# Patient Record
Sex: Male | Born: 1990 | Race: White | Hispanic: No | Smoking: Current every day smoker
Health system: Southern US, Community
[De-identification: ages and names within clinical notes are randomized; demographics above are authoritative.]

## PROBLEM LIST (undated history)

## (undated) DIAGNOSIS — D649 Anemia, unspecified: Secondary | ICD-10-CM

## (undated) DIAGNOSIS — I10 Essential (primary) hypertension: Secondary | ICD-10-CM

## (undated) DIAGNOSIS — L732 Hidradenitis suppurativa: Secondary | ICD-10-CM

## (undated) HISTORY — DX: Hidradenitis suppurativa: L73.2

## (undated) HISTORY — PX: OTHER SURGICAL HISTORY: SHX169

## (undated) HISTORY — PX: VASECTOMY: SHX75

## (undated) MED FILL — Iron Sucrose Inj 20 MG/ML (Fe Equiv): INTRAVENOUS | Qty: 10 | Status: AC

---

## 2016-03-06 ENCOUNTER — Encounter: Payer: Self-pay | Admitting: Emergency Medicine

## 2016-03-06 ENCOUNTER — Emergency Department: Payer: Self-pay

## 2016-03-06 ENCOUNTER — Emergency Department
Admission: EM | Admit: 2016-03-06 | Discharge: 2016-03-06 | Disposition: A | Discharge status: Home / Self Care | Payer: Self-pay | Attending: Emergency Medicine | Admitting: Emergency Medicine

## 2016-03-06 DIAGNOSIS — I10 Essential (primary) hypertension: Secondary | ICD-10-CM | POA: Insufficient documentation

## 2016-03-06 DIAGNOSIS — R1013 Epigastric pain: Secondary | ICD-10-CM | POA: Insufficient documentation

## 2016-03-06 DIAGNOSIS — R109 Unspecified abdominal pain: Secondary | ICD-10-CM

## 2016-03-06 DIAGNOSIS — F17210 Nicotine dependence, cigarettes, uncomplicated: Secondary | ICD-10-CM | POA: Insufficient documentation

## 2016-03-06 HISTORY — DX: Essential (primary) hypertension: I10

## 2016-03-06 LAB — COMPREHENSIVE METABOLIC PANEL
ALBUMIN: 3.9 g/dL (ref 3.5–5.0)
ALT: 18 U/L (ref 17–63)
ANION GAP: 5 (ref 5–15)
AST: 45 U/L — AB (ref 15–41)
Alkaline Phosphatase: 39 U/L (ref 38–126)
BILIRUBIN TOTAL: 0.3 mg/dL (ref 0.3–1.2)
BUN: 14 mg/dL (ref 6–20)
CO2: 28 mmol/L (ref 22–32)
Calcium: 10 mg/dL (ref 8.9–10.3)
Chloride: 104 mmol/L (ref 101–111)
Creatinine, Ser: 1.1 mg/dL (ref 0.61–1.24)
GFR calc Af Amer: >60 mL/min (ref >60)
GFR calc non Af Amer: >60 mL/min (ref >60)
GLUCOSE: 103 mg/dL — AB (ref 65–99)
POTASSIUM: 4.4 mmol/L (ref 3.5–5.1)
SODIUM: 137 mmol/L (ref 135–145)
Total Protein: 7.6 g/dL (ref 6.5–8.1)

## 2016-03-06 LAB — CBC
HEMATOCRIT: 40.4 % (ref 40.0–52.0)
HEMOGLOBIN: 13.2 g/dL (ref 13.0–18.0)
MCH: 23.6 pg — AB (ref 26.0–34.0)
MCHC: 32.7 g/dL (ref 32.0–36.0)
MCV: 72.2 fL — AB (ref 80.0–100.0)
Platelets: 265 10*3/uL (ref 150–440)
RBC: 5.6 MIL/uL (ref 4.40–5.90)
RDW: 18.2 % — AB (ref 11.5–14.5)
WBC: 9.1 10*3/uL (ref 3.8–10.6)

## 2016-03-06 LAB — LIPASE, BLOOD: Lipase: 26 U/L (ref 11–51)

## 2016-03-06 LAB — TROPONIN I: Troponin I: <0.03 ng/mL (ref <0.031)

## 2016-03-06 MED ORDER — FAMOTIDINE 20 MG PO TABS
40.0000 mg | ORAL_TABLET | Freq: Once | ORAL | Status: AC
  Administered 2016-03-06: 40 mg via ORAL
  Filled 2016-03-06: qty 2

## 2016-03-06 MED ORDER — IBUPROFEN 600 MG PO TABS
600.0000 mg | ORAL_TABLET | ORAL | Status: AC
  Administered 2016-03-06: 600 mg via ORAL
  Filled 2016-03-06: qty 1

## 2016-03-06 MED ORDER — ACETAMINOPHEN 500 MG PO TABS
1000.0000 mg | ORAL_TABLET | ORAL | Status: AC
  Administered 2016-03-06: 1000 mg via ORAL
  Filled 2016-03-06: qty 2

## 2016-03-06 NOTE — ED Notes (Signed)
Was driving to gas station this am, had a sharp epigastric pain, does have gallbladder, states he has been diagnosed with GERD in the past, this pain is different.

## 2016-03-06 NOTE — Discharge Instructions (Signed)
Please start taking 20 mg of over-the-counter Nexium daily for the next 2 weeks.  You were seen in the emergency room for abdominal pain. It is important that you follow up closely with your primary care doctor or gastroentrology in the next couple of days.   Please return to the emergency room right away if you are to develop a fever, severe nausea, your pain becomes severe or worsens, you are unable to keep food down, begin vomiting any dark or bloody fluid, you develop any dark or bloody stools, feel dehydrated, or other new concerns or symptoms arise.   Abdominal Pain, Adult Many things can cause abdominal pain. Usually, abdominal pain is not caused by a disease and will improve without treatment. It can often be observed and treated at home. Your health care provider will do a physical exam and possibly order blood tests and X-rays to help determine the seriousness of your pain. However, in many cases, more time must pass before a clear cause of the pain can be found. Before that point, your health care provider may not know if you need more testing or further treatment. HOME CARE INSTRUCTIONS Monitor your abdominal pain for any changes. The following actions may help to alleviate any discomfort you are experiencing:  Only take over-the-counter or prescription medicines as directed by your health care provider.  Do not take laxatives unless directed to do so by your health care provider.  Try a clear liquid diet (broth, tea, or water) as directed by your health care provider. Slowly move to a bland diet as tolerated. SEEK MEDICAL CARE IF:  You have unexplained abdominal pain.  You have abdominal pain associated with nausea or diarrhea.  You have pain when you urinate or have a bowel movement.  You experience abdominal pain that wakes you in the night.  You have abdominal pain that is worsened or improved by eating food.  You have abdominal pain that is worsened with eating fatty  foods.  You have a fever. SEEK IMMEDIATE MEDICAL CARE IF:  Your pain does not go away within 2 hours.  You keep throwing up (vomiting).  Your pain is felt only in portions of the abdomen, such as the right side or the left lower portion of the abdomen.  You pass bloody or black tarry stools. MAKE SURE YOU:  Understand these instructions.  Will watch your condition.  Will get help right away if you are not doing well or get worse.   This information is not intended to replace advice given to you by your health care provider. Make sure you discuss any questions you have with your health care provider.   Document Released: 07/14/2005 Document Revised: 06/25/2015 Document Reviewed: 06/13/2013 Elsevier Interactive Patient Education [employer]2016 [employer]vier Inc.

## 2016-03-06 NOTE — ED Notes (Signed)
Discussed discharge instructions and follow-up care with patient. No questions or concerns at this time. Pt stable at discharge.  

## 2016-03-06 NOTE — ED Notes (Signed)
States epigastric pain x 1 month, intermittent, denies vomiting, states worse if he eats.

## 2016-03-06 NOTE — ED Provider Notes (Signed)
[facility] Emergency Department Provider Note  ____________________________________________  Time seen: Approximately 2:38 PM  I have reviewed the triage vital signs and the nursing notes.   HISTORY  Chief Complaint Abdominal Pain    HPI Justin Powers is a 25 y.o. male denies previous medical condition other than a history of previous hypertension.  For about the last month he is noticed pain in his upper abdomen, that will come and go and usually lasts for about an hour or 2. He seems to think the pain is associated more with eating, but sometimes will car was at work without eating. Does not radiate. No fevers or chills. No nausea or vomiting. Normal bowel movements.  Today states he was driving, when the pain came back he describes a fairly sharp pain just in the upper midportion of the abdomen a little bit under the right side of the ribs. Pain is currently gone away, however he does report at this time he has a mild headache and states he gets headaches from time to time over the last several months to years and would usually use Tylenol or ibuprofen for this. This is nothing new or of concern.   Past Medical History  Diagnosis Date  . Hypertension     There are no active problems to display for this patient.   History reviewed. No pertinent past surgical history.  No current outpatient prescriptions on file.  Allergies Review of patient's allergies indicates no known allergies.  No family history on file.  Social History Social History  Substance Use Topics  . Smoking status: Current Every Day Smoker -- 0.50 packs/day    Types: Cigarettes  . Smokeless tobacco: None  . Alcohol Use: No    Review of Systems Constitutional: No fever/chills Eyes: No visual changes. ENT: No sore throat. Cardiovascular: Denies chest pain. Respiratory: Denies shortness of breath. Gastrointestinal: No nausea, no vomiting.  No diarrhea.  No  constipation. Genitourinary: Negative for dysuria. Musculoskeletal: Negative for back pain. Skin: Negative for rash. Neurological: Negative for focal weakness or numbness.  No testicular pain or groin pain.  10-point ROS otherwise negative.  ____________________________________________   PHYSICAL EXAM:  VITAL SIGNS: ED Triage Vitals  Enc Vitals Group     BP 03/06/16 1229 142/87 mmHg     Pulse Rate 03/06/16 1229 85     Resp 03/06/16 1229 20     Temp 03/06/16 1229 98.3 F (36.8 C)     Temp Source 03/06/16 1229 Oral     SpO2 03/06/16 1229 98 %     Weight 03/06/16 1229 170 lb (77.111 kg)     Height 03/06/16 1229  (1.702 m)     Head Cir --      Peak Flow --      Pain Score 03/06/16 1231 4     Pain Loc --      Pain Edu? --      Excl. in GC? --    Constitutional: Alert and oriented. Well appearing and in no acute distress. Eyes: Conjunctivae are normal. PERRL. EOMI. Head: Atraumatic. Nose: No congestion/rhinnorhea. Mouth/Throat: Mucous membranes are moist.  Oropharynx non-erythematous. Neck: No stridor.   Cardiovascular: Normal rate, regular rhythm. Grossly normal heart sounds.  Good peripheral circulation. Respiratory: Normal respiratory effort.  No retractions. Lungs CTAB. Gastrointestinal: Soft and nontender. Negative Murphy. No distention. No abdominal bruits. No CVA tenderness. Musculoskeletal: No lower extremity tenderness nor edema.  No joint effusions. Neurologic:  Normal speech and language. No  gross focal neurologic deficits are appreciated. Skin:  Skin is warm, dry and intact. No rash noted. Psychiatric: Mood and affect are normal. Speech and behavior are normal.  ____________________________________________   LABS (all labs ordered are listed, but only abnormal results are displayed)  Labs Reviewed  COMPREHENSIVE METABOLIC PANEL - Abnormal; Notable for the following:    Glucose, Bld 103 (*)    AST 45 (*)    All other components within normal limits   CBC - Abnormal; Notable for the following:    MCV 72.2 (*)    MCH 23.6 (*)    RDW 18.2 (*)    All other components within normal limits  LIPASE, BLOOD  TROPONIN I   ____________________________________________  EKG  ED ECG REPORT I, QUALE, MARK, the attending physician, personally viewed and interpreted this ECG.  Date: 03/06/2016 EKG Time: 1240 Rate: 90 Rhythm: normal sinus rhythm QRS Axis: normal Intervals: normal ST/T Wave abnormalities: normal Conduction Disturbances: none Narrative Interpretation: unremarkable  ____________________________________________  RADIOLOGY  US Abdomen Limited RUQ (Final result) Result time: 03/06/16 15:19:10   Final result by Rad Results In Interface (03/06/16 15:19:10)   Narrative:   CLINICAL DATA: Mid epigastric and right upper quadrant pain for 1 month  EXAM: US ABDOMEN LIMITED - RIGHT UPPER QUADRANT  COMPARISON: None.  FINDINGS: Gallbladder:  No gallstones or wall thickening visualized. No sonographic Murphy sign noted by sonographer.  Common bile duct:  Diameter: 3.1 mm  Liver:  No focal lesion identified. Within normal limits in parenchymal echogenicity.  IMPRESSION: Normal. No cause for pain identified.   Electronically Signed By: Gerome Samavid Williams III M.D On: 03/06/2016 15:19     ____________________________________________   PROCEDURES  Procedure(s) performed: None  Critical Care performed: No  ____________________________________________   INITIAL IMPRESSION / ASSESSMENT AND PLAN / ED COURSE  Pertinent labs & imaging results that were available during my care of the patient were reviewed by me and considered in my medical decision making (see chart for details).  Differential diagnosis includes but is not limited to, abdominal perforation, aortic dissection, cholecystitis, appendicitis, diverticulitis, colitis, esophagitis/gastritis, kidney stone, pyelonephritis, urinary tract  infection, aortic aneurysm. All are considered in decision and treatment plan. Based upon the patient's presentation and risk factors, and the patient's complete relief of abdominal symptoms at this time with reassuring exam we'll proceed with ultrasound of the right upper quadrant to evaluate for possible biliary or pancreatic disease. Biliary colic certainly high my differential, but certainly also other causes such as pancreatitis, esophageal reflux are all strongly considered. There is no evidence for obvious acute peritonitis, surgical abdomen, or cause for concern for appendicitis as there is no right lower quadrant discomfort, fever, leukocytosis, or peritonitis on exam. Patient denies and has no cardiac or pulmonary symptoms.  ----------------------------------------- 3:44 PM on 03/06/2016 -----------------------------------------  Patient resting comfortably with no concerns at this time.  Return precautions and treatment recommendations and follow-up discussed with the patient who is agreeable with the plan.   ____________________________________________   FINAL CLINICAL IMPRESSION(S) / ED DIAGNOSES  Final diagnoses:  Abdominal pain  Epigastric abdominal pain      Sharyn CreamerMark Quale, MD 03/06/16 1544

## 2018-02-28 ENCOUNTER — Emergency Department
Admission: EM | Admit: 2018-02-28 | Discharge: 2018-02-28 | Disposition: A | Discharge status: Home / Self Care | Payer: 59 | Attending: Emergency Medicine | Admitting: Emergency Medicine

## 2018-02-28 ENCOUNTER — Other Ambulatory Visit: Payer: Self-pay

## 2018-02-28 ENCOUNTER — Encounter: Payer: Self-pay | Admitting: Emergency Medicine

## 2018-02-28 DIAGNOSIS — J029 Acute pharyngitis, unspecified: Secondary | ICD-10-CM | POA: Diagnosis not present

## 2018-02-28 DIAGNOSIS — F17210 Nicotine dependence, cigarettes, uncomplicated: Secondary | ICD-10-CM | POA: Insufficient documentation

## 2018-02-28 DIAGNOSIS — I10 Essential (primary) hypertension: Secondary | ICD-10-CM | POA: Insufficient documentation

## 2018-02-28 DIAGNOSIS — R070 Pain in throat: Secondary | ICD-10-CM | POA: Diagnosis present

## 2018-02-28 LAB — GROUP A STREP BY PCR: Group A Strep by PCR: NOT DETECTED

## 2018-02-28 MED ORDER — LIDOCAINE VISCOUS HCL 2 % MT SOLN: 10.0000 mL | OROMUCOSAL | 0 refills | Status: DC | PRN

## 2018-02-28 NOTE — ED Provider Notes (Signed)
[facility] Emergency Department Provider Note  ____________________________________________  Time seen: Approximately 1:19 PM  I have reviewed the triage vital signs and the nursing notes.   HISTORY  Chief Complaint Sore Throat    HPI Justin Powers is a 27 y.o. male that presents to the emergency department for evaluation of sore throat for 1 day.  Pain is worse on the right side.  Patient has had chills but unsure of fever.  No sick contacts.  No nausea, vomiting, abdominal pain.   Past Medical History:  Diagnosis Date  . Hypertension     There are no active problems to display for this patient.   History reviewed. No pertinent surgical history.  Prior to Admission medications   Medication Sig Start Date End Date Taking? Authorizing Provider  lidocaine (XYLOCAINE) 2 % solution Use as directed 10 mLs in the mouth or throat as needed for mouth pain. 02/28/18   Enid Derry, PA-C    Allergies Patient has no known allergies.  No family history on file.  Social History Social History   Tobacco Use  . Smoking status: Current Every Day Smoker    Packs/day: 0.50    Types: Cigarettes  . Smokeless tobacco: Never Used  Substance Use Topics  . Alcohol use: No  . Drug use: Not on file     Review of Systems ENT: Negative for congestion and rhinorrhea. Cardiovascular: No chest pain. Respiratory:  No SOB. Gastrointestinal: No abdominal pain.  No nausea, no vomiting.  Musculoskeletal: Negative for musculoskeletal pain. Skin: Negative for rash, abrasions, lacerations, ecchymosis. Neurological: Negative for headaches.   ____________________________________________   PHYSICAL EXAM:  VITAL SIGNS: ED Triage Vitals  Enc Vitals Group     BP 02/28/18 1210 124/82     Pulse Rate 02/28/18 1210 90     Resp 02/28/18 1210 18     Temp 02/28/18 1210 98.6 F (37 C)     Temp Source 02/28/18 1210 Oral     SpO2 02/28/18 1210 98 %     Weight 02/28/18  1208 170 lb (77.1 kg)     Height 02/28/18 1208  (1.702 m)     Head Circumference --      Peak Flow --      Pain Score 02/28/18 1207 5     Pain Loc --      Pain Edu? --      Excl. in GC? --      Constitutional: Alert and oriented. Well appearing and in no acute distress. Eyes: Conjunctivae are normal. PERRL. EOMI. No discharge. Head: Atraumatic. ENT: No frontal and maxillary sinus tenderness.      Ears: Tympanic membranes pearly gray with good landmarks. No discharge.      Nose: No congestion/rhinnorhea.      Mouth/Throat: Mucous membranes are moist. Oropharynx erythematous. Tonsils not enlarged. No exudates. Uvula midline. Neck: No stridor.   Hematological/Lymphatic/Immunilogical: No cervical lymphadenopathy. Cardiovascular: Normal rate, regular rhythm.  Good peripheral circulation. Respiratory: Normal respiratory effort without tachypnea or retractions. Lungs CTAB. Good air entry to the bases with no decreased or absent breath sounds. Gastrointestinal: Bowel sounds 4 quadrants. Soft and nontender to palpation. No guarding or rigidity. No palpable masses. No distention. Musculoskeletal: Full range of motion to all extremities. No gross deformities appreciated. Neurologic:  Normal speech and language. No gross focal neurologic deficits are appreciated.  Skin:  Skin is warm, dry and intact. No rash noted. Psychiatric: Mood and affect are normal. Speech and behavior are  normal. Patient exhibits appropriate insight and judgement.   ____________________________________________   LABS (all labs ordered are listed, but only abnormal results are displayed)  Labs Reviewed  GROUP A STREP BY PCR   ____________________________________________  EKG   ____________________________________________  RADIOLOGY   No results found.  ____________________________________________    PROCEDURES  Procedure(s) performed:    Procedures    Medications - No data to  display   ____________________________________________   INITIAL IMPRESSION / ASSESSMENT AND PLAN / ED COURSE  Pertinent labs & imaging results that were available during my care of the patient were reviewed by me and considered in my medical decision making (see chart for details).  Review of the [place] CSRS was performed in accordance of the [facility] prior to dispensing any controlled drugs.   Patient's diagnosis is consistent with viral pharyngitis. Vital signs and exam are reassuring.  Strep test is negative.  Patient appears well and is staying well hydrated. Patient feels comfortable going home. Patient will be discharged home with prescriptions for viscous lidocaine. . Patient is to follow up with PCP as needed or otherwise directed. Patient is given ED precautions to return to the ED for any worsening or new symptoms.     ____________________________________________  FINAL CLINICAL IMPRESSION(S) / ED DIAGNOSES  Final diagnoses:  Viral pharyngitis      NEW MEDICATIONS STARTED DURING THIS VISIT:  ED Discharge Orders        Ordered    lidocaine (XYLOCAINE) 2 % solution  As needed     02/28/18 1353          This chart was dictated using voice recognition software/Dragon. Despite best efforts to proofread, errors can occur which can change the meaning. Any change was purely unintentional.    Enid Derry, PA-C 02/28/18 1548    Minna Antis, MD 03/01/18 1102

## 2018-02-28 NOTE — ED Notes (Signed)
See triage note  Developed sore throat yesterday  Having increased pain with swallowing  Denies any fever at home and is afebrile on arrival

## 2018-02-28 NOTE — ED Triage Notes (Signed)
Sore throat since yesterday with white spots on back of throat per pt. Has hx of strep.

## 2018-05-21 ENCOUNTER — Encounter: Payer: Self-pay | Admitting: Emergency Medicine

## 2018-05-21 ENCOUNTER — Emergency Department: Payer: 59

## 2018-05-21 ENCOUNTER — Emergency Department
Admission: EM | Admit: 2018-05-21 | Discharge: 2018-05-21 | Disposition: A | Discharge status: Home / Self Care | Payer: 59 | Attending: Emergency Medicine | Admitting: Emergency Medicine

## 2018-05-21 DIAGNOSIS — I10 Essential (primary) hypertension: Secondary | ICD-10-CM | POA: Diagnosis not present

## 2018-05-21 DIAGNOSIS — F17210 Nicotine dependence, cigarettes, uncomplicated: Secondary | ICD-10-CM | POA: Diagnosis not present

## 2018-05-21 DIAGNOSIS — M545 Low back pain, unspecified: Secondary | ICD-10-CM

## 2018-05-21 MED ORDER — HYDROCODONE-ACETAMINOPHEN 5-325 MG PO TABS: 1.0000 | ORAL_TABLET | Freq: Four times a day (QID) | ORAL | 0 refills | Status: AC | PRN

## 2018-05-21 MED ORDER — HYDROCODONE-ACETAMINOPHEN 5-325 MG PO TABS
1.0000 | ORAL_TABLET | Freq: Once | ORAL | Status: AC
  Administered 2018-05-21: 1 via ORAL
  Filled 2018-05-21: qty 1

## 2018-05-21 MED ORDER — CYCLOBENZAPRINE HCL 10 MG PO TABS: 10.0000 mg | ORAL_TABLET | Freq: Three times a day (TID) | ORAL | 0 refills | Status: DC | PRN

## 2018-05-21 MED ORDER — PREDNISONE 10 MG (21) PO TBPK: ORAL_TABLET | ORAL | 0 refills | Status: DC

## 2018-05-21 NOTE — ED Triage Notes (Signed)
C/O right lower back pain x 2 weeks.

## 2018-05-21 NOTE — ED Notes (Signed)
Pt c/o mid back pain radiating down through both hips states he has had the pain for about 2 weeks, pt has tried goody powder, massage, tylenol, CBD lotions but no improvement.  Pt [place] 8[place]20 [place], but states the pain is too bad he cannot wait.

## 2018-05-21 NOTE — ED Notes (Signed)
Pt transported to XR.  

## 2018-05-21 NOTE — Discharge Instructions (Addendum)
Please follow up with Dr. Lucila Maineramm as scheduled. Return to the ER for symptoms that change or worsen if unable to schedule an appointment.

## 2018-05-21 NOTE — ED Provider Notes (Signed)
[facility] Emergency Department Provider Note ____________________________________________  Time seen: Approximately 3:07 PM  I have reviewed the triage vital signs and the nursing notes.  HISTORY  Chief Complaint Back Pain   HPI Vanessa RalphsRobert Fredrick is a 27 y.o. male who presents to the emergency department for treatment and evaluation of back pain x 2 weeks. No relief with OTC medications, massage. He has an appointment with Dr. Lucila Maineramm on June 06, 2018 but pain is too bad to wait. He has "always had some back pain" but this is different because it radiates into his left hip and buttock.  Past Medical History:  Diagnosis Date  . Hypertension     There are no active problems to display for this patient.   History reviewed. No pertinent surgical history.  Prior to Admission medications   Medication Sig Start Date End Date Taking? Authorizing Provider  cyclobenzaprine (FLEXERIL) 10 MG tablet Take 1 tablet (10 mg total) by mouth 3 (three) times daily as needed for muscle spasms. 05/21/18   Clemence Stillings, Rulon Eisenmengerari B, FNP  HYDROcodone-acetaminophen (NORCO/VICODIN) 5-325 MG tablet Take 1 tablet by mouth every 6 (six) hours as needed for up to 3 days for severe pain. 05/21/18 05/24/18  Rami Waddle, Rulon Eisenmengerari B, FNP  lidocaine (XYLOCAINE) 2 % solution Use as directed 10 mLs in the mouth or throat as needed for mouth pain. 02/28/18   Enid DerryWagner, Ashley, PA-C  predniSONE (STERAPRED UNI-PAK 21 TAB) 10 MG (21) TBPK tablet Take 6 tablets on the first day and decrease by 1 tablet each day until finished. 05/21/18   Chinita Pesterriplett, Lochlan Grygiel B, FNP    Allergies Patient has no known allergies.  No family history on file.  Social History Social History   Tobacco Use  . Smoking status: Current Every Day Smoker    Packs/day: 0.50    Types: Cigarettes  . Smokeless tobacco: Never Used  Substance Use Topics  . Alcohol use: No  . Drug use: Not on file    Review of Systems Constitutional: Well  appearing. Respiratory: Negative for dyspnea. Cardiovascular: Negative for change in skin temperature or color. Musculoskeletal:   Negative for chronic steroid use   Negative for trauma in the presence of osteoporosis  Negative for age over 6450 and trauma.  Negative for constitutional symptoms, or history of cancer.  Negative for pain worse at night. Skin: Negative for rash, lesion, or wound.  Genitourinary: Negative for urinary retention. Rectal: Negative for fecal incontinence or new onset constipation/bowel habit changes. Hematological/Immunilogical: Negative for immunosuppression, IV drug use, or fever Neurological: Negative for burning, tingling, numb, electric, radiating pain in the right or left leg.                        Negative for saddle anesthesia.                        Negative for focal neurologic deficit, progressive or disabling symptoms             Negative for saddle anesthesia. ____________________________________________   PHYSICAL EXAM:  VITAL SIGNS: ED Triage Vitals  Enc Vitals Group     BP 05/21/18 1425 127/75     Pulse Rate 05/21/18 1425 60     Resp 05/21/18 1425 18     Temp 05/21/18 1425 99.6 F (37.6 C)     Temp Source 05/21/18 1425 Oral     SpO2 05/21/18 1425 100 %  Weight 05/21/18 1415 170 lb (77.1 kg)     Height 05/21/18 1425 5\' 8"  (1.727 m)     Head Circumference --      Peak Flow --      Pain Score 05/21/18 1415 6     Pain Loc --      Pain Edu? --      Excl. in GC? --     Constitutional: Alert and oriented. Well appearing and in no acute distress. Eyes: Conjunctivae are clear without discharge or drainage.  Head: Atraumatic. Neck: Full, active range of motion. Respiratory: Respirations even and unlabored. Musculoskeletal: Limited ROM of the back and extremities, Pain increases with flexion of the lumbar spine. Strength 5/5 of the lower extremities as tested. Neurologic: Reflexes of the lower extremities are 2+. Negative straight leg  raise on the right and left side. Skin: Atraumatic.  Psychiatric: Behavior and affect are normal.  ____________________________________________   LABS (all labs ordered are listed, but only abnormal results are displayed)  Labs Reviewed - No data to display ____________________________________________  RADIOLOGY  No acute bony abnormality of the lumbar spine per radiology. ____________________________________________   PROCEDURES  Procedure(s) performed:  Procedures ____________________________________________   INITIAL IMPRESSION / ASSESSMENT AND PLAN / ED COURSE  Jayten Gabbard is a 27 y.o. male who presents to the emergency department for evaluation of low back pain. No relief with OTC NSAIDs. Will give Norco and get lumbar image.   ----------------------------------------- 4:10 PM on 05/21/2018 ----------------------------------------- X-ray is reassuring.  Patient will be treated with prednisone, Norco, and Flexeril.  He is to keep his appointment with neurology as scheduled.  He is to return to the emergency department for symptoms of change or worsen if he is unable to schedule an appointment with his primary care provider or get an earlier appointment with a specialist.   Medications  HYDROcodone-acetaminophen (NORCO/VICODIN) 5-325 MG per tablet 1 tablet (1 tablet Oral Given 05/21/18 1554)    ED Discharge Orders        Ordered    predniSONE (STERAPRED UNI-PAK 21 TAB) 10 MG (21) TBPK tablet     05/21/18 1612    HYDROcodone-acetaminophen (NORCO/VICODIN) 5-325 MG tablet  Every 6 hours PRN     05/21/18 1612    cyclobenzaprine (FLEXERIL) 10 MG tablet  3 times daily PRN     05/21/18 1612       Pertinent labs & imaging results that were available during my care of the patient were reviewed by me and considered in my medical decision making (see chart for details).  _________________________________________   FINAL CLINICAL IMPRESSION(S) / ED DIAGNOSES  Final  diagnoses:  Acute lumbar back pain  Acute left-sided low back pain without sciatica     If controlled substance prescribed during this visit, 12 month history viewed on the [facility] prior to issuing an initial prescription for Schedule II or III opiod.    Chinita Pester, FNP 05/21/18 1649    Phineas Semen, MD 05/21/18 [phone]

## 2018-08-29 ENCOUNTER — Other Ambulatory Visit: Payer: Self-pay

## 2018-08-29 ENCOUNTER — Emergency Department: Payer: 59

## 2018-08-29 ENCOUNTER — Encounter: Payer: Self-pay | Admitting: Emergency Medicine

## 2018-08-29 DIAGNOSIS — D649 Anemia, unspecified: Secondary | ICD-10-CM | POA: Diagnosis not present

## 2018-08-29 DIAGNOSIS — R079 Chest pain, unspecified: Secondary | ICD-10-CM | POA: Insufficient documentation

## 2018-08-29 DIAGNOSIS — I10 Essential (primary) hypertension: Secondary | ICD-10-CM | POA: Insufficient documentation

## 2018-08-29 DIAGNOSIS — F17210 Nicotine dependence, cigarettes, uncomplicated: Secondary | ICD-10-CM | POA: Insufficient documentation

## 2018-08-29 LAB — CBC
HEMATOCRIT: 35.9 % — AB (ref 39.0–52.0)
Hemoglobin: 10.6 g/dL — ABNORMAL LOW (ref 13.0–17.0)
MCH: 19.7 pg — AB (ref 26.0–34.0)
MCHC: 29.5 g/dL — AB (ref 30.0–36.0)
MCV: 66.6 fL — AB (ref 80.0–100.0)
Platelets: 331 10*3/uL (ref 150–400)
RBC: 5.39 MIL/uL (ref 4.22–5.81)
RDW: 20.6 % — ABNORMAL HIGH (ref 11.5–15.5)
WBC: 11.8 10*3/uL — ABNORMAL HIGH (ref 4.0–10.5)
nRBC: 0 % (ref 0.0–0.2)

## 2018-08-29 LAB — BASIC METABOLIC PANEL
Anion gap: 11 (ref 5–15)
BUN: 16 mg/dL (ref 6–20)
CHLORIDE: 106 mmol/L (ref 98–111)
CO2: 23 mmol/L (ref 22–32)
CREATININE: 1.01 mg/dL (ref 0.61–1.24)
Calcium: 9.6 mg/dL (ref 8.9–10.3)
GFR calc Af Amer: >60 mL/min (ref >60)
GFR calc non Af Amer: >60 mL/min (ref >60)
Glucose, Bld: 117 mg/dL — ABNORMAL HIGH (ref 70–99)
Potassium: 3.1 mmol/L — ABNORMAL LOW (ref 3.5–5.1)
SODIUM: 140 mmol/L (ref 135–145)

## 2018-08-29 LAB — TROPONIN I: Troponin I: <0.03 ng/mL (ref <0.03)

## 2018-08-29 NOTE — ED Triage Notes (Signed)
Pt presents to ED with sudden onset of mid chest pressure with sob while walking around at work. Pt reports hx of similar symptoms several times in the past month but pt has not been evaluated by an MD. Denies hx of asthma. No recent illness.

## 2018-08-30 ENCOUNTER — Emergency Department
Admission: EM | Admit: 2018-08-30 | Discharge: 2018-08-30 | Disposition: A | Discharge status: Home / Self Care | Payer: 59 | Attending: Emergency Medicine | Admitting: Emergency Medicine

## 2018-08-30 ENCOUNTER — Telehealth: Payer: Self-pay

## 2018-08-30 DIAGNOSIS — D649 Anemia, unspecified: Secondary | ICD-10-CM

## 2018-08-30 DIAGNOSIS — R079 Chest pain, unspecified: Secondary | ICD-10-CM

## 2018-08-30 MED ORDER — POTASSIUM CHLORIDE 20 MEQ PO PACK
40.0000 meq | PACK | Freq: Once | ORAL | Status: AC
  Administered 2018-08-30: 40 meq via ORAL
  Filled 2018-08-30: qty 2

## 2018-08-30 MED ORDER — FERROUS SULFATE 325 (65 FE) MG PO TBEC: 325.0000 mg | DELAYED_RELEASE_TABLET | Freq: Every day | ORAL | 0 refills | Status: DC

## 2018-08-30 NOTE — Telephone Encounter (Signed)
Lmov for patient to call and schedule ED fu  Seen on 08/29/18  Will try again at a later time

## 2018-08-30 NOTE — ED Provider Notes (Signed)
[facility] Emergency Department Provider Note    First MD Initiated Contact with Patient 08/30/18 [phone]     (approximate)  I have reviewed the triage vital signs and the nursing notes.   HISTORY  Chief Complaint Chest Pain and Shortness of Breath    HPI Justin Powers is a 27 y.o. male presents to the emergency department with episode of upper "chest tightness"which occurred while the patient was at work tonight.patient states that tightness was associated with dyspnea. Patient said he has had similar episodes while at work in the past month. Patient denies any personal familial history of CAD or PE/DVT. 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 Patient denies any discomfort are present.   Past Medical History:  Diagnosis Date  . Hypertension     There are no active problems to display for this patient.   past surgical history None  Prior to Admission medications   Medication Sig Start Date End Date Taking? Authorizing Provider  cyclobenzaprine (FLEXERIL) 10 MG tablet Take 1 tablet (10 mg total) by mouth 3 (three) times daily as needed for muscle spasms. 05/21/18   Triplett, Rulon Eisenmenger B, FNP  ferrous sulfate 325 (65 FE) MG EC tablet Take 1 tablet (325 mg total) by mouth daily with breakfast. 08/30/18 08/30/19  Darci Current, MD  lidocaine (XYLOCAINE) 2 % solution Use as directed 10 mLs in the mouth or throat as needed for mouth pain. 02/28/18   Enid Derry, PA-C  predniSONE (STERAPRED UNI-PAK 21 TAB) 10 MG (21) TBPK tablet Take 6 tablets on the first day and decrease by 1 tablet each day until finished. 05/21/18   Triplett, Rulon Eisenmenger B, FNP    Allergies no known drug allergies No family history on file.  Social History Social History   Tobacco Use  . Smoking status: Current Every Day Smoker    Packs/day: 0.50    Types: Cigarettes  . Smokeless tobacco: Never Used  Substance Use Topics  .  Alcohol use: No  . Drug use: Never    Review of Systems Constitutional: No fever/chills Eyes: No visual changes. ENT: No sore throat. Cardiovascular: Denies chest pain. Respiratory: Denies shortness of breath. Gastrointestinal: No abdominal pain.  No nausea, no vomiting.  No diarrhea.  No constipation. Genitourinary: Negative for dysuria. Musculoskeletal: Negative for neck pain.  Negative for back pain. Integumentary: Negative for rash. Neurological: Negative for headaches, focal weakness or numbness.   ____________________________________________   PHYSICAL EXAM:  VITAL SIGNS: ED Triage Vitals  Enc Vitals Group     BP 08/29/18 2225 (!) 141/80     Pulse Rate 08/29/18 2225 76     Resp 08/29/18 2225 (!) 22     Temp 08/29/18 2225 97.8 F (36.6 C)     Temp Source 08/29/18 2225 Oral     SpO2 08/29/18 2225 100 %     Weight 08/29/18 2226 76.2 kg (168 lb)     Height 08/29/18 2226 1.702 m (5\' 7" )     Head Circumference --      Peak Flow --      Pain Score 08/29/18 2225 6     Pain Loc --      Pain Edu? --      Excl. in GC? --     Constitutional: Alert and oriented. Well appearing and in no acute distress. Eyes: Conjunctivae are normal.  Head: Atraumatic. Mouth/Throat: Mucous membranes are moist.  Oropharynx non-erythematous. Neck:  No stridor.   Cardiovascular: Normal rate, regular rhythm. Good peripheral circulation. Grossly normal heart sounds. Respiratory: Normal respiratory effort.  No retractions. Lungs CTAB. Gastrointestinal: Soft and nontender. No distention.  Musculoskeletal: No lower extremity tenderness nor edema. No gross deformities of extremities. Neurologic:  Normal speech and language. No gross focal neurologic deficits are appreciated.  Skin:  Skin is warm, dry and intact. No rash noted. Psychiatric: Mood and affect are normal. Speech and behavior are normal.  ____________________________________________   LABS (all labs ordered are listed, but only  abnormal results are displayed)  Labs Reviewed  BASIC METABOLIC PANEL - Abnormal; Notable for the following components:      Result Value   Potassium 3.1 (*)    Glucose, Bld 117 (*)    All other components within normal limits  CBC - Abnormal; Notable for the following components:   WBC 11.8 (*)    Hemoglobin 10.6 (*)    HCT 35.9 (*)    MCV 66.6 (*)    MCH 19.7 (*)    MCHC 29.5 (*)    RDW 20.6 (*)    All other components within normal limits  TROPONIN I   ____________________________________________  EKG  ED ECG REPORT I, [place] N BROWN, the attending physician, personally viewed and interpreted this ECG.   Date: 08/30/2018  EKG Time: 10:39 PM  Rate: 74  Rhythm: Normal sinus rhythm  Axis: normal  Intervals:normalQTC 404  ST&T Change: none  ____________________________________________  RADIOLOGY I, [place] N BROWN, personally viewed and evaluated these images (plain radiographs) as part of my medical decision making, as well as reviewing the written report by the radiologist.  ED MD interpretation:  no active cardiopulmonary disease noted on chest x-ray per radiologist.  Official radiology report(s): Dg Chest 2 View  Result Date: 08/29/2018 CLINICAL DATA:  Chest pain EXAM: CHEST - 2 VIEW COMPARISON:  None. FINDINGS: There is increased thoracic kyphosis with mild multilevel thoracic vertebral body height loss. Lungs are clear. No pleural effusion or pneumothorax. No focal airspace consolidation or pulmonary edema. IMPRESSION: No active cardiopulmonary disease. Electronically Signed   By: Deatra RobinsonKevin  Herman M.D.   On: 08/29/2018 23:01    ____________________________________________   Procedures   ____________________________________________   INITIAL IMPRESSION / ASSESSMENT AND PLAN / ED COURSE  As part of my medical decision making, I reviewed the following data within the electronic MEDICAL RECORD NUMBER  27 year old male presenting with above stated history of  physical exam secondary to upper chest tightness that is resolved. Patient states had multiple episodes while at work over the past month. Considered possibly of ACS EKG revealed no evidence of ischemia or infarction laboratory data including troponin negative. Patient with no clear risk factors for PE. Laboratory data notable for anemia with a hemoglobin of 10.6 hematocrit 35.9 MCV of 66.6 and a such concern for possible iron deficiency anemia. Patient denies any bright red blood or melenic stools. In addition patient potassium noted to be 3.1 and a such patient given 40 mEq of potassium chloride. Patient will be described iron for home with recommendation follow-up with primary care provider for further outpatient evaluation of anemia. ____________________________________________  FINAL CLINICAL IMPRESSION(S) / ED DIAGNOSES  Final diagnoses:  Chest pain, unspecified type  Anemia, unspecified type     MEDICATIONS GIVEN DURING THIS VISIT:  Medications  potassium chloride (KLOR-CON) packet 40 mEq (40 mEq Oral Given 08/30/18 0141)     ED Discharge Orders         Ordered  ferrous sulfate 325 (65 FE) MG EC tablet  Daily with breakfast,   Status:  Discontinued     08/30/18 0146    ferrous sulfate 325 (65 FE) MG EC tablet  Daily with breakfast     08/30/18 0146           Note:  This document was prepared using Dragon voice recognition software and may include unintentional dictation errors.    Darci Current, MD 08/30/18 [phone]

## 2018-09-04 NOTE — Telephone Encounter (Signed)
Patient declined at this time due to not being cardiac related but will call us back if needing to be seen in the fututre.

## 2018-09-19 ENCOUNTER — Other Ambulatory Visit: Payer: Self-pay

## 2018-09-19 ENCOUNTER — Inpatient Hospital Stay: Payer: 59 | Attending: Oncology | Admitting: Oncology

## 2018-09-19 ENCOUNTER — Encounter: Payer: Self-pay | Admitting: Oncology

## 2018-09-19 ENCOUNTER — Inpatient Hospital Stay: Payer: 59

## 2018-09-19 VITALS — BP 132/80 | HR 74 | Temp 96.9°F | Resp 18 | Ht 67.0 in | Wt 169.1 lb

## 2018-09-19 DIAGNOSIS — R531 Weakness: Secondary | ICD-10-CM | POA: Diagnosis not present

## 2018-09-19 DIAGNOSIS — R51 Headache: Secondary | ICD-10-CM | POA: Insufficient documentation

## 2018-09-19 DIAGNOSIS — K921 Melena: Secondary | ICD-10-CM | POA: Diagnosis not present

## 2018-09-19 DIAGNOSIS — D649 Anemia, unspecified: Secondary | ICD-10-CM

## 2018-09-19 DIAGNOSIS — R5383 Other fatigue: Secondary | ICD-10-CM

## 2018-09-19 DIAGNOSIS — D509 Iron deficiency anemia, unspecified: Secondary | ICD-10-CM

## 2018-09-19 DIAGNOSIS — F17210 Nicotine dependence, cigarettes, uncomplicated: Secondary | ICD-10-CM | POA: Insufficient documentation

## 2018-09-19 DIAGNOSIS — D500 Iron deficiency anemia secondary to blood loss (chronic): Secondary | ICD-10-CM | POA: Insufficient documentation

## 2018-09-19 DIAGNOSIS — I10 Essential (primary) hypertension: Secondary | ICD-10-CM | POA: Diagnosis not present

## 2018-09-19 DIAGNOSIS — Z888 Allergy status to other drugs, medicaments and biological substances status: Secondary | ICD-10-CM | POA: Diagnosis not present

## 2018-09-19 DIAGNOSIS — D72824 Basophilia: Secondary | ICD-10-CM | POA: Diagnosis not present

## 2018-09-19 DIAGNOSIS — R110 Nausea: Secondary | ICD-10-CM | POA: Diagnosis not present

## 2018-09-19 DIAGNOSIS — R0609 Other forms of dyspnea: Secondary | ICD-10-CM | POA: Insufficient documentation

## 2018-09-19 DIAGNOSIS — Z79899 Other long term (current) drug therapy: Secondary | ICD-10-CM | POA: Insufficient documentation

## 2018-09-19 LAB — LACTATE DEHYDROGENASE: LDH: 103 U/L (ref 98–192)

## 2018-09-19 LAB — CBC WITH DIFFERENTIAL/PLATELET
Abs Immature Granulocytes: 0.06 10*3/uL (ref 0.00–0.07)
BASOS ABS: 0.1 10*3/uL (ref 0.0–0.1)
Basophils Relative: 1 %
EOS PCT: 1 %
Eosinophils Absolute: 0.1 10*3/uL (ref 0.0–0.5)
HCT: 40.8 % (ref 39.0–52.0)
Hemoglobin: 12.1 g/dL — ABNORMAL LOW (ref 13.0–17.0)
Immature Granulocytes: 1 %
LYMPHS PCT: 20 %
Lymphs Abs: 1.8 10*3/uL (ref 0.7–4.0)
MCH: 21.1 pg — AB (ref 26.0–34.0)
MCHC: 29.7 g/dL — ABNORMAL LOW (ref 30.0–36.0)
MCV: 71.2 fL — ABNORMAL LOW (ref 80.0–100.0)
Monocytes Absolute: 0.6 10*3/uL (ref 0.1–1.0)
Monocytes Relative: 6 %
Neutro Abs: 6.7 10*3/uL (ref 1.7–7.7)
Neutrophils Relative %: 71 %
Platelets: 346 10*3/uL (ref 150–400)
RBC: 5.73 MIL/uL (ref 4.22–5.81)
RDW: 23.6 % — ABNORMAL HIGH (ref 11.5–15.5)
Smear Review: NORMAL
WBC: 9.3 10*3/uL (ref 4.0–10.5)
nRBC: 0 % (ref 0.0–0.2)

## 2018-09-19 LAB — RETIC PANEL
Immature Retic Fract: 16.2 % — ABNORMAL HIGH (ref 2.3–15.9)
RBC.: 5.73 MIL/uL (ref 4.22–5.81)
Retic Count, Absolute: 43.5 10*3/uL (ref 19.0–186.0)
Retic Ct Pct: 0.8 % (ref 0.4–3.1)
Reticulocyte Hemoglobin: 22.8 pg — ABNORMAL LOW (ref >27.9)

## 2018-09-19 LAB — IRON AND TIBC
Iron: 19 ug/dL — ABNORMAL LOW (ref 45–182)
Saturation Ratios: 3 % — ABNORMAL LOW (ref 17.9–39.5)
TIBC: 591 ug/dL — ABNORMAL HIGH (ref 250–450)
UIBC: 572 ug/dL

## 2018-09-19 LAB — FERRITIN: Ferritin: 4 ng/mL — ABNORMAL LOW (ref 24–336)

## 2018-09-19 NOTE — Progress Notes (Signed)
Patient here for initial visit. Pt has loose stools and nausea in the mornings.

## 2018-09-19 NOTE — Progress Notes (Signed)
Hematology/Oncology Consult note [facility] Telephone:([phone] Fax:([phone]) [phone]   Patient Care Team: Franciso Bend, NP as PCP - General (Nurse Practitioner)  REFERRING PROVIDER: Franciso Bend, NP CHIEF COMPLAINTS/REASON FOR VISIT:  Evaluation of anemia  HISTORY OF PRESENTING ILLNESS:  Justin Powers is a  27 y.o.  male with PMH listed below who was referred to me for evaluation of anemia Patient recently presented to ED after an episode of "upper chest tightness". Reports having multiple episodes for the past few months.  In ED, he had negative cardiology work up including negative EKG, troponin, hemoglobin was noted to be low at 10.6, microcytic.  He was sent home with presumed diagnosis of iron deficiency anemia and prescribed with iron supplements.  Also reports fatigue, progressively worsened lately.  He followed up with PCP and had additional work up done.  Reviewed patient's recent labs that was done at [facility] office. Labs revealed anemia with hemoglobin of 12, MCV 68, platelet count 378,000,  Differential showed increased basophils,  Iron panel showed saturation of 68%, iron 363, TIBC 537, normal TSH Reviewed patient's previous labs ordered by primary care physician's office, anemia is chronic onset , duration is since xxxx No aggravating or improving factors.  Associated signs and symptoms: Patient reports fatigue.  SOB with moderate exertion.  Denies weight loss, easy bruising, hematochezia, hemoptysis, hematuria. Context: History of GI bleeding: deneis . Review of Systems  Constitutional: Positive for fatigue. Negative for appetite change, chills, fever and unexpected weight change.  HENT:   Negative for hearing loss and voice change.   Eyes: Negative for eye problems and icterus.  Respiratory: Positive for shortness of breath. Negative for chest tightness and cough.   Cardiovascular: Negative for chest pain and leg swelling.    Gastrointestinal: Negative for abdominal distention and abdominal pain.  Endocrine: Negative for hot flashes.  Genitourinary: Negative for difficulty urinating, dysuria and frequency.   Musculoskeletal: Negative for arthralgias.  Skin: Negative for itching and rash.  Neurological: Negative for light-headedness and numbness.  Hematological: Negative for adenopathy. Does not bruise/bleed easily.  Psychiatric/Behavioral: Negative for confusion.    MEDICAL HISTORY:  Past Medical History:  Diagnosis Date  . Hypertension     SURGICAL HISTORY: Past Surgical History:  Procedure Laterality Date  . wisdon tooth removal      SOCIAL HISTORY: Social History   Socioeconomic History  . Marital status: Married    Spouse name: Not on file  . Number of children: Not on file  . Years of education: Not on file  . Highest education level: Not on file  Occupational History  . Not on file  Social Needs  . Financial resource strain: Not on file  . Food insecurity:    Worry: Not on file    Inability: Not on file  . Transportation needs:    Medical: Not on file    Non-medical: Not on file  Tobacco Use  . Smoking status: Current Every Day Smoker    Packs/day: 1.00    Types: Cigarettes  . Smokeless tobacco: Never Used  Substance and Sexual Activity  . Alcohol use: Yes  . Drug use: Never  . Sexual activity: Not on file  Lifestyle  . Physical activity:    Days per week: Not on file    Minutes per session: Not on file  . Stress: Not on file  Relationships  . Social connections:    Talks on phone: Not on file    Gets together:  Not on file    Attends religious service: Not on file    Active member of club or organization: Not on file    Attends meetings of clubs or organizations: Not on file    Relationship status: Not on file  . Intimate partner violence:    Fear of current or ex partner: Not on file    Emotionally abused: Not on file    Physically abused: Not on file    Forced  sexual activity: Not on file  Other Topics Concern  . Not on file  Social History Narrative  . Not on file    FAMILY HISTORY: Family History  Problem Relation Age of Onset  . Crohn's disease Mother   . Hypertension Mother   . Breast cancer Maternal Aunt   . Bladder Cancer Maternal Grandfather   . Cervical cancer Other   . Non-Hodgkin's lymphoma Other     ALLERGIES:  has No Known Allergies.  MEDICATIONS:  Current Outpatient Medications  Medication Sig Dispense Refill  . CHANTIX CONTINUING MONTH PAK 1 MG tablet See admin instructions.  2  . cyclobenzaprine (FLEXERIL) 10 MG tablet Take 1 tablet (10 mg total) by mouth 3 (three) times daily as needed for muscle spasms. 30 tablet 0  . ferrous sulfate 325 (65 FE) MG EC tablet Take 1 tablet (325 mg total) by mouth daily with breakfast. (Patient not taking: Reported on 09/19/2018) 60 tablet 0  . lidocaine (XYLOCAINE) 2 % solution Use as directed 10 mLs in the mouth or throat as needed for mouth pain. (Patient not taking: Reported on 09/19/2018) 100 mL 0  . predniSONE (STERAPRED UNI-PAK 21 TAB) 10 MG (21) TBPK tablet Take 6 tablets on the first day and decrease by 1 tablet each day until finished. (Patient not taking: Reported on 09/19/2018) 21 tablet 0   No current facility-administered medications for this visit.      PHYSICAL EXAMINATION: ECOG PERFORMANCE STATUS: 1 - Symptomatic but completely ambulatory Vitals:   09/19/18 1151  BP: 132/80  Pulse: 74  Resp: 18  Temp: (!) 96.9 F (36.1 C)   Filed Weights   09/19/18 1151  Weight: 169 lb 1.6 oz (76.7 kg)    Physical Exam  Constitutional: He is oriented to person, place, and time. No distress.  HENT:  Head: Normocephalic and atraumatic.  Mouth/Throat: Oropharynx is clear and moist.  Eyes: Pupils are equal, round, and reactive to light. EOM are normal. No scleral icterus.  Neck: Normal range of motion. Neck supple.  Cardiovascular: Normal rate, regular rhythm and normal heart  sounds.  Pulmonary/Chest: Effort normal. No respiratory distress. He has no wheezes.  Abdominal: Soft. Bowel sounds are normal. He exhibits no distension and no mass. There is no tenderness.  Musculoskeletal: Normal range of motion. He exhibits no edema or deformity.  Neurological: He is alert and oriented to person, place, and time. No cranial nerve deficit. Coordination normal.  Skin: Skin is warm and dry. No rash noted. No erythema. There is pallor.  Psychiatric: He has a normal mood and affect.     LABORATORY DATA:  I have reviewed the data as listed Lab Results  Component Value Date   WBC 9.3 09/19/2018   HGB 12.1 (L) 09/19/2018   HCT 40.8 09/19/2018   MCV 71.2 (L) 09/19/2018   PLT 346 09/19/2018   Recent Labs    08/29/18 2239  NA 140  K 3.1*  CL 106  CO2 23  GLUCOSE 117*  BUN 16  CREATININE 1.01  CALCIUM 9.6  GFRNONAA >60  GFRAA >60   Iron/TIBC/Ferritin/ %Sat    Component Value Date/Time   IRON 19 (L) 09/19/2018 1240   TIBC 591 (H) 09/19/2018 1240   FERRITIN 4 (L) 09/19/2018 1240   IRONPCTSAT 3 (L) 09/19/2018 1240        ASSESSMENT & PLAN:  1. Iron deficiency anemia, unspecified iron deficiency anemia type   2. Basophilia    Discussed with patient and his mother that possible causes of microcytic anemia including iron deficiency, hemoglobinopathy, etc. Will check CBC w differential, CMP,   retic panel, blood smear,  LDH, hemoglobinopathy evaluation. Iron panel done at PCP's office showed hight TIBC, also high saturation. ? Not explainable. Repeat iron/TIBC, ferritin,   Labs done at PCP's office showed neutrophilia, and Basophilia, decreased lymphocyte %, check flowcytometry.   Orders Placed This Encounter  Procedures  . CBC with Differential/Platelet    Standing Status:   Future    Number of Occurrences:   1    Standing Expiration Date:   09/20/2019  . Retic Panel    Standing Status:   Future    Number of Occurrences:   1    Standing Expiration  Date:   09/20/2019  . Iron and TIBC    Standing Status:   Future    Number of Occurrences:   1    Standing Expiration Date:   09/20/2019  . Ferritin    Standing Status:   Future    Number of Occurrences:   1    Standing Expiration Date:   09/20/2019  . BCR-ABL1 FISH    Standing Status:   Future    Number of Occurrences:   1    Standing Expiration Date:   09/20/2019  . Flow cytometry panel-leukemia/lymphoma work-up    Standing Status:   Future    Number of Occurrences:   1    Standing Expiration Date:   09/20/2019  . Lactate dehydrogenase    Standing Status:   Future    Number of Occurrences:   1    Standing Expiration Date:   09/20/2019  . Hemoglobinopathy evaluation    Standing Status:   Future    Number of Occurrences:   1    Standing Expiration Date:   09/20/2019    All questions were answered. The patient knows to call the clinic with any problems questions or concerns.  Return of visit: 2 weeks Thank you for this kind referral and the opportunity to participate in the care of this patient. A copy of today's note is routed to referring provider  Total face to face encounter time for this patient visit was . >50% of the time was  spent in counseling and coordination of care.    Rickard Patience, MD, PhD Hematology Oncology [facility] Pager- [phone] 09/19/2018

## 2018-09-20 LAB — HEMOGLOBINOPATHY EVALUATION
HGB A: 98.5 % (ref 96.4–98.8)
Hgb A2 Quant: 1.5 % — ABNORMAL LOW (ref 1.8–3.2)
Hgb C: 0 %
Hgb F Quant: 0 % (ref 0.0–2.0)
Hgb S Quant: 0 %
Hgb Variant: 0 %

## 2018-09-21 LAB — COMP PANEL: LEUKEMIA/LYMPHOMA: CLINICAL INFO: 3

## 2018-09-25 ENCOUNTER — Telehealth: Payer: Self-pay | Admitting: *Deleted

## 2018-09-25 NOTE — Telephone Encounter (Signed)
Per Dr Cathie HoopsYu, hold oral iron will give IV iron at next visit, does not feel ha is related to oral iron,still awaiting results. I attempted to contact patient and his voice mail is full and I could not leave a msg

## 2018-09-25 NOTE — Telephone Encounter (Signed)
Patient called reporting that he is having a headache and wants Dr Cathie HoopsYu to treat it. He states he is awaiting results and a future appointment and thought that since it is worse since getting the iron infusion that Dr Cathie HoopsYu would treat him instead of him having to call his pcp for it Please advise

## 2018-09-25 NOTE — Telephone Encounter (Signed)
Patient returned missed call and advised that he can stop the oral iron. He repeated this back to me

## 2018-09-25 NOTE — Telephone Encounter (Signed)
Patient is NOT on IV Iron, he takes oral iron.   Patient is not on any treatment by our office

## 2018-10-02 LAB — BCR-ABL1 FISH
CELLS COUNTED: 200
Cells Analyzed: 200

## 2018-10-04 ENCOUNTER — Inpatient Hospital Stay: Payer: 59

## 2018-10-04 ENCOUNTER — Inpatient Hospital Stay (HOSPITAL_BASED_OUTPATIENT_CLINIC_OR_DEPARTMENT_OTHER): Payer: 59 | Admitting: Oncology

## 2018-10-04 ENCOUNTER — Other Ambulatory Visit: Payer: Self-pay

## 2018-10-04 ENCOUNTER — Encounter: Payer: Self-pay | Admitting: Oncology

## 2018-10-04 VITALS — BP 140/84 | HR 80 | Temp 96.4°F | Resp 18 | Wt 171.9 lb

## 2018-10-04 DIAGNOSIS — I10 Essential (primary) hypertension: Secondary | ICD-10-CM

## 2018-10-04 DIAGNOSIS — Z888 Allergy status to other drugs, medicaments and biological substances status: Secondary | ICD-10-CM

## 2018-10-04 DIAGNOSIS — R51 Headache: Secondary | ICD-10-CM

## 2018-10-04 DIAGNOSIS — R519 Headache, unspecified: Secondary | ICD-10-CM

## 2018-10-04 DIAGNOSIS — R110 Nausea: Secondary | ICD-10-CM

## 2018-10-04 DIAGNOSIS — R0609 Other forms of dyspnea: Secondary | ICD-10-CM

## 2018-10-04 DIAGNOSIS — K922 Gastrointestinal hemorrhage, unspecified: Secondary | ICD-10-CM

## 2018-10-04 DIAGNOSIS — D500 Iron deficiency anemia secondary to blood loss (chronic): Secondary | ICD-10-CM

## 2018-10-04 DIAGNOSIS — Z79899 Other long term (current) drug therapy: Secondary | ICD-10-CM

## 2018-10-04 DIAGNOSIS — D72824 Basophilia: Secondary | ICD-10-CM | POA: Diagnosis not present

## 2018-10-04 DIAGNOSIS — R531 Weakness: Secondary | ICD-10-CM

## 2018-10-04 DIAGNOSIS — D649 Anemia, unspecified: Secondary | ICD-10-CM

## 2018-10-04 DIAGNOSIS — R5383 Other fatigue: Secondary | ICD-10-CM

## 2018-10-04 DIAGNOSIS — R5382 Chronic fatigue, unspecified: Secondary | ICD-10-CM

## 2018-10-04 DIAGNOSIS — F17210 Nicotine dependence, cigarettes, uncomplicated: Secondary | ICD-10-CM

## 2018-10-04 LAB — CBC WITH DIFFERENTIAL/PLATELET
Abs Immature Granulocytes: 0.02 10*3/uL (ref 0.00–0.07)
Basophils Absolute: 0.1 10*3/uL (ref 0.0–0.1)
Basophils Relative: 1 %
Eosinophils Absolute: 0.3 10*3/uL (ref 0.0–0.5)
Eosinophils Relative: 4 %
HCT: 42.4 % (ref 39.0–52.0)
Hemoglobin: 12.4 g/dL — ABNORMAL LOW (ref 13.0–17.0)
Immature Granulocytes: 0 %
Lymphocytes Relative: 25 %
Lymphs Abs: 1.8 10*3/uL (ref 0.7–4.0)
MCH: 21.5 pg — ABNORMAL LOW (ref 26.0–34.0)
MCHC: 29.2 g/dL — ABNORMAL LOW (ref 30.0–36.0)
MCV: 73.6 fL — ABNORMAL LOW (ref 80.0–100.0)
MONOS PCT: 7 %
Monocytes Absolute: 0.5 10*3/uL (ref 0.1–1.0)
NEUTROS PCT: 63 %
Neutro Abs: 4.6 10*3/uL (ref 1.7–7.7)
Platelets: 234 10*3/uL (ref 150–400)
RBC: 5.76 MIL/uL (ref 4.22–5.81)
RDW: 25 % — ABNORMAL HIGH (ref 11.5–15.5)
WBC: 7.3 10*3/uL (ref 4.0–10.5)
nRBC: 0 % (ref 0.0–0.2)

## 2018-10-04 LAB — SAMPLE TO BLOOD BANK

## 2018-10-04 NOTE — Progress Notes (Signed)
Hematology/Oncology follow up note [facility] [phone] Fax:([phone]) [phone]   Patient Care Team: Franciso Bend, NP as PCP - General (Nurse Practitioner)  REFERRING PROVIDER: Franciso Bend, NP CHIEF COMPLAINTS/REASON FOR VISIT:  Evaluation of anemia  HISTORY OF PRESENTING ILLNESS:  Justin Powers is a  27 y.o.  male with PMH listed below who was referred to me for evaluation of anemia Patient recently presented to ED after an episode of "upper chest tightness". Reports having multiple episodes for the past few months.  In ED, he had negative cardiology work up including negative EKG, troponin, hemoglobin was noted to be low at 10.6, microcytic.  He was sent home with presumed diagnosis of iron deficiency anemia and prescribed with iron supplements.  Also reports fatigue, progressively worsened lately.  He followed up with PCP and had additional work up done.  Reviewed patient's recent labs that was done at [facility] office. Labs revealed anemia with hemoglobin of 12, MCV 68, platelet count 378,000,  Differential showed increased basophils,  Iron panel showed saturation of 68%, iron 363, TIBC 537, normal TSH Reviewed patient's previous labs ordered by primary care physician's office, anemia is chronic onset , duration is since  No aggravating or improving factors.   INTERVAL HISTORY Justin Powers is a 27 y.o. male who has above history reviewed by me today presents for follow up visit for management of anemia.   During the interval, he was advised to take oral iron supplementation. He called and reports not able to tolerate oral iron supplements. He is advised to stop iron.  Reports feeling nauseated and also have seen blood in his stool.  Fatigue: reports worsening fatigue. Chronic onset, perisistent, worsened recently.  no associated symptoms.  Also has chronic intermittent left side headache, he used to take Tylenol and NSAIDs. For the  past month, he has had stomach discomfort, nauseated, he has stopped using NSAIDs, and only uses Tylenol as needed.  Accompanied by mother.   Review of Systems  Constitutional: Positive for fatigue. Negative for appetite change, chills, fever and unexpected weight change.  HENT:   Negative for hearing loss and voice change.   Eyes: Negative for eye problems and icterus.  Respiratory: Positive for shortness of breath. Negative for chest tightness and cough.   Cardiovascular: Negative for chest pain and leg swelling.  Gastrointestinal: Positive for abdominal pain and blood in stool. Negative for abdominal distention.  Endocrine: Negative for hot flashes.  Genitourinary: Negative for difficulty urinating, dysuria and frequency.   Musculoskeletal: Negative for arthralgias.  Skin: Negative for itching and rash.  Neurological: Positive for headaches. Negative for light-headedness and numbness.  Hematological: Negative for adenopathy. Does not bruise/bleed easily.  Psychiatric/Behavioral: Negative for confusion.    MEDICAL HISTORY:  Past Medical History:  Diagnosis Date  . Hypertension     SURGICAL HISTORY: Past Surgical History:  Procedure Laterality Date  . wisdon tooth removal      SOCIAL HISTORY: Social History   Socioeconomic History  . Marital status: Married    Spouse name: Not on file  . Number of children: Not on file  . Years of education: Not on file  . Highest education level: Not on file  Occupational History  . Not on file  Social Needs  . Financial resource strain: Not on file  . Food insecurity:    Worry: Not on file    Inability: Not on file  . Transportation needs:    Medical: Not on file  Non-medical: Not on file  Tobacco Use  . Smoking status: Current Every Day Smoker    Packs/day: 1.00    Types: Cigarettes  . Smokeless tobacco: Never Used  Substance and Sexual Activity  . Alcohol use: Yes  . Drug use: Never  . Sexual activity: Not on file    Lifestyle  . Physical activity:    Days per week: Not on file    Minutes per session: Not on file  . Stress: Not on file  Relationships  . Social connections:    Talks on phone: Not on file    Gets together: Not on file    Attends religious service: Not on file    Active member of club or organization: Not on file    Attends meetings of clubs or organizations: Not on file    Relationship status: Not on file  . Intimate partner violence:    Fear of current or ex partner: Not on file    Emotionally abused: Not on file    Physically abused: Not on file    Forced sexual activity: Not on file  Other Topics Concern  . Not on file  Social History Narrative  . Not on file    FAMILY HISTORY: Family History  Problem Relation Age of Onset  . Crohn's disease Mother   . Hypertension Mother   . Breast cancer Maternal Aunt   . Bladder Cancer Maternal Grandfather   . Cervical cancer Other   . Non-Hodgkin's lymphoma Other     ALLERGIES:  has No Known Allergies.  MEDICATIONS:  Current Outpatient Medications  Medication Sig Dispense Refill  . CHANTIX CONTINUING MONTH PAK 1 MG tablet See admin instructions.  2  . cyclobenzaprine (FLEXERIL) 10 MG tablet Take 1 tablet (10 mg total) by mouth 3 (three) times daily as needed for muscle spasms. 30 tablet 0  . ferrous sulfate 325 (65 FE) MG EC tablet Take 1 tablet (325 mg total) by mouth daily with breakfast. (Patient not taking: Reported on 09/19/2018) 60 tablet 0   No current facility-administered medications for this visit.      PHYSICAL EXAMINATION: ECOG PERFORMANCE STATUS: 1 - Symptomatic but completely ambulatory Vitals:   10/04/18 1003  BP: 140/84  Pulse: 80  Resp: 18  Temp: (!) 96.4 F (35.8 C)   Filed Weights   10/04/18 1003  Weight: 171 lb 14.4 oz (78 kg)    Physical Exam Constitutional:      General: He is not in acute distress. HENT:     Head: Normocephalic and atraumatic.  Eyes:     General: No scleral  icterus.    Pupils: Pupils are equal, round, and reactive to light.  Neck:     Musculoskeletal: Normal range of motion and neck supple.  Cardiovascular:     Rate and Rhythm: Normal rate and regular rhythm.     Heart sounds: Normal heart sounds.  Pulmonary:     Effort: Pulmonary effort is normal. No respiratory distress.     Breath sounds: No wheezing.  Abdominal:     General: Bowel sounds are normal. There is no distension.     Palpations: Abdomen is soft. There is no mass.     Tenderness: There is no abdominal tenderness.  Musculoskeletal: Normal range of motion.        General: No deformity.  Skin:    General: Skin is warm and dry.     Coloration: Skin is pale.     Findings: No erythema  or rash.  Neurological:     Mental Status: He is alert and oriented to person, place, and time.     Cranial Nerves: No cranial nerve deficit.     Coordination: Coordination normal.  Psychiatric:        Behavior: Behavior normal.        Thought Content: Thought content normal.      LABORATORY DATA:  I have reviewed the data as listed Lab Results  Component Value Date   WBC 7.3 10/04/2018   HGB 12.4 (L) 10/04/2018   HCT 42.4 10/04/2018   MCV 73.6 (L) 10/04/2018   PLT 234 10/04/2018   Recent Labs    08/29/18 2239  NA 140  K 3.1*  CL 106  CO2 23  GLUCOSE 117*  BUN 16  CREATININE 1.01  CALCIUM 9.6  GFRNONAA >60  GFRAA >60   Iron/TIBC/Ferritin/ %Sat    Component Value Date/Time   IRON 19 (L) 09/19/2018 1240   TIBC 591 (H) 09/19/2018 1240   FERRITIN 4 (L) 09/19/2018 1240   IRONPCTSAT 3 (L) 09/19/2018 1240        ASSESSMENT & PLAN:  1. Iron deficiency anemia due to chronic blood loss   2. Gastrointestinal hemorrhage, unspecified gastrointestinal hemorrhage type   3. Chronic fatigue   4. Nonintractable headache, unspecified chronicity pattern, unspecified headache type   labs are reviewed and discussed.  Discussed with patient and his mother that patient has low level  of iron, he also can not tolerate oral iron supplementation.  I recommend IV iron infusion.  Plan IV iron with Feraheme 510mg  weekly x 2. Allergy reactions/infusion reaction including anaphylactic reaction discussed with patient. Other side effects include but not limited to high blood pressure, skin rash, weight gain, leg swelling, etc. Patient voices understanding and willing to proceed.  # GI bleeding- blood in the stool/ stomach discomfort, refer to Dr.Anna for further evaluation.  # Profound weakness and fatigue, repeat cbc today.  # Chronic headache, unknown etiology.   Advise patient to stop using NSAIDs.   Orders Placed This Encounter  Procedures  . CBC with Differential/Platelet    Standing Status:   Future    Number of Occurrences:   1    Standing Expiration Date:   10/05/2019  . Ambulatory referral to Gastroenterology    Referral Priority:   Routine    Referral Type:   Consultation    Referral Reason:   Specialty Services Required    Referred to Provider:   Wyline MoodAnna, Kiran, MD    Number of Visits Requested:   1  . Sample to Blood Bank    Standing Status:   Future    Number of Occurrences:   1    Standing Expiration Date:   10/05/2019    All questions were answered. The patient knows to call the clinic with any problems questions or concerns.  Return of visit: 8 weeks.   Rickard PatienceZhou Allexa Acoff, MD, PhD Hematology Oncology [facility] Pager- [phone] 10/04/2018

## 2018-10-04 NOTE — Progress Notes (Signed)
Pt here for follow up. He states he has Nausea and diarrhea every morning. Has been having headaches for over a month.

## 2018-10-06 ENCOUNTER — Inpatient Hospital Stay: Payer: 59

## 2018-10-06 VITALS — BP 162/95 | HR 79 | Temp 97.2°F | Resp 18

## 2018-10-06 DIAGNOSIS — D509 Iron deficiency anemia, unspecified: Secondary | ICD-10-CM | POA: Insufficient documentation

## 2018-10-06 DIAGNOSIS — D500 Iron deficiency anemia secondary to blood loss (chronic): Secondary | ICD-10-CM | POA: Insufficient documentation

## 2018-10-06 DIAGNOSIS — Z8639 Personal history of other endocrine, nutritional and metabolic disease: Secondary | ICD-10-CM | POA: Insufficient documentation

## 2018-10-06 MED ORDER — SODIUM CHLORIDE 0.9 % IV SOLN
Freq: Once | INTRAVENOUS | Status: AC
  Administered 2018-10-06: 12:00:00 via INTRAVENOUS
  Filled 2018-10-06: qty 250

## 2018-10-06 MED ORDER — SODIUM CHLORIDE 0.9 % IV SOLN
510.0000 mg | Freq: Once | INTRAVENOUS | Status: AC
  Administered 2018-10-06: 510 mg via INTRAVENOUS
  Filled 2018-10-06: qty 17

## 2018-10-06 NOTE — Addendum Note (Signed)
Addended by: Rickard PatienceYU, Tityana Pagan on: 10/06/2018 11:38 AM   Modules accepted: Orders

## 2018-10-13 ENCOUNTER — Inpatient Hospital Stay: Payer: 59

## 2018-10-13 VITALS — BP 131/82 | HR 66 | Temp 97.2°F | Resp 18

## 2018-10-13 DIAGNOSIS — D500 Iron deficiency anemia secondary to blood loss (chronic): Secondary | ICD-10-CM | POA: Diagnosis not present

## 2018-10-13 MED ORDER — SODIUM CHLORIDE 0.9 % IV SOLN
510.0000 mg | Freq: Once | INTRAVENOUS | Status: AC
  Administered 2018-10-13: 510 mg via INTRAVENOUS
  Filled 2018-10-13: qty 17

## 2018-10-13 MED ORDER — SODIUM CHLORIDE 0.9 % IV SOLN
Freq: Once | INTRAVENOUS | Status: AC
  Administered 2018-10-13: 14:00:00 via INTRAVENOUS
  Filled 2018-10-13: qty 250

## 2018-10-30 ENCOUNTER — Ambulatory Visit: Payer: 59 | Admitting: Gastroenterology

## 2018-10-30 ENCOUNTER — Encounter: Payer: Self-pay | Admitting: Gastroenterology

## 2018-10-30 ENCOUNTER — Other Ambulatory Visit: Payer: Self-pay

## 2018-10-30 VITALS — BP 151/85 | HR 71 | Ht 67.0 in | Wt 177.6 lb

## 2018-10-30 DIAGNOSIS — G8929 Other chronic pain: Secondary | ICD-10-CM

## 2018-10-30 DIAGNOSIS — R1013 Epigastric pain: Secondary | ICD-10-CM | POA: Diagnosis not present

## 2018-10-30 DIAGNOSIS — R197 Diarrhea, unspecified: Secondary | ICD-10-CM

## 2018-10-30 DIAGNOSIS — D500 Iron deficiency anemia secondary to blood loss (chronic): Secondary | ICD-10-CM

## 2018-10-30 NOTE — Addendum Note (Signed)
Addended by: Daisy Blossom on: 10/30/2018 01:42 PM   Modules accepted: Orders

## 2018-10-30 NOTE — Progress Notes (Signed)
Justin MoodKiran Kurt Hoffmeier Powers, MRCP(U.K) [address]  [address]  [place], [place] [address]  Main: [phone]  Fax: [phone]   Gastroenterology Consultation  Referring Provider:     Rickard Powers, Justin Powers Primary Care Physician:  Justin Powers, Justin Powers Primary Gastroenterologist:  Justin Powers  Reason for Consultation:     Anemia         HPI:   Justin Powers is a 28 y.o. y/o male referred for consultation & management  by Dr. Franciso Powers, Justin Powers.     Referred by Dr Cathie HoopsYu in Hematology for anemia. Recent ER visit and was found have microcytic anemia. Ferritin of 4 . 2 years back Hb 13 grams with MCV 72.    Rectal bleeding: few times when he wiped, sometimes a few spots and at one time a clot Nose bleeds: no  Hematemesis or hemoptysis : no  Blood in urine : no   Eats meat. Used to take a lot of [employer], stopped last year. Took it daily for a few years. Some epigastric pain, usually on and off , acid reflux in the past .   Diarrhea for a year, has 3-4 bowel movements a day , semi solid- some blood.    Past Medical History:  Diagnosis Date  . Hypertension     Past Surgical History:  Procedure Laterality Date  . wisdon tooth removal      Prior to Admission medications   Medication Sig Start Date End Date Taking? Authorizing Provider  CHANTIX CONTINUING MONTH PAK 1 MG tablet See admin instructions. 09/12/18   Provider, Historical, Powers  cyclobenzaprine (FLEXERIL) 10 MG tablet Take 1 tablet (10 mg total) by mouth 3 (three) times daily as needed for muscle spasms. Patient not taking: Reported on 10/30/2018 05/21/18   Kem Boroughsriplett, Cari B, FNP  ferrous sulfate 325 (65 FE) MG EC tablet Take 1 tablet (325 mg total) by mouth daily with breakfast. Patient not taking: Reported on 09/19/2018 08/30/18 08/30/19  Darci CurrentBrown, Hughson N, Powers    Family History  Problem Relation Age of Onset  . Crohn's disease Mother   . Hypertension Mother   . Breast cancer Maternal Aunt   . Bladder Cancer Maternal  Grandfather   . Cervical cancer Other   . Non-Hodgkin's lymphoma Other      Social History   Tobacco Use  . Smoking status: Current Every Day Smoker    Packs/day: 1.00    Types: Cigarettes  . Smokeless tobacco: Never Used  Substance Use Topics  . Alcohol use: Yes  . Drug use: Never    Allergies as of 10/30/2018  . (No Known Allergies)    Review of Systems:    All systems reviewed and negative except where noted in HPI.   Physical Exam:  BP (!) 151/85   Pulse 71   Ht 5\' 7"  (1.702 m)   Wt 177 lb 9.6 oz (80.6 kg)   BMI 27.82 kg/m  No LMP for male patient. Psych:  Alert and cooperative. Normal mood and affect. General:   Alert,  Well-developed, well-nourished, pleasant and cooperative in NAD Head:  Normocephalic and atraumatic. Eyes:  Sclera clear, no icterus.   Conjunctiva pink. Ears:  Normal auditory acuity. Nose:  No deformity, discharge, or lesions. Mouth:  No deformity or lesions,oropharynx pink & moist. Neck:  Supple; no masses or thyromegaly. Lungs:  Respirations even and unlabored.  Clear throughout to auscultation.   No wheezes, crackles, or rhonchi. No acute distress. Heart:  Regular rate and rhythm; no murmurs, clicks, rubs, or gallops. Abdomen:  Normal bowel sounds.  No bruits.  Soft, non-tender and non-distended without masses, hepatosplenomegaly or hernias noted.  No guarding or rebound tenderness.    Neurologic:  Alert and oriented x3;  grossly normal neurologically. Skin:  Intact without significant lesions or rashes. No jaundice. Lymph Nodes:  No significant cervical adenopathy. Psych:  Alert and cooperative. Normal mood and affect.  Imaging Studies: No results found.  Assessment and Plan:   Justin Powers is a 28 y.o. y/o male has been referred for iron deficiency anemia- no ovcert blood loss, some NSAID use last year. Some epigastric pain ,diarrhea - need to R/o IBD- mother has crohns   Plan  1. B12,folate, celiac serology, urine analysis 2.  EGD+colonosocpy +/- capsule study of the small bowel  3. Stool studies for diarrhea- will r/o IBD with endoscopy  4. Stop NSAID use 5. Stop Smoking    I have discussed alternative options, risks & benefits,  which include, but are not limited to, bleeding, infection, perforation,respiratory complication & drug reaction.  The patient agrees with this plan & written consent will be obtained.      Follow up in 6 weeks  Dr Justin Mood Powers,MRCP(U.K)

## 2018-10-31 LAB — H. PYLORI BREATH TEST: H pylori Breath Test: NEGATIVE

## 2018-11-01 LAB — CELIAC PANEL 10
Antigliadin Abs, IgA: 5 units (ref 0–19)
Endomysial IgA: NEGATIVE
Gliadin IgG: 9 units (ref 0–19)
IgA/Immunoglobulin A, Serum: 163 mg/dL (ref 90–386)
Tissue Transglut Ab: 13 U/mL — ABNORMAL HIGH (ref 0–5)
Transglutaminase IgA: <2 U/mL (ref 0–3)

## 2018-11-01 LAB — B12 AND FOLATE PANEL
Folate: 12.4 ng/mL (ref >3.0)
Vitamin B-12: 502 pg/mL (ref 232–1245)

## 2018-11-01 LAB — URINALYSIS
Bilirubin, UA: NEGATIVE
Glucose, UA: NEGATIVE
Ketones, UA: NEGATIVE
Nitrite, UA: NEGATIVE
Protein, UA: NEGATIVE
RBC, UA: NEGATIVE
Specific Gravity, UA: 1.02 (ref 1.005–1.030)
Urobilinogen, Ur: 0.2 mg/dL (ref 0.2–1.0)
pH, UA: 5 (ref 5.0–7.5)

## 2018-11-02 ENCOUNTER — Telehealth: Payer: Self-pay

## 2018-11-02 LAB — SPECIMEN STATUS REPORT

## 2018-11-02 LAB — CLOSTRIDIUM DIFFICILE EIA: C difficile Toxins A+B, EIA: NEGATIVE

## 2018-11-02 NOTE — Telephone Encounter (Signed)
Called pt to inform him of lab results. Unable to contact. LVM to return call 

## 2018-11-02 NOTE — Telephone Encounter (Signed)
-----   Message from [name], MD sent at 11/01/2018 11:39 AM EST ----- Denton Meek inform patient celiac blood test was positive- may have celiac disease- will confirnm after EGD. C/c Dr Cathie Hoops

## 2018-11-02 NOTE — Telephone Encounter (Signed)
Spoke with pt and informed him of lab results. 

## 2018-11-03 LAB — GI PROFILE, STOOL, PCR
Adenovirus F 40/41: DETECTED — AB
Astrovirus: NOT DETECTED
C difficile toxin A/B: NOT DETECTED
Campylobacter: NOT DETECTED
Cryptosporidium: NOT DETECTED
Cyclospora cayetanensis: NOT DETECTED
Entamoeba histolytica: NOT DETECTED
Enteroaggregative E coli: NOT DETECTED
Enteropathogenic E coli: NOT DETECTED
Enterotoxigenic E coli: NOT DETECTED
Giardia lamblia: NOT DETECTED
Norovirus GI/GII: NOT DETECTED
Plesiomonas shigelloides: NOT DETECTED
Rotavirus A: NOT DETECTED
Salmonella: NOT DETECTED
Sapovirus: NOT DETECTED
Shiga-toxin-producing E coli: NOT DETECTED
Shigella/Enteroinvasive E coli: NOT DETECTED
Vibrio cholerae: NOT DETECTED
Vibrio: NOT DETECTED
Yersinia enterocolitica: NOT DETECTED

## 2018-11-03 LAB — SPECIMEN STATUS REPORT

## 2018-11-03 LAB — CALPROTECTIN, FECAL: Calprotectin, Fecal: 79 ug/g (ref 0–120)

## 2018-11-07 ENCOUNTER — Encounter: Payer: Self-pay | Admitting: Gastroenterology

## 2018-11-08 ENCOUNTER — Encounter: Payer: Self-pay | Admitting: *Deleted

## 2018-11-08 ENCOUNTER — Ambulatory Visit
Admission: RE | Admit: 2018-11-08 | Discharge: 2018-11-08 | Disposition: A | Discharge status: Home / Self Care | Payer: 59 | Attending: Gastroenterology | Admitting: Gastroenterology

## 2018-11-08 ENCOUNTER — Ambulatory Visit: Payer: 59 | Admitting: Anesthesiology

## 2018-11-08 ENCOUNTER — Encounter
Admission: RE | Disposition: A | Discharge status: Home / Self Care | Payer: Self-pay | Source: Home / Self Care | Attending: Gastroenterology

## 2018-11-08 DIAGNOSIS — K2210 Ulcer of esophagus without bleeding: Secondary | ICD-10-CM

## 2018-11-08 DIAGNOSIS — Z79899 Other long term (current) drug therapy: Secondary | ICD-10-CM | POA: Insufficient documentation

## 2018-11-08 DIAGNOSIS — Z8049 Family history of malignant neoplasm of other genital organs: Secondary | ICD-10-CM | POA: Diagnosis not present

## 2018-11-08 DIAGNOSIS — Z8379 Family history of other diseases of the digestive system: Secondary | ICD-10-CM | POA: Insufficient documentation

## 2018-11-08 DIAGNOSIS — Z803 Family history of malignant neoplasm of breast: Secondary | ICD-10-CM | POA: Diagnosis not present

## 2018-11-08 DIAGNOSIS — K210 Gastro-esophageal reflux disease with esophagitis: Secondary | ICD-10-CM | POA: Insufficient documentation

## 2018-11-08 DIAGNOSIS — K449 Diaphragmatic hernia without obstruction or gangrene: Secondary | ICD-10-CM | POA: Insufficient documentation

## 2018-11-08 DIAGNOSIS — I10 Essential (primary) hypertension: Secondary | ICD-10-CM | POA: Diagnosis not present

## 2018-11-08 DIAGNOSIS — K2980 Duodenitis without bleeding: Secondary | ICD-10-CM | POA: Diagnosis not present

## 2018-11-08 DIAGNOSIS — Z807 Family history of other malignant neoplasms of lymphoid, hematopoietic and related tissues: Secondary | ICD-10-CM | POA: Insufficient documentation

## 2018-11-08 DIAGNOSIS — R197 Diarrhea, unspecified: Secondary | ICD-10-CM

## 2018-11-08 DIAGNOSIS — K621 Rectal polyp: Secondary | ICD-10-CM | POA: Diagnosis not present

## 2018-11-08 DIAGNOSIS — Z8249 Family history of ischemic heart disease and other diseases of the circulatory system: Secondary | ICD-10-CM | POA: Diagnosis not present

## 2018-11-08 DIAGNOSIS — D500 Iron deficiency anemia secondary to blood loss (chronic): Secondary | ICD-10-CM | POA: Insufficient documentation

## 2018-11-08 DIAGNOSIS — K640 First degree hemorrhoids: Secondary | ICD-10-CM | POA: Insufficient documentation

## 2018-11-08 DIAGNOSIS — F17210 Nicotine dependence, cigarettes, uncomplicated: Secondary | ICD-10-CM | POA: Diagnosis not present

## 2018-11-08 DIAGNOSIS — Z8052 Family history of malignant neoplasm of bladder: Secondary | ICD-10-CM | POA: Insufficient documentation

## 2018-11-08 DIAGNOSIS — G8929 Other chronic pain: Secondary | ICD-10-CM

## 2018-11-08 DIAGNOSIS — R1013 Epigastric pain: Secondary | ICD-10-CM

## 2018-11-08 HISTORY — PX: COLONOSCOPY WITH PROPOFOL: SHX5780

## 2018-11-08 HISTORY — PX: ESOPHAGOGASTRODUODENOSCOPY (EGD) WITH PROPOFOL: SHX5813

## 2018-11-08 SURGERY — COLONOSCOPY WITH PROPOFOL
Anesthesia: General

## 2018-11-08 MED ORDER — SODIUM CHLORIDE 0.9 % IV SOLN
INTRAVENOUS | Status: DC
  Administered 2018-11-08: 13:00:00 via INTRAVENOUS

## 2018-11-08 MED ORDER — SUCRALFATE 1 G PO TABS: 1.0000 g | ORAL_TABLET | Freq: Four times a day (QID) | ORAL | 1 refills | Status: DC

## 2018-11-08 MED ORDER — OMEPRAZOLE 40 MG PO CPDR: 40.0000 mg | DELAYED_RELEASE_CAPSULE | Freq: Two times a day (BID) | ORAL | 1 refills | Status: DC

## 2018-11-08 MED ORDER — PROPOFOL 500 MG/50ML IV EMUL
INTRAVENOUS | Status: DC | PRN
  Administered 2018-11-08: 120 ug/kg/min via INTRAVENOUS

## 2018-11-08 MED ORDER — GLYCOPYRROLATE PF 0.2 MG/ML IJ SOSY
PREFILLED_SYRINGE | INTRAMUSCULAR | Status: DC | PRN
  Administered 2018-11-08: .2 mg via INTRAVENOUS

## 2018-11-08 MED ORDER — LIDOCAINE HCL (CARDIAC) PF 100 MG/5ML IV SOSY
PREFILLED_SYRINGE | INTRAVENOUS | Status: DC | PRN
  Administered 2018-11-08: 100 mg via INTRAVENOUS

## 2018-11-08 MED ORDER — PROPOFOL 10 MG/ML IV BOLUS
INTRAVENOUS | Status: DC | PRN
  Administered 2018-11-08 (×2): 100 mg via INTRAVENOUS

## 2018-11-08 NOTE — Op Note (Signed)
[facility] Gastroenterology Patient Name: Justin Powers Procedure Date: 11/08/2018 12:45 PM MRN: [id number] Account #: [id number] Date of Birth: July 31, 1991 Admit Type: Outpatient Age: 28 Room: [facility] ENDO ROOM 2 Gender: Male Note Status: Finalized Procedure:            Colonoscopy Indications:          Iron deficiency anemia Providers:            [name] MD, MD Medicines:            Monitored Anesthesia Care Complications:        No immediate complications. Procedure:            Pre-Anesthesia Assessment:                       - Prior to the procedure, a History and Physical was                        performed, and patient medications, allergies and                        sensitivities were reviewed. The patient's tolerance of                        previous anesthesia was reviewed.                       - The risks and benefits of the procedure and the                        sedation options and risks were discussed with the                        patient. All questions were answered and informed                        consent was obtained.                       - ASA Grade Assessment: II - A patient with mild                        systemic disease.                       After obtaining informed consent, the colonoscope was                        passed under direct vision. Throughout the procedure,                        the patient's blood pressure, pulse, and oxygen                        saturations were monitored continuously. The                        Colonoscope was introduced through the anus and                        advanced to the the terminal ileum. The colonoscopy was  performed with ease. The patient tolerated the                        procedure well. The quality of the bowel preparation                        was good. Findings:      The perianal and digital rectal examinations were normal.      Non-bleeding internal  hemorrhoids were found during retroflexion. The       hemorrhoids were medium-sized and Grade I (internal hemorrhoids that do       not prolapse).      The terminal ileum appeared normal. Biopsies were taken with a cold       forceps for histology.      The colon (entire examined portion) appeared normal. Biopsies for       histology were taken with a cold forceps from the entire colon for       evaluation of microscopic colitis.      The exam was otherwise without abnormality. Impression:           - Non-bleeding internal hemorrhoids.                       - The examined portion of the ileum was normal.                        Biopsied.                       - The entire examined colon is normal. Biopsied.                       - The examination was otherwise normal. Recommendation:       - Discharge patient to home (with escort).                       - Resume previous diet.                       - Continue present medications.                       - Await pathology results.                       - Return to my office in 1 week. Procedure Code(s):    --- Professional ---                       [phone] [id number], Colonoscopy, flexible; with biopsy, single or                        multiple Diagnosis Code(s):    --- Professional ---                       K64.0, First degree hemorrhoids                       D50.9, Iron deficiency anemia, unspecified CPT copyright 2018 American Medical Association. All rights reserved. The codes documented in this report are preliminary and upon coder review may  be revised to meet current compliance requirements. Wyline MoodKiran Myli Pae, MD Wyline MoodKiran Ekansh Sherk  MD, MD 11/08/2018 1:28:14 PM This report has been signed electronically. Number of Addenda: 0 Note Initiated On: 11/08/2018 12:45 PM Scope Withdrawal Time: 0 hours 9 minutes 42 seconds  Total Procedure Duration: 0 hours 11 minutes 46 seconds       [facility]

## 2018-11-08 NOTE — Op Note (Signed)
[facility] Gastroenterology Patient Name: Justin Powers Procedure Date: 11/08/2018 12:45 PM MRN: [id number] Account #: [id number] Date of Birth: 16-Mar-1991 Admit Type: Outpatient Age: 28 Room: [facility] ENDO ROOM 2 Gender: Male Note Status: Finalized Procedure:            Upper GI endoscopy Indications:          Iron deficiency anemia Providers:            [name] MD, MD Medicines:            Monitored Anesthesia Care Complications:        No immediate complications. Procedure:            Pre-Anesthesia Assessment:                       - Prior to the procedure, a History and Physical was                        performed, and patient medications, allergies and                        sensitivities were reviewed. The patient's tolerance of                        previous anesthesia was reviewed.                       - The risks and benefits of the procedure and the                        sedation options and risks were discussed with the                        patient. All questions were answered and informed                        consent was obtained.                       - ASA Grade Assessment: II - A patient with mild                        systemic disease.                       After obtaining informed consent, the endoscope was                        passed under direct vision. Throughout the procedure,                        the patient's blood pressure, pulse, and oxygen                        saturations were monitored continuously. The Endoscope                        was introduced through the mouth, and advanced to the                        third part of duodenum. The upper GI endoscopy was  accomplished without difficulty. The patient tolerated                        the procedure fairly well. Findings:      The examined duodenum was normal. Biopsies for histology were taken with       a cold forceps for evaluation of celiac  disease.      A large hiatal hernia was present.      LA Grade D (one or more mucosal breaks involving at least 75% of       esophageal circumference) esophagitis with bleeding was found in the       lower third of the esophagus. Impression:           - Normal examined duodenum. Biopsied.                       - Large hiatal hernia.                       - LA Grade D reflux esophagitis. Recommendation:       - Await pathology results.                       - Omeprazole 40 mg BID. Repeat EGD in 6 weeks                       - Perform a colonoscopy today.                       - Repeat upper endoscopy in 6 weeks to evaluate the                        response to therapy. Procedure Code(s):    --- Professional ---                       [phone], Esophagogastroduodenoscopy, flexible, transoral;                        with biopsy, single or multiple Diagnosis Code(s):    --- Professional ---                       K44.9, Diaphragmatic hernia without obstruction or                        gangrene                       K21.0, Gastro-esophageal reflux disease with esophagitis                       D50.9, Iron deficiency anemia, unspecified CPT copyright 2018 American Medical Association. All rights reserved. The codes documented in this report are preliminary and upon coder review may  be revised to meet current compliance requirements. [name], MD [name] MD, MD 11/08/2018 1:11:36 PM This report has been signed electronically. Number of Addenda: 0 Note Initiated On: 11/08/2018 12:45 PM      [facility] [address]

## 2018-11-08 NOTE — H&P (Signed)
[name], MD [address], [address], [place], [place], [address] [address], [place], [place], [address] Phone: [phone]  Fax: [phone]  Primary Care Physician:  Franciso Bend, NP   Pre-Procedure History & Physical: HPI:  Justin Powers is a 28 y.o. male is here for an endoscopy and colonoscopy    Past Medical History:  Diagnosis Date  . Hypertension     Past Surgical History:  Procedure Laterality Date  . wisdon tooth removal      Prior to Admission medications   Medication Sig Start Date End Date Taking? Authorizing Provider  DULoxetine (CYMBALTA) 30 MG capsule Take 30 mg by mouth daily.   Yes [provider]  ferrous sulfate 325 (65 FE) MG EC tablet Take 1 tablet (325 mg total) by mouth daily with breakfast. 08/30/18 08/30/19 Yes Darci Current, MD  CHANTIX CONTINUING MONTH PAK 1 MG tablet See admin instructions. 09/12/18   [provider]  cyclobenzaprine (FLEXERIL) 10 MG tablet Take 1 tablet (10 mg total) by mouth 3 (three) times daily as needed for muscle spasms. Patient not taking: Reported on 10/30/2018 05/21/18   Chinita Pester, FNP    Allergies as of 10/30/2018  . (No Known Allergies)    Family History  Problem Relation Age of Onset  . Crohn's disease Mother   . Hypertension Mother   . Breast cancer Maternal Aunt   . Bladder Cancer Maternal Grandfather   . Cervical cancer Other   . Non-Hodgkin's lymphoma Other     Social History   Socioeconomic History  . Marital status: Married    Spouse name: Not on file  . Number of children: Not on file  . Years of education: Not on file  . Highest education level: Not on file  Occupational History  . Not on file  Social Needs  . Financial resource strain: Not on file  . Food insecurity:    Worry: Not on file    Inability: Not on file  . Transportation needs:    Medical: Not on file    Non-medical: Not on file  Tobacco Use  . Smoking status: Current  Every Day Smoker    Packs/day: 1.00    Types: Cigarettes  . Smokeless tobacco: Never Used  Substance and Sexual Activity  . Alcohol use: Yes  . Drug use: Never  . Sexual activity: Not on file  Lifestyle  . Physical activity:    Days per week: Not on file    Minutes per session: Not on file  . Stress: Not on file  Relationships  . Social connections:    Talks on phone: Not on file    Gets together: Not on file    Attends religious service: Not on file    Active member of club or organization: Not on file    Attends meetings of clubs or organizations: Not on file    Relationship status: Not on file  . Intimate partner violence:    Fear of current or ex partner: Not on file    Emotionally abused: Not on file    Physically abused: Not on file    Forced sexual activity: Not on file  Other Topics Concern  . Not on file  Social History Narrative  . Not on file    Review of Systems: See HPI, otherwise negative ROS  Physical Exam: BP (!) 140/98   Pulse 71   Temp (!) 96.3 F (35.7 C) (Tympanic)  Resp 18   Ht 5\' 7"  (1.702 m)   Wt 81.6 kg   SpO2 100%   BMI 28.19 kg/m  General:   Alert,  pleasant and cooperative in NAD Head:  Normocephalic and atraumatic. Neck:  Supple; no masses or thyromegaly. Lungs:  Clear throughout to auscultation, normal respiratory effort.    Heart:  +S1, +S2, Regular rate and rhythm, No edema. Abdomen:  Soft, nontender and nondistended. Normal bowel sounds, without guarding, and without rebound.   Neurologic:  Alert and  oriented x4;  grossly normal neurologically.  Impression/Plan: Justin Powers is here for an endoscopy and colonoscopy  to be performed for  evaluation of diarrhea    Risks, benefits, limitations, and alternatives regarding endoscopy have been reviewed with the patient.  Questions have been answered.  All parties agreeable.   [name], MD  11/08/2018, 12:42 PM

## 2018-11-08 NOTE — Anesthesia Post-op Follow-up Note (Signed)
Anesthesia QCDR form completed.        

## 2018-11-08 NOTE — Anesthesia Preprocedure Evaluation (Addendum)
Anesthesia Evaluation  Patient identified by MRN, date of birth, ID band Patient awake    Reviewed: Allergy & Precautions, H&P , NPO status , reviewed documented beta blocker date and time   Airway Mallampati: I  TM Distance: >3 FB Neck ROM: full    Dental  (+) Teeth Intact   Pulmonary Current Smoker,    Pulmonary exam normal        Cardiovascular hypertension, Normal cardiovascular exam     Neuro/Psych    GI/Hepatic neg GERD  ,  Endo/Other    Renal/GU      Musculoskeletal   Abdominal   Peds  Hematology  (+) Blood dyscrasia, anemia ,   Anesthesia Other Findings Past Medical History: No date: Hypertension Microcytic anemia  Past Surgical History: No date: wisdon tooth removal     Reproductive/Obstetrics                            Anesthesia Physical Anesthesia Plan  ASA: II  Anesthesia Plan: General   Post-op Pain Management:    Induction: Intravenous  PONV Risk Score and Plan: 2 and Treatment may vary due to age or medical condition and TIVA  Airway Management Planned: Nasal Cannula and Natural Airway  Additional Equipment:   Intra-op Plan:   Post-operative Plan:   Informed Consent: I have reviewed the patients History and Physical, chart, labs and discussed the procedure including the risks, benefits and alternatives for the proposed anesthesia with the patient or authorized representative who has indicated his/her understanding and acceptance.     Dental Advisory Given  Plan Discussed with: CRNA  Anesthesia Plan Comments:         Anesthesia Quick Evaluation

## 2018-11-08 NOTE — Transfer of Care (Signed)
Immediate Anesthesia Transfer of Care Note  Patient: Justin Powers  Procedure(s) Performed: COLONOSCOPY WITH PROPOFOL (N/A ) ESOPHAGOGASTRODUODENOSCOPY (EGD) WITH PROPOFOL (N/A )  Patient Location: PACU  Anesthesia Type:General  Level of Consciousness: awake, alert  and oriented  Airway & Oxygen Therapy: Patient Spontanous Breathing and Patient connected to nasal cannula oxygen  Post-op Assessment: Report given to RN and Post -op Vital signs reviewed and stable  Post vital signs: Reviewed and stable  Last Vitals:  Vitals Value Taken Time  BP 122/77 11/08/2018  1:31 PM  Temp 36.2 C 11/08/2018  1:33 PM  Pulse 80 11/08/2018  1:33 PM  Resp 13 11/08/2018  1:33 PM  SpO2 100 % 11/08/2018  1:33 PM  Vitals shown include unvalidated device data.  Last Pain:  Vitals:   11/08/18 1331  TempSrc:   PainSc: 0-No pain         Complications: No apparent anesthesia complications

## 2018-11-09 ENCOUNTER — Encounter: Payer: Self-pay | Admitting: Gastroenterology

## 2018-11-10 ENCOUNTER — Telehealth: Payer: Self-pay

## 2018-11-10 LAB — SURGICAL PATHOLOGY

## 2018-11-10 NOTE — Telephone Encounter (Signed)
Spoke with pt and informed him of stool test results. Pt states he's still experiencing diarrhea.

## 2018-11-10 NOTE — Telephone Encounter (Signed)
-----   Message from Kiran Anna, MD sent at 11/03/2018 12:14 PM EST ----- Hollye Pritt inform has adeno virus in his stool- usually should resolve on its own- enquire how his diarrhea is doing 

## 2018-11-10 NOTE — Telephone Encounter (Signed)
-----   Message from [name], MD sent at 11/03/2018 12:14 PM EST ----- Justin Powers inform has adeno virus in his stool- usually should resolve on its own- enquire how his diarrhea is doing

## 2018-11-10 NOTE — Anesthesia Postprocedure Evaluation (Signed)
Anesthesia Post Note  Patient: Weber Amado  Procedure(s) Performed: COLONOSCOPY WITH PROPOFOL (N/A ) ESOPHAGOGASTRODUODENOSCOPY (EGD) WITH PROPOFOL (N/A )  Patient location during evaluation: Endoscopy Anesthesia Type: General Level of consciousness: awake and alert Pain management: pain level controlled Vital Signs Assessment: post-procedure vital signs reviewed and stable Respiratory status: spontaneous breathing, nonlabored ventilation and respiratory function stable Cardiovascular status: blood pressure returned to baseline and stable Postop Assessment: no apparent nausea or vomiting Anesthetic complications: no     Last Vitals:  Vitals:   11/08/18 1350 11/08/18 1357  BP: (!) 129/91   Pulse: 66 64  Resp: 16 19  Temp:    SpO2: 100% 100%    Last Pain:  Vitals:   11/08/18 1357  TempSrc:   PainSc: 0-No pain                 Christia Reading

## 2018-11-10 NOTE — Telephone Encounter (Signed)
Pt is returning your call

## 2018-11-10 NOTE — Telephone Encounter (Signed)
Called pt to inform him of lab results. Unable to contact. LVM to return call 

## 2018-11-14 ENCOUNTER — Encounter: Payer: Self-pay | Admitting: Gastroenterology

## 2018-11-20 ENCOUNTER — Ambulatory Visit: Payer: 59 | Admitting: Gastroenterology

## 2018-11-28 ENCOUNTER — Encounter: Payer: Self-pay | Admitting: Gastroenterology

## 2018-11-28 ENCOUNTER — Ambulatory Visit: Payer: 59 | Admitting: Gastroenterology

## 2018-11-28 VITALS — BP 115/77 | HR 83 | Ht 67.0 in | Wt 180.6 lb

## 2018-11-28 DIAGNOSIS — K209 Esophagitis, unspecified without bleeding: Secondary | ICD-10-CM

## 2018-11-28 DIAGNOSIS — R197 Diarrhea, unspecified: Secondary | ICD-10-CM | POA: Diagnosis not present

## 2018-11-28 DIAGNOSIS — D500 Iron deficiency anemia secondary to blood loss (chronic): Secondary | ICD-10-CM | POA: Diagnosis not present

## 2018-11-28 MED ORDER — RIFAXIMIN 550 MG PO TABS: 550.0000 mg | ORAL_TABLET | Freq: Two times a day (BID) | ORAL | 0 refills | Status: AC

## 2018-11-28 MED ORDER — SUCRALFATE 1 G PO TABS: 1.0000 g | ORAL_TABLET | Freq: Four times a day (QID) | ORAL | 1 refills | Status: DC

## 2018-11-28 NOTE — Progress Notes (Signed)
[name] MD, MRCP(U.K) [address]  [address]  [place], [place] [address]  Main: [phone]  Fax: [phone]   Primary Care Physician: Franciso Bend, NP  Primary Gastroenterologist:  Dr. [name]   Chief Complaint  Patient presents with  . Follow-up    Iron Deficiency Anemia    HPI: Aubert Okino is a 28 y.o. male   Summary of history :  Referred by Dr Cathie Hoops in Hematology for anemia and seen on 10/30/2018. Recent ER visit and was found have microcytic anemia. Ferritin of 4 . 2 years back Hb 13 grams with MCV 72. Rectal bleeding: few times when he wiped, sometimes a few spots and at one time a clot Used to take a lot of [employer], stopped last year. Took it daily for a few years. Some epigastric pain, usually on and off , acid reflux in the past . Diarrhea for a year, has 3-4 bowel movements a day , semi solid- some blood.    Interval history   10/30/2018-  11/28/2018  10/30/2018: Urine no blood, adno virus in stool PCR positive , Fecal calprotectin 79 (borderline), C diff negative. B12 and folate normal . H pylori breath test negative  TTG Ab-positive   11/08/2018 : EGD+duodena, bx -normal duodenal villous pattern. Severe esophagitis lower end of the esophagus- commenced on PPI BID Colonoscopy : Normal TI, colon bx- normal. Rectum bx also normal .   Was on a regular diet while he had the EGD. Diarrhea still persists 3 times a day - soft , has been having some pain when he eats . Different from the pain when he initially came to see me. Stopped all the Goody powder and no other NSAID's.   On prilosec daily once a day , needs to take TUMS in addition for heartburn.     Current Outpatient Medications  Medication Sig Dispense Refill  . DULoxetine (CYMBALTA) 30 MG capsule Take 30 mg by mouth daily.    Marland Kitchen omeprazole (PRILOSEC) 40 MG capsule Take 1 capsule (40 mg total) by mouth 2 (two) times daily. 180 capsule 1  . CHANTIX CONTINUING MONTH PAK 1 MG tablet See  admin instructions.  2  . cyclobenzaprine (FLEXERIL) 10 MG tablet Take 1 tablet (10 mg total) by mouth 3 (three) times daily as needed for muscle spasms. (Patient not taking: Reported on 10/30/2018) 30 tablet 0  . ferrous sulfate 325 (65 FE) MG EC tablet Take 1 tablet (325 mg total) by mouth daily with breakfast. (Patient not taking: Reported on 11/28/2018) 60 tablet 0  . guaiFENesin-codeine 100-10 MG/5ML syrup TAKE BY MOUTH EVERY 8 HOURS AS NEEDED FOR PAIN    . hydrOXYzine (ATARAX/VISTARIL) 25 MG tablet     . sucralfate (CARAFATE) 1 g tablet Take 1 tablet (1 g total) by mouth 4 (four) times daily. 120 tablet 1   No current facility-administered medications for this visit.     Allergies as of 11/28/2018  . (No Known Allergies)    ROS:  General: Negative for anorexia, weight loss, fever, chills, fatigue, weakness. ENT: Negative for hoarseness, difficulty swallowing , nasal congestion. CV: Negative for chest pain, angina, palpitations, dyspnea on exertion, peripheral edema.  Respiratory: Negative for dyspnea at rest, dyspnea on exertion, cough, sputum, wheezing.  GI: See history of present illness. GU:  Negative for dysuria, hematuria, urinary incontinence, urinary frequency, nocturnal urination.  Endo: Negative for unusual weight change.    Physical Examination:   BP 115/77  Pulse 83   Ht 5\' 7"  (1.702 m)   Wt 180 lb 9.6 oz (81.9 kg)   BMI 28.29 kg/m   General: Well-nourished, well-developed in no acute distress.  Eyes: No icterus. Conjunctivae pink. Mouth: Oropharyngeal mucosa moist and pink , no lesions erythema or exudate. Lungs: Clear to auscultation bilaterally. Non-labored. Heart: Regular rate and rhythm, no murmurs rubs or gallops.  Abdomen: Bowel sounds are normal, nontender, nondistended, no hepatosplenomegaly or masses, no abdominal bruits or hernia , no rebound or guarding.   Extremities: No lower extremity edema. No clubbing or deformities. Neuro: Alert and  oriented x 3.  Grossly intact. Skin: Warm and dry, no jaundice.   Psych: Alert and cooperative, normal mood and affect.   Imaging Studies: No results found.  Assessment and Plan:   Vanessa RalphsRobert Tirpak is a 28 y.o. y/o male here to follow up ` for iron deficiency anemia- no overt blood loss, some NSAID use last year. Some epigastric pain ,diarrhea Celiac TTG positive but normal duodenal bx, colonoscopy with bx were normal. Severe esophagitis seen on recent EGD which may account for the hernia. If anemia still persists after Rx for esophagitis then will consider capsule study of the small bowel.   Plan  1.Repeat EGD to check for healing of the esophagitis in 8 weeks  2. 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 Carafate QID 4. [facility] for repair of hiatal hernia as it is large.   Risks, benefits, alternatives of Givens capsule discussed with patient to include but not limited to the rare risk of Given's capsule becoming lodged in the GI tract requiring surgical removal.  The patient agrees with this plan & consent will be obtained.   Dr Wyline MoodKiran Marlyn Rabine  MD,MRCP [facility](U.K) Follow up in 12 weeks

## 2018-11-29 ENCOUNTER — Other Ambulatory Visit: Payer: Self-pay | Admitting: *Deleted

## 2018-11-29 ENCOUNTER — Inpatient Hospital Stay: Payer: 59 | Attending: Oncology

## 2018-11-29 DIAGNOSIS — I10 Essential (primary) hypertension: Secondary | ICD-10-CM | POA: Insufficient documentation

## 2018-11-29 DIAGNOSIS — R5383 Other fatigue: Secondary | ICD-10-CM | POA: Insufficient documentation

## 2018-11-29 DIAGNOSIS — Z79899 Other long term (current) drug therapy: Secondary | ICD-10-CM | POA: Insufficient documentation

## 2018-11-29 DIAGNOSIS — F17210 Nicotine dependence, cigarettes, uncomplicated: Secondary | ICD-10-CM | POA: Insufficient documentation

## 2018-11-29 DIAGNOSIS — D500 Iron deficiency anemia secondary to blood loss (chronic): Secondary | ICD-10-CM

## 2018-11-29 DIAGNOSIS — K922 Gastrointestinal hemorrhage, unspecified: Secondary | ICD-10-CM | POA: Insufficient documentation

## 2018-11-29 DIAGNOSIS — K449 Diaphragmatic hernia without obstruction or gangrene: Secondary | ICD-10-CM | POA: Insufficient documentation

## 2018-11-29 LAB — CBC WITH DIFFERENTIAL/PLATELET
Abs Immature Granulocytes: 0.09 10*3/uL — ABNORMAL HIGH (ref 0.00–0.07)
Basophils Absolute: 0 10*3/uL (ref 0.0–0.1)
Basophils Relative: 0 %
Eosinophils Absolute: 0.2 10*3/uL (ref 0.0–0.5)
Eosinophils Relative: 2 %
HCT: 49.5 % (ref 39.0–52.0)
Hemoglobin: 15.3 g/dL (ref 13.0–17.0)
Immature Granulocytes: 1 %
Lymphocytes Relative: 23 %
Lymphs Abs: 2.1 10*3/uL (ref 0.7–4.0)
MCH: 26 pg (ref 26.0–34.0)
MCHC: 30.9 g/dL (ref 30.0–36.0)
MCV: 84.2 fL (ref 80.0–100.0)
Monocytes Absolute: 0.8 10*3/uL (ref 0.1–1.0)
Monocytes Relative: 9 %
Neutro Abs: 5.9 10*3/uL (ref 1.7–7.7)
Neutrophils Relative %: 65 %
Platelets: 226 10*3/uL (ref 150–400)
RBC: 5.88 MIL/uL — ABNORMAL HIGH (ref 4.22–5.81)
RDW: 23.3 % — ABNORMAL HIGH (ref 11.5–15.5)
WBC: 9.2 10*3/uL (ref 4.0–10.5)
nRBC: 0 % (ref 0.0–0.2)

## 2018-11-29 LAB — IRON AND TIBC
Iron: 57 ug/dL (ref 45–182)
Saturation Ratios: 16 % — ABNORMAL LOW (ref 17.9–39.5)
TIBC: 368 ug/dL (ref 250–450)
UIBC: 311 ug/dL

## 2018-11-29 LAB — FERRITIN: Ferritin: 56 ng/mL (ref 24–336)

## 2018-12-01 ENCOUNTER — Other Ambulatory Visit: Payer: Self-pay

## 2018-12-01 ENCOUNTER — Encounter: Payer: Self-pay | Admitting: Oncology

## 2018-12-01 ENCOUNTER — Inpatient Hospital Stay (HOSPITAL_BASED_OUTPATIENT_CLINIC_OR_DEPARTMENT_OTHER): Payer: 59 | Admitting: Oncology

## 2018-12-01 ENCOUNTER — Inpatient Hospital Stay: Payer: 59

## 2018-12-01 VITALS — BP 137/90 | HR 82 | Temp 97.4°F | Resp 18 | Wt 181.5 lb

## 2018-12-01 DIAGNOSIS — R5383 Other fatigue: Secondary | ICD-10-CM | POA: Diagnosis not present

## 2018-12-01 DIAGNOSIS — I10 Essential (primary) hypertension: Secondary | ICD-10-CM

## 2018-12-01 DIAGNOSIS — Z79899 Other long term (current) drug therapy: Secondary | ICD-10-CM

## 2018-12-01 DIAGNOSIS — K449 Diaphragmatic hernia without obstruction or gangrene: Secondary | ICD-10-CM | POA: Diagnosis not present

## 2018-12-01 DIAGNOSIS — K922 Gastrointestinal hemorrhage, unspecified: Secondary | ICD-10-CM

## 2018-12-01 DIAGNOSIS — D500 Iron deficiency anemia secondary to blood loss (chronic): Secondary | ICD-10-CM

## 2018-12-01 DIAGNOSIS — F17210 Nicotine dependence, cigarettes, uncomplicated: Secondary | ICD-10-CM

## 2018-12-01 NOTE — Progress Notes (Signed)
Pt here for follow up. Complains of nausea in the mornings. Pt has seen Dr. Tobi Bastos for GI discomfort.

## 2018-12-01 NOTE — Progress Notes (Signed)
Hematology/Oncology follow up note [facility] Telephone:(336) [phone] Fax:(336) [phone]   Patient Care Team: Franciso Bend, NP as PCP - General (Nurse Practitioner)  REFERRING PROVIDER: Franciso Bend, NP CHIEF COMPLAINTS/REASON FOR VISIT:  Evaluation of anemia  HISTORY OF PRESENTING ILLNESS:  Justin Powers is a  28 y.o.  male with PMH listed below who was referred to me for evaluation of anemia Patient recently presented to ED after an episode of "upper chest tightness". Reports having multiple episodes for the past few months.  In ED, he had negative cardiology work up including negative EKG, troponin, hemoglobin was noted to be low at 10.6, microcytic.  He was sent home with presumed diagnosis of iron deficiency anemia and prescribed with iron supplements.  Also reports fatigue, progressively worsened lately.  He followed up with PCP and had additional work up done.  Reviewed patient's recent labs that was done at [facility] office. Labs revealed anemia with hemoglobin of 12, MCV 68, platelet count 378,000,  Differential showed increased basophils,  Iron panel showed saturation of 68%, iron 363, TIBC 537, normal TSH Reviewed patient's previous labs ordered by primary care physician's office, anemia is chronic onset , duration is since  No aggravating or improving factors.   INTERVAL HISTORY Justin Powers is a 28 y.o. male who has above history reviewed by me today presents for follow up visit for management of anemia.  During the interval patient has got Feraheme 510 mg weekly x2. He was also referred to gastroenterology and had GI work-up done. # EGD and colonoscopy on 11/08/2018 Non-bleeding internal hemorrhoids. - The examined portion of the ileum was normal. Biopsied. - The entire examined colon is normal. Biopsied. - The examination was otherwise normal.  .A large hiatal hernia was present, LA Grade D (one or more mucosal breaks involving at  least 75% of esophageal circumference) esophagitis with bleeding was found in the lower third of the esophagus. Normal examined duodenum. Biopsied. Reports still feeling fatigued.  He has been started on omeprazole 40 mg twice daily For his chronic conditions backslash anxiety, patient was started on Cymbalta and hydroxyzine he feels that patient is making him feeling tired. no headache today.  Review of Systems  Constitutional: Negative for appetite change, chills, fatigue, fever and unexpected weight change.  HENT:   Negative for hearing loss and voice change.   Eyes: Negative for eye problems and icterus.  Respiratory: Negative for chest tightness, cough and shortness of breath.   Cardiovascular: Negative for chest pain and leg swelling.  Gastrointestinal: Negative for abdominal distention, abdominal pain and blood in stool.  Endocrine: Negative for hot flashes.  Genitourinary: Negative for difficulty urinating, dysuria and frequency.   Musculoskeletal: Negative for arthralgias.  Skin: Negative for itching and rash.  Neurological: Negative for headaches, light-headedness and numbness.  Hematological: Negative for adenopathy. Does not bruise/bleed easily.  Psychiatric/Behavioral: Negative for confusion.    MEDICAL HISTORY:  Past Medical History:  Diagnosis Date  . Hypertension     SURGICAL HISTORY: Past Surgical History:  Procedure Laterality Date  . COLONOSCOPY WITH PROPOFOL N/A 11/08/2018   Procedure: COLONOSCOPY WITH PROPOFOL;  Surgeon: [name], MD;  Location: [facility] ENDOSCOPY;  Service: Gastroenterology;  Laterality: N/A;  . ESOPHAGOGASTRODUODENOSCOPY (EGD) WITH PROPOFOL N/A 11/08/2018   Procedure: ESOPHAGOGASTRODUODENOSCOPY (EGD) WITH PROPOFOL;  Surgeon: [name], MD;  Location: [facility] ENDOSCOPY;  Service: Gastroenterology;  Laterality: N/A;  . wisdon tooth removal      SOCIAL HISTORY: Social History   Socioeconomic  History  . Marital status: Married    Spouse name:  Not on file  . Number of children: Not on file  . Years of education: Not on file  . Highest education level: Not on file  Occupational History  . Not on file  Social Needs  . Financial resource strain: Not on file  . Food insecurity:    Worry: Not on file    Inability: Not on file  . Transportation needs:    Medical: Not on file    Non-medical: Not on file  Tobacco Use  . Smoking status: Current Every Day Smoker    Packs/day: 1.00    Types: Cigarettes  . Smokeless tobacco: Never Used  Substance and Sexual Activity  . Alcohol use: Yes    Comment: Occassionally   . Drug use: Never  . Sexual activity: Not on file  Lifestyle  . Physical activity:    Days per week: Not on file    Minutes per session: Not on file  . Stress: Not on file  Relationships  . Social connections:    Talks on phone: Not on file    Gets together: Not on file    Attends religious service: Not on file    Active member of club or organization: Not on file    Attends meetings of clubs or organizations: Not on file    Relationship status: Not on file  . Intimate partner violence:    Fear of current or ex partner: Not on file    Emotionally abused: Not on file    Physically abused: Not on file    Forced sexual activity: Not on file  Other Topics Concern  . Not on file  Social History Narrative  . Not on file    FAMILY HISTORY: Family History  Problem Relation Age of Onset  . Crohn's disease Mother   . Hypertension Mother   . Breast cancer Maternal Aunt   . Bladder Cancer Maternal Grandfather   . Cervical cancer Other   . Non-Hodgkin's lymphoma Other     ALLERGIES:  has No Known Allergies.  MEDICATIONS:  Current Outpatient Medications  Medication Sig Dispense Refill  . CHANTIX CONTINUING MONTH PAK 1 MG tablet See admin instructions.  2  . cyclobenzaprine (FLEXERIL) 10 MG tablet Take 1 tablet (10 mg total) by mouth 3 (three) times daily as needed for muscle spasms. (Patient not taking:  Reported on 10/30/2018) 30 tablet 0  . DULoxetine (CYMBALTA) 30 MG capsule Take 30 mg by mouth daily.    . ferrous sulfate 325 (65 FE) MG EC tablet Take 1 tablet (325 mg total) by mouth daily with breakfast. (Patient not taking: Reported on 11/28/2018) 60 tablet 0  . guaiFENesin-codeine 100-10 MG/5ML syrup TAKE BY MOUTH EVERY 8 HOURS AS NEEDED FOR PAIN    . hydrOXYzine (ATARAX/VISTARIL) 25 MG tablet     . omeprazole (PRILOSEC) 40 MG capsule Take 1 capsule (40 mg total) by mouth 2 (two) times daily. 180 capsule 1  . rifaximin (XIFAXAN) 550 MG TABS tablet Take 1 tablet (550 mg total) by mouth 2 (two) times daily for 14 days. 28 tablet 0  . sucralfate (CARAFATE) 1 g tablet Take 1 tablet (1 g total) by mouth 4 (four) times daily. 120 tablet 1   No current facility-administered medications for this visit.      PHYSICAL EXAMINATION: ECOG PERFORMANCE STATUS: 1 - Symptomatic but completely ambulatory Vitals:   12/01/18 1332  BP: 137/90  Pulse:  82  Resp: 18  Temp: (!) 97.4 F (36.3 C)   Filed Weights   12/01/18 1332  Weight: 181 lb 8 oz (82.3 kg)    Physical Exam Constitutional:      General: He is not in acute distress. HENT:     Head: Normocephalic and atraumatic.  Eyes:     General: No scleral icterus.    Pupils: Pupils are equal, round, and reactive to light.  Neck:     Musculoskeletal: Normal range of motion and neck supple.  Cardiovascular:     Rate and Rhythm: Normal rate and regular rhythm.     Heart sounds: Normal heart sounds.  Pulmonary:     Effort: Pulmonary effort is normal. No respiratory distress.     Breath sounds: No wheezing.  Abdominal:     General: Bowel sounds are normal. There is no distension.     Palpations: Abdomen is soft. There is no mass.     Tenderness: There is no abdominal tenderness.  Musculoskeletal: Normal range of motion.        General: No deformity.  Skin:    General: Skin is warm and dry.     Coloration: Skin is not pale.      Findings: No erythema or rash.  Neurological:     Mental Status: He is alert and oriented to person, place, and time.     Cranial Nerves: No cranial nerve deficit.     Coordination: Coordination normal.  Psychiatric:        Behavior: Behavior normal.        Thought Content: Thought content normal.      LABORATORY DATA:  I have reviewed the data as listed Lab Results  Component Value Date   WBC 9.2 11/29/2018   HGB 15.3 11/29/2018   HCT 49.5 11/29/2018   MCV 84.2 11/29/2018   PLT 226 11/29/2018   Recent Labs    08/29/18 2239  NA 140  K 3.1*  CL 106  CO2 23  GLUCOSE 117*  BUN 16  CREATININE 1.01  CALCIUM 9.6  GFRNONAA >60  GFRAA >60   Iron/TIBC/Ferritin/ %Sat    Component Value Date/Time   IRON 57 11/29/2018 1112   TIBC 368 11/29/2018 1112   FERRITIN 56 11/29/2018 1112   IRONPCTSAT 16 (L) 11/29/2018 1112        ASSESSMENT & PLAN:  1. Iron deficiency anemia due to chronic blood loss   2. Gastrointestinal hemorrhage, unspecified gastrointestinal hemorrhage type   3. Other fatigue    Labs are reviewed and discussed. Hemoglobin has improved to 15.3, completely normalized Iron panel showed ferritin improved to 56, TIBC 368.  Iron saturation 16. Hold additional IV iron for now.  Fatigue, etiology unknown.  Persistent despite complete resolution of hemoglobin and improved iron stores. Check TSH.  Return to clinic for MD assessment and lab prior-CBC, iron, TIBC, ferritin in 3 months. +/-Feraheme.  Orders Placed This Encounter  Procedures  . CBC with Differential/Platelet    Standing Status:   Future    Standing Expiration Date:   12/02/2019  . Ferritin    Standing Status:   Future    Standing Expiration Date:   12/02/2019  . Iron and TIBC    Standing Status:   Future    Standing Expiration Date:   12/02/2019  . TSH    Standing Status:   Future    Standing Expiration Date:   04/01/2019    All questions were answered. The patient knows  to call the clinic  with any problems questions or concerns.  Return of visit: 3 months  Rickard Patience, MD, PhD Hematology Oncology [facility] at [facility] Pager- [phone] 12/01/2018

## 2018-12-08 ENCOUNTER — Other Ambulatory Visit: Payer: Self-pay

## 2018-12-08 DIAGNOSIS — G8929 Other chronic pain: Secondary | ICD-10-CM

## 2018-12-08 DIAGNOSIS — R1013 Epigastric pain: Principal | ICD-10-CM

## 2018-12-10 DIAGNOSIS — R1310 Dysphagia, unspecified: Secondary | ICD-10-CM

## 2019-01-15 ENCOUNTER — Telehealth: Payer: Self-pay | Admitting: Gastroenterology

## 2019-01-15 NOTE — Telephone Encounter (Signed)
Unable to leave message to offer televist with Dr. Tobi Bastos

## 2019-01-18 ENCOUNTER — Ambulatory Visit (INDEPENDENT_AMBULATORY_CARE_PROVIDER_SITE_OTHER): Payer: 59 | Admitting: Gastroenterology

## 2019-01-18 DIAGNOSIS — R1310 Dysphagia, unspecified: Secondary | ICD-10-CM

## 2019-01-18 DIAGNOSIS — K209 Esophagitis, unspecified without bleeding: Secondary | ICD-10-CM

## 2019-01-18 DIAGNOSIS — R197 Diarrhea, unspecified: Secondary | ICD-10-CM | POA: Diagnosis not present

## 2019-01-18 DIAGNOSIS — D500 Iron deficiency anemia secondary to blood loss (chronic): Secondary | ICD-10-CM | POA: Diagnosis not present

## 2019-01-18 NOTE — Progress Notes (Signed)
Justin Powers , MD [address]  [address]  [place], [place] [address]  Main: [phone]  Fax: [phone]   Primary Care Physician: Franciso Bend, NP  Virtual Visit via  Video  Note  I connected with patient on 01/18/19 at 11:00 AM EDT by Video and verified that I am speaking with the correct person using two identifiers.   I discussed the limitations, risks, security and privacy concerns of performing an evaluation and management service by Video  and the availability of in person appointments. I also discussed with the patient that there may be a patient responsible charge related to this service. The patient expressed understanding and agreed to proceed.  Location of Patient: Home Location of Provider: Home Persons involved: Patient and provider only   History of Present Illness: Chief Complaint  Patient presents with  . Dysphagia    HPI: Justin Powers is a 28 y.o. male    Summary of history :  Referred by Dr Cathie Hoops in Hematology for anemia and seen on 10/30/2018. Recent ER visit and was found have microcytic anemia. Ferritin of 4 . 2 years back Hb 13 grams with MCV 72.Rectal bleeding:few times when he wiped, sometimes a few spots and at one time a clot.Used to take a lot of [employer], stopped last year. Took it daily for a few years. Some epigastric pain, usually on and off , acid reflux in the past . Diarrhea for a year, has 3-4 bowel movements a day , semi solid- some blood.   10/30/2018: Urine no blood, adno virus in stool PCR positive , Fecal calprotectin 79 (borderline), C diff negative. B12 and folate normal . H pylori breath test negative,TTG Ab-positive   11/08/2018 : EGD+duodena, bx -normal duodenal villous pattern. Severe esophagitis lower end of the esophagus- commenced on PPI BID Colonoscopy : Normal TI, colon bx- normal. Rectum bx also normal .    Interval history 11/28/2018-01/18/2019  He says that when he swallows feels like it gets stuck.  Every single time he eats has to drink something . Heartburn has resolved. On prilosec BID. Some diarrhea in the morning every now and then , better after the course of Xifaxan which he states helped. Says he used to have issues with swallowing in the past but resolved.   CBC Latest Ref Rng & Units 11/29/2018 10/04/2018 09/19/2018  WBC 4.0 - 10.5 K/uL 9.2 7.3 9.3  Hemoglobin 13.0 - 17.0 g/dL 88.7 12.4(L) 12.1(L)  Hematocrit 39.0 - 52.0 % 49.5 42.4 40.8  Platelets 150 - 400 K/uL 226 234 346     Was on a regular diet while he had the EGD. Diarrhea still persists 3 times a day - soft , has been having some pain when he eats . Different from the pain when he initially came to see me. Stopped all the Goody powder and no other NSAID's.   On prilosec daily once a day , needs to take TUMS in addition for heartburn.       Current Outpatient Medications  Medication Sig Dispense Refill  . hydrOXYzine (ATARAX/VISTARIL) 25 MG tablet     . omeprazole (PRILOSEC) 40 MG capsule Take 1 capsule (40 mg total) by mouth 2 (two) times daily. 180 capsule 1  . sucralfate (CARAFATE) 1 g tablet Take 1 tablet (1 g total) by mouth 4 (four) times daily. 120 tablet 1  . CHANTIX CONTINUING MONTH PAK 1 MG tablet See admin instructions.  2  . cyclobenzaprine (FLEXERIL) 10  MG tablet Take 1 tablet (10 mg total) by mouth 3 (three) times daily as needed for muscle spasms. (Patient not taking: Reported on 10/30/2018) 30 tablet 0  . DULoxetine (CYMBALTA) 30 MG capsule Take 30 mg by mouth daily.    . ferrous sulfate 325 (65 FE) MG EC tablet Take 1 tablet (325 mg total) by mouth daily with breakfast. (Patient not taking: Reported on 11/28/2018) 60 tablet 0  . guaiFENesin-codeine 100-10 MG/5ML syrup TAKE BY MOUTH EVERY 8 HOURS AS NEEDED FOR PAIN     No current facility-administered medications for this visit.     Allergies as of 01/18/2019  . (No Known Allergies)    Review of Systems:    All systems reviewed and  negative except where noted in HPI.   Observations/Objective:  Labs: CMP     Component Value Date/Time   NA 140 08/29/2018 2239   K 3.1 (L) 08/29/2018 2239   CL 106 08/29/2018 2239   CO2 23 08/29/2018 2239   GLUCOSE 117 (H) 08/29/2018 2239   BUN 16 08/29/2018 2239   CREATININE 1.01 08/29/2018 2239   CALCIUM 9.6 08/29/2018 2239   PROT 7.6 03/06/2016 1240   ALBUMIN 3.9 03/06/2016 1240   AST 45 (H) 03/06/2016 1240   ALT 18 03/06/2016 1240   ALKPHOS 39 03/06/2016 1240   BILITOT 0.3 03/06/2016 1240   GFRNONAA >60 08/29/2018 2239   GFRAA >60 08/29/2018 2239   Lab Results  Component Value Date   WBC 9.2 11/29/2018   HGB 15.3 11/29/2018   HCT 49.5 11/29/2018   MCV 84.2 11/29/2018   PLT 226 11/29/2018    Imaging Studies: No results found.  Assessment and Plan:   Justin Powers is a 28 y.o. y/o male  here to follow up  for iron deficiency anemia,diarrhea - Severe esophagitis seen on recent EGD which may account for the anemia which has normalized for now , if there is a further drop then  will consider capsule study of the small bowel. Diarrhea has resolved after a course of Xifaxan for IBS-D. HE has had issues with swallowing likely secondary to esophageal stricture from severe reflux,.   Plan  1.Did have appointment on Tuesday at [facility] to discuss hiatal; hernia repair has been p[ostponed to may due to COVID 2. Offered EGD for dilation of possible stricture- he is not keen presently due to the COVID- will plan in 4 weeks depending on status of COVID. Video visit prior to procedure . 3. Continue on Prilosec BID, discussed life style changes.    Follow Up Instructions:    I discussed the assessment and treatment plan with the patient. The patient was provided an opportunity to ask questions and all were answered. The patient agreed with the plan and demonstrated an understanding of the instructions.   The patient was advised to call back or seek an in-person evaluation if the  symptoms worsen or if the condition fails to improve as anticipated.  I provided 10 minutes of face-to-face time during this encounter.  Dr Justin Mood MD,MRCP [facility]) Gastroenterology/Hepatology Pager: [phone]   Speech recognition software was used to dictate this note.

## 2019-02-26 ENCOUNTER — Ambulatory Visit (INDEPENDENT_AMBULATORY_CARE_PROVIDER_SITE_OTHER): Payer: 59 | Admitting: Gastroenterology

## 2019-02-26 ENCOUNTER — Other Ambulatory Visit: Payer: Self-pay

## 2019-02-26 ENCOUNTER — Ambulatory Visit: Payer: 59 | Admitting: Gastroenterology

## 2019-02-26 ENCOUNTER — Telehealth: Payer: Self-pay

## 2019-02-26 ENCOUNTER — Inpatient Hospital Stay: Payer: 59 | Attending: Oncology

## 2019-02-26 DIAGNOSIS — Z79899 Other long term (current) drug therapy: Secondary | ICD-10-CM | POA: Insufficient documentation

## 2019-02-26 DIAGNOSIS — D508 Other iron deficiency anemias: Secondary | ICD-10-CM | POA: Insufficient documentation

## 2019-02-26 DIAGNOSIS — Z01812 Encounter for preprocedural laboratory examination: Secondary | ICD-10-CM

## 2019-02-26 DIAGNOSIS — F17210 Nicotine dependence, cigarettes, uncomplicated: Secondary | ICD-10-CM | POA: Insufficient documentation

## 2019-02-26 DIAGNOSIS — R1310 Dysphagia, unspecified: Secondary | ICD-10-CM

## 2019-02-26 DIAGNOSIS — I10 Essential (primary) hypertension: Secondary | ICD-10-CM | POA: Diagnosis not present

## 2019-02-26 DIAGNOSIS — K209 Esophagitis, unspecified: Secondary | ICD-10-CM | POA: Insufficient documentation

## 2019-02-26 DIAGNOSIS — Z803 Family history of malignant neoplasm of breast: Secondary | ICD-10-CM | POA: Diagnosis not present

## 2019-02-26 DIAGNOSIS — D500 Iron deficiency anemia secondary to blood loss (chronic): Secondary | ICD-10-CM

## 2019-02-26 LAB — CBC WITH DIFFERENTIAL/PLATELET
Abs Immature Granulocytes: 0.03 10*3/uL (ref 0.00–0.07)
Basophils Absolute: 0.1 10*3/uL (ref 0.0–0.1)
Basophils Relative: 1 %
Eosinophils Absolute: 0.1 10*3/uL (ref 0.0–0.5)
Eosinophils Relative: 1 %
HCT: 44.9 % (ref 39.0–52.0)
Hemoglobin: 14.8 g/dL (ref 13.0–17.0)
Immature Granulocytes: 0 %
Lymphocytes Relative: 20 %
Lymphs Abs: 1.5 10*3/uL (ref 0.7–4.0)
MCH: 28.7 pg (ref 26.0–34.0)
MCHC: 33 g/dL (ref 30.0–36.0)
MCV: 87 fL (ref 80.0–100.0)
Monocytes Absolute: 0.6 10*3/uL (ref 0.1–1.0)
Monocytes Relative: 8 %
Neutro Abs: 5.4 10*3/uL (ref 1.7–7.7)
Neutrophils Relative %: 70 %
Platelets: 224 10*3/uL (ref 150–400)
RBC: 5.16 MIL/uL (ref 4.22–5.81)
RDW: 14.2 % (ref 11.5–15.5)
WBC: 7.7 10*3/uL (ref 4.0–10.5)
nRBC: 0 % (ref 0.0–0.2)

## 2019-02-26 LAB — FERRITIN: Ferritin: 10 ng/mL — ABNORMAL LOW (ref 24–336)

## 2019-02-26 LAB — IRON AND TIBC
Iron: 100 ug/dL (ref 45–182)
Saturation Ratios: 20 % (ref 17.9–39.5)
TIBC: 514 ug/dL — ABNORMAL HIGH (ref 250–450)
UIBC: 414 ug/dL

## 2019-02-26 LAB — TSH: TSH: 0.527 u[IU]/mL (ref 0.350–4.500)

## 2019-02-26 NOTE — Progress Notes (Signed)
Pt is aware of COVID lab test required 3 days prior to procedure. Pt is aware of testing site, day, and time.

## 2019-02-26 NOTE — Telephone Encounter (Signed)
Patient called in returning call

## 2019-02-26 NOTE — Telephone Encounter (Signed)
Called pt to pre-chart for today's e-visit with Dr. Anna  Unable to contact. LVM to return call 

## 2019-02-26 NOTE — Telephone Encounter (Signed)
Spoke with pt and was able to pre-chart for today's visit. 

## 2019-02-26 NOTE — Progress Notes (Signed)
[name] , MD [address]  [address]  [place], [place] [address]  Main: [phone]  Fax: [phone]   Primary Care Physician: Franciso Bend, NP  Virtual Visit via Video Note  I connected with patient on 02/26/19 at 11:00 AM EDT by video and verified that I am speaking with the correct person using two identifiers.   I discussed the limitations, risks, security and privacy concerns of performing an evaluation and management service by video  and the availability of in person appointments. I also discussed with the patient that there may be a patient responsible charge related to this service. The patient expressed understanding and agreed to proceed.  Location of Patient: Home Location of Provider: Home Persons involved: Patient and provider only   History of Present Illness: Chief complaint: Dysphagia, esophagitis, diarrhea- Follow up   HPI: Justin Powers is a 28 y.o. male   Summary of history :  Referred by Dr Cathie Hoops in Hematology for iron deficiency anemiaand seen on 10/30/2018.. 2 years back Hb 13 grams with MCV 72.Rectal bleeding:few times when he wiped, sometimes a few spots and at one time a clot.Used to take a lot of [employer], stopped last year. Took it daily for a few years. Some epigastric pain, usually on and off , acid reflux in the past . Diarrhea for a year, has 3-4 bowel movements a day , semi solid- some blood.   10/30/2018: Urine no blood, adno virus in stool PCR positive , Fecal calprotectin 79 (borderline), C diff negative. B12 and folate normal . H pylori breath test negative,TTG Ab-positive   11/08/2018 : EGD+duodena, bx -normal duodenal villous pattern.Severe esophagitis lower end of the esophagus- commenced on PPI BID Colonoscopy : Normal TI, colon bx- normal. Rectum bx also normal .  Diarrhea better after course of xifaxan in 12/2018.   Interval history 01/18/2019-02/26/2019   Food still gets stuck. Due next week to discuss  with surgeon regarding hiatal hernia. Diarrhea is better. Has 3-4 bowel movements a day .   Current Outpatient Medications  Medication Sig Dispense Refill  . CHANTIX CONTINUING MONTH PAK 1 MG tablet See admin instructions.  2  . cyclobenzaprine (FLEXERIL) 10 MG tablet Take 1 tablet (10 mg total) by mouth 3 (three) times daily as needed for muscle spasms. (Patient not taking: Reported on 10/30/2018) 30 tablet 0  . DULoxetine (CYMBALTA) 30 MG capsule Take 30 mg by mouth daily.    . ferrous sulfate 325 (65 FE) MG EC tablet Take 1 tablet (325 mg total) by mouth daily with breakfast. (Patient not taking: Reported on 11/28/2018) 60 tablet 0  . guaiFENesin-codeine 100-10 MG/5ML syrup TAKE BY MOUTH EVERY 8 HOURS AS NEEDED FOR PAIN    . hydrOXYzine (ATARAX/VISTARIL) 25 MG tablet     . omeprazole (PRILOSEC) 40 MG capsule Take 1 capsule (40 mg total) by mouth 2 (two) times daily. 180 capsule 1  . sucralfate (CARAFATE) 1 g tablet Take 1 tablet (1 g total) by mouth 4 (four) times daily. 120 tablet 1   No current facility-administered medications for this visit.     Allergies as of 02/26/2019  . (No Known Allergies)    Review of Systems:    All systems reviewed and negative except where noted in HPI.  General Appearance:    Alert, cooperative, no distress, appears stated age  Head:    Normocephalic, without obvious abnormality, atraumatic  Eyes:    PERRL, conjunctiva/corneas clear,  Ears:  Grossly normal hearing    Neurologic:  Grossly normal    Observations/Objective:  Labs: CMP     Component Value Date/Time   NA 140 08/29/2018 2239   K 3.1 (L) 08/29/2018 2239   CL 106 08/29/2018 2239   CO2 23 08/29/2018 2239   GLUCOSE 117 (H) 08/29/2018 2239   BUN 16 08/29/2018 2239   CREATININE 1.01 08/29/2018 2239   CALCIUM 9.6 08/29/2018 2239   PROT 7.6 03/06/2016 1240   ALBUMIN 3.9 03/06/2016 1240   AST 45 (H) 03/06/2016 1240   ALT 18 03/06/2016 1240   ALKPHOS 39 03/06/2016 1240    BILITOT 0.3 03/06/2016 1240   GFRNONAA >60 08/29/2018 2239   GFRAA >60 08/29/2018 2239   Lab Results  Component Value Date   WBC 9.2 11/29/2018   HGB 15.3 11/29/2018   HCT 49.5 11/29/2018   MCV 84.2 11/29/2018   PLT 226 11/29/2018    Imaging Studies: No results found.  Assessment and Plan:   Justin Powers is a 28 y.o. y/o male  here to follow up for iron deficiency anemia,diarrhea - Severe esophagitis seen on recent EGD which may account for the anemia which has normalized for now , if there is a further drop then  will consider capsule study of the small bowel.Diarrhea has improved  after a course of Xifaxan for IBS-D. HE has had issues with swallowing likely secondary to esophageal stricture from severe reflux,.   Plan  1. EGD to check for healing of esophagitis, dilate any stricture , continue PPI BID and f/u with [facility] to discuss hiatal hernia surgery.    I have discussed alternative options, risks & benefits,  which include, but are not limited to, bleeding, infection, perforation,respiratory complication & drug reaction.  The patient agrees with this plan & written consent will be obtained.        I discussed the assessment and treatment plan with the patient. The patient was provided an opportunity to ask questions and all were answered. The patient agreed with the plan and demonstrated an understanding of the instructions.   The patient was advised to call back or seek an in-person evaluation if the symptoms worsen or if the condition fails to improve as anticipated.   Dr Wyline MoodKiran Mysty Kielty MD,MRCP [facility] Gastroenterology/Hepatology Pager: [phone]   Speech recognition software was used to dictate this note.

## 2019-02-27 ENCOUNTER — Inpatient Hospital Stay (HOSPITAL_BASED_OUTPATIENT_CLINIC_OR_DEPARTMENT_OTHER): Payer: 59 | Admitting: Oncology

## 2019-02-27 ENCOUNTER — Encounter: Payer: Self-pay | Admitting: Oncology

## 2019-02-27 ENCOUNTER — Other Ambulatory Visit: Payer: Self-pay

## 2019-02-27 ENCOUNTER — Inpatient Hospital Stay: Payer: 59

## 2019-02-27 DIAGNOSIS — K209 Esophagitis, unspecified without bleeding: Secondary | ICD-10-CM

## 2019-02-27 DIAGNOSIS — D500 Iron deficiency anemia secondary to blood loss (chronic): Secondary | ICD-10-CM

## 2019-02-27 DIAGNOSIS — Z01812 Encounter for preprocedural laboratory examination: Secondary | ICD-10-CM

## 2019-02-27 NOTE — Progress Notes (Signed)
Called patient today for Televisit via [employer]. Patient states no new concerns today.

## 2019-02-27 NOTE — Progress Notes (Signed)
HEMATOLOGY-ONCOLOGY TeleHEALTH VISIT PROGRESS NOTE  I connected with Justin Powers on 02/27/19 at  1:15 PM EDT by video enabled telemedicine visit and verified that I am speaking with the correct person using two identifiers. I discussed the limitations, risks, security and privacy concerns of performing an evaluation and management service by telemedicine and the availability of in-person appointments. I also discussed with the patient that there may be a patient responsible charge related to this service. The patient expressed understanding and agreed to proceed.   Other persons participating in the visit and their role in the encounter:  Diamantina Monks, CMA, check in patient     Patient's location: Home  Provider's location: Home office Chief Complaint: Follow-up for management of iron deficiency anemia.   INTERVAL HISTORY Justin Powers is a 28 y.o. male who has above history reviewed by me today presents for follow up visit for management of iron deficiency anemia Problems and complaints are listed below:  Patient was last seen by me on December 01, 2018. Fatigue, feels slightly more tired than baseline.  He has received IV iron treatment in the past. Chronic diarrhea has improved.  3-4 bowel movement daily.  Denies hematochezia, hematuria, hematemesis, epistaxis, black tarry stool or easy bruising.  He has followed up by gastroenterology Dr. Tobi Bastos, last seen on 02/26/2019.  Plan for repeating EGD to check esophagitis status.    Review of Systems  Constitutional: Positive for fatigue. Negative for appetite change, chills, fever and unexpected weight change.  HENT:   Negative for hearing loss and voice change.   Eyes: Negative for eye problems and icterus.  Respiratory: Negative for chest tightness, cough and shortness of breath.   Cardiovascular: Negative for chest pain and leg swelling.  Gastrointestinal: Negative for abdominal distention and abdominal pain.  Endocrine: Negative  for hot flashes.  Genitourinary: Negative for difficulty urinating, dysuria and frequency.   Musculoskeletal: Negative for arthralgias.  Skin: Negative for itching and rash.  Neurological: Negative for light-headedness and numbness.  Hematological: Negative for adenopathy. Does not bruise/bleed easily.  Psychiatric/Behavioral: Negative for confusion.    Past Medical History:  Diagnosis Date  . Hypertension    Past Surgical History:  Procedure Laterality Date  . COLONOSCOPY WITH PROPOFOL N/A 11/08/2018   Procedure: COLONOSCOPY WITH PROPOFOL;  Surgeon: [name], MD;  Location: [facility] ENDOSCOPY;  Service: Gastroenterology;  Laterality: N/A;  . ESOPHAGOGASTRODUODENOSCOPY (EGD) WITH PROPOFOL N/A 11/08/2018   Procedure: ESOPHAGOGASTRODUODENOSCOPY (EGD) WITH PROPOFOL;  Surgeon: [name], MD;  Location: [facility] ENDOSCOPY;  Service: Gastroenterology;  Laterality: N/A;  . wisdon tooth removal      Family History  Problem Relation Age of Onset  . Crohn's disease Mother   . Hypertension Mother   . Breast cancer Maternal Aunt   . Bladder Cancer Maternal Grandfather   . Cervical cancer Other   . Non-Hodgkin's lymphoma Other     Social History   Socioeconomic History  . Marital status: Married    Spouse name: Not on file  . Number of children: Not on file  . Years of education: Not on file  . Highest education level: Not on file  Occupational History  . Not on file  Social Needs  . Financial resource strain: Not on file  . Food insecurity:    Worry: Not on file    Inability: Not on file  . Transportation needs:    Medical: Not on file    Non-medical: Not on file  Tobacco Use  . Smoking status: Current  Every Day Smoker    Packs/day: 1.00    Types: Cigarettes  . Smokeless tobacco: Never Used  Substance and Sexual Activity  . Alcohol use: Yes    Comment: Occassionally   . Drug use: Never  . Sexual activity: Not on file  Lifestyle  . Physical activity:    Days per week: Not on  file    Minutes per session: Not on file  . Stress: Not on file  Relationships  . Social connections:    Talks on phone: Not on file    Gets together: Not on file    Attends religious service: Not on file    Active member of club or organization: Not on file    Attends meetings of clubs or organizations: Not on file    Relationship status: Not on file  . Intimate partner violence:    Fear of current or ex partner: Not on file    Emotionally abused: Not on file    Physically abused: Not on file    Forced sexual activity: Not on file  Other Topics Concern  . Not on file  Social History Narrative  . Not on file    Current Outpatient Medications on File Prior to Visit  Medication Sig Dispense Refill  . albuterol (VENTOLIN HFA) 108 (90 Base) MCG/ACT inhaler 2 PUFF(S) INHALATION EVERY FOUR HOURS AS NEEDED    . BREO ELLIPTA 100-25 MCG/INH AEPB 1 PUFF(S) INHALATION EVERY DAY    . DULoxetine (CYMBALTA) 30 MG capsule Take 30 mg by mouth daily.    . hydrOXYzine (ATARAX/VISTARIL) 25 MG tablet     . CHANTIX CONTINUING MONTH PAK 1 MG tablet See admin instructions.  2  . omeprazole (PRILOSEC) 40 MG capsule Take 1 capsule (40 mg total) by mouth 2 (two) times daily. 180 capsule 1   No current facility-administered medications on file prior to visit.     No Known Allergies     Observations/Objective: There were no vitals filed for this visit. There is no height or weight on file to calculate BMI.  Physical Exam  Constitutional: He is oriented to person, place, and time. No distress.  HENT:  Head: Normocephalic and atraumatic.  Pulmonary/Chest: Effort normal.  Neurological: He is alert and oriented to person, place, and time.  Psychiatric: Affect normal.    CBC    Component Value Date/Time   WBC 7.7 02/26/2019 1437   RBC 5.16 02/26/2019 1437   HGB 14.8 02/26/2019 1437   HCT 44.9 02/26/2019 1437   PLT 224 02/26/2019 1437   MCV 87.0 02/26/2019 1437   MCH 28.7 02/26/2019 1437    MCHC 33.0 02/26/2019 1437   RDW 14.2 02/26/2019 1437   LYMPHSABS 1.5 02/26/2019 1437   MONOABS 0.6 02/26/2019 1437   EOSABS 0.1 02/26/2019 1437   BASOSABS 0.1 02/26/2019 1437    CMP     Component Value Date/Time   NA 140 08/29/2018 2239   K 3.1 (L) 08/29/2018 2239   CL 106 08/29/2018 2239   CO2 23 08/29/2018 2239   GLUCOSE 117 (H) 08/29/2018 2239   BUN 16 08/29/2018 2239   CREATININE 1.01 08/29/2018 2239   CALCIUM 9.6 08/29/2018 2239   PROT 7.6 03/06/2016 1240   ALBUMIN 3.9 03/06/2016 1240   AST 45 (H) 03/06/2016 1240   ALT 18 03/06/2016 1240   ALKPHOS 39 03/06/2016 1240   BILITOT 0.3 03/06/2016 1240   GFRNONAA >60 08/29/2018 2239   GFRAA >60 08/29/2018 2239  Assessment and Plan: 1. Iron deficiency anemia due to chronic blood loss   2. Esophagitis     Labs are reviewed and discussed with patient.  Hemoglobin 14.8, stable. Iron panel reviewed, ferritin decreased at 10, with normal iron saturation at 20.  Elevated TIBC 514. Patient may have underlying functional iron deficiency.  Recommend continue watch.   Due to COVID-19 pandemic, I will hold additional IV Feraheme treatment at this point given that he is not overtly anemic.  He cannot tolerate oral iron supplementation due to headache. Closely monitor iron level and CBC. Continue follow-up with gastroenterology for repeat EGD to evaluate the status of esophagitis.  Follow Up Instructions: Repeat labs in 6 weeks.  Lab MD 3 months +/-IV Feraheme   I discussed the assessment and treatment plan with the patient. The patient was provided an opportunity to ask questions and all were answered. The patient agreed with the plan and demonstrated an understanding of the instructions.  The patient was advised to call back or seek an in-person evaluation if the symptoms worsen or if the condition fails to improve as anticipated.   I provided 15 minutes of face-to-face video visit time during this encounter, and > 50% was spent  counseling as documented under my assessment & plan.  Rickard Patience, MD 02/27/2019 10:41 PM

## 2019-03-02 ENCOUNTER — Other Ambulatory Visit
Admission: RE | Admit: 2019-03-02 | Discharge: 2019-03-02 | Disposition: A | Discharge status: Home / Self Care | Payer: 59 | Source: Ambulatory Visit | Attending: Gastroenterology | Admitting: Gastroenterology

## 2019-03-02 ENCOUNTER — Other Ambulatory Visit: Payer: Self-pay

## 2019-03-02 DIAGNOSIS — Z1159 Encounter for screening for other viral diseases: Secondary | ICD-10-CM | POA: Insufficient documentation

## 2019-03-03 LAB — NOVEL CORONAVIRUS, NAA (HOSP ORDER, SEND-OUT TO REF LAB; TAT 18-24 HRS): SARS-CoV-2, NAA: NOT DETECTED

## 2019-03-05 ENCOUNTER — Encounter: Payer: Self-pay | Admitting: *Deleted

## 2019-03-06 ENCOUNTER — Ambulatory Visit: Payer: 59 | Admitting: Anesthesiology

## 2019-03-06 ENCOUNTER — Encounter: Payer: Self-pay | Admitting: *Deleted

## 2019-03-06 ENCOUNTER — Encounter
Admission: RE | Disposition: A | Discharge status: Home / Self Care | Payer: Self-pay | Source: Home / Self Care | Attending: Gastroenterology

## 2019-03-06 ENCOUNTER — Ambulatory Visit
Admission: RE | Admit: 2019-03-06 | Discharge: 2019-03-06 | Disposition: A | Discharge status: Home / Self Care | Payer: 59 | Attending: Gastroenterology | Admitting: Gastroenterology

## 2019-03-06 ENCOUNTER — Other Ambulatory Visit: Payer: Self-pay

## 2019-03-06 DIAGNOSIS — K210 Gastro-esophageal reflux disease with esophagitis: Secondary | ICD-10-CM | POA: Diagnosis not present

## 2019-03-06 DIAGNOSIS — Z7951 Long term (current) use of inhaled steroids: Secondary | ICD-10-CM | POA: Insufficient documentation

## 2019-03-06 DIAGNOSIS — I10 Essential (primary) hypertension: Secondary | ICD-10-CM | POA: Diagnosis not present

## 2019-03-06 DIAGNOSIS — K449 Diaphragmatic hernia without obstruction or gangrene: Secondary | ICD-10-CM | POA: Insufficient documentation

## 2019-03-06 DIAGNOSIS — Z79899 Other long term (current) drug therapy: Secondary | ICD-10-CM | POA: Insufficient documentation

## 2019-03-06 DIAGNOSIS — R1310 Dysphagia, unspecified: Secondary | ICD-10-CM | POA: Diagnosis not present

## 2019-03-06 DIAGNOSIS — Z8711 Personal history of peptic ulcer disease: Secondary | ICD-10-CM | POA: Insufficient documentation

## 2019-03-06 DIAGNOSIS — F17210 Nicotine dependence, cigarettes, uncomplicated: Secondary | ICD-10-CM | POA: Insufficient documentation

## 2019-03-06 DIAGNOSIS — K222 Esophageal obstruction: Secondary | ICD-10-CM | POA: Insufficient documentation

## 2019-03-06 HISTORY — PX: ESOPHAGOGASTRODUODENOSCOPY (EGD) WITH PROPOFOL: SHX5813

## 2019-03-06 HISTORY — DX: Anemia, unspecified: D64.9

## 2019-03-06 SURGERY — ESOPHAGOGASTRODUODENOSCOPY (EGD) WITH PROPOFOL
Anesthesia: General

## 2019-03-06 MED ORDER — SODIUM CHLORIDE 0.9 % IV SOLN
INTRAVENOUS | Status: DC
  Administered 2019-03-06: 13:00:00 via INTRAVENOUS

## 2019-03-06 MED ORDER — PROPOFOL 10 MG/ML IV BOLUS
INTRAVENOUS | Status: DC | PRN
  Administered 2019-03-06: 100 mg via INTRAVENOUS
  Administered 2019-03-06: 70 mg via INTRAVENOUS
  Administered 2019-03-06: 60 mg via INTRAVENOUS

## 2019-03-06 MED ORDER — SODIUM CHLORIDE 0.9 % IV SOLN
INTRAVENOUS | Status: DC | PRN
  Administered 2019-03-06: 13:00:00 via INTRAVENOUS

## 2019-03-06 MED ORDER — PROPOFOL 500 MG/50ML IV EMUL
INTRAVENOUS | Status: DC | PRN
  Administered 2019-03-06: 175 ug/kg/min via INTRAVENOUS

## 2019-03-06 NOTE — Anesthesia Preprocedure Evaluation (Signed)
Anesthesia Evaluation  Patient identified by MRN, date of birth, ID band Patient awake    Reviewed: Allergy & Precautions, NPO status , Patient's Chart, lab work & pertinent test results  History of Anesthesia Complications Negative for: history of anesthetic complications  Airway Mallampati: II  TM Distance: >3 FB Neck ROM: Full    Dental no notable dental hx.    Pulmonary neg COPD, Current Smoker,    breath sounds clear to auscultation- rhonchi (-) wheezing      Cardiovascular hypertension, (-) CAD, (-) Past MI, (-) Cardiac Stents and (-) CABG  Rhythm:Regular Rate:Normal - Systolic murmurs and - Diastolic murmurs    Neuro/Psych negative neurological ROS  negative psych ROS   GI/Hepatic Neg liver ROS, PUD,   Endo/Other  negative endocrine ROSneg diabetes  Renal/GU negative Renal ROS     Musculoskeletal negative musculoskeletal ROS (+)   Abdominal (+) + obese,   Peds  Hematology  (+) anemia ,   Anesthesia Other Findings Past Medical History: No date: Anemia No date: Hypertension   Reproductive/Obstetrics                             Anesthesia Physical Anesthesia Plan  ASA: II  Anesthesia Plan: General   Post-op Pain Management:    Induction: Intravenous  PONV Risk Score and Plan: 0 and Propofol infusion  Airway Management Planned: Natural Airway  Additional Equipment:   Intra-op Plan:   Post-operative Plan:   Informed Consent: I have reviewed the patients History and Physical, chart, labs and discussed the procedure including the risks, benefits and alternatives for the proposed anesthesia with the patient or authorized representative who has indicated his/her understanding and acceptance.     Dental advisory given  Plan Discussed with: CRNA and Anesthesiologist  Anesthesia Plan Comments:         Anesthesia Quick Evaluation

## 2019-03-06 NOTE — Transfer of Care (Signed)
Immediate Anesthesia Transfer of Care Note  Patient: Justin Powers  Procedure(s) Performed: ESOPHAGOGASTRODUODENOSCOPY (EGD) WITH PROPOFOL with Dilation (N/A )  Patient Location: PACU  Anesthesia Type:General  Level of Consciousness: awake, alert  and oriented  Airway & Oxygen Therapy: Patient Spontanous Breathing and Patient connected to nasal cannula oxygen  Post-op Assessment: Report given to RN and Post -op Vital signs reviewed and stable  Post vital signs: Reviewed and stable  Last Vitals:  Vitals Value Taken Time  BP    Temp    Pulse    Resp    SpO2      Last Pain:  Vitals:   03/06/19 1220  TempSrc: Tympanic  PainSc: 0-No pain         Complications: No apparent anesthesia complications

## 2019-03-06 NOTE — Anesthesia Postprocedure Evaluation (Signed)
Anesthesia Post Note  Patient: Justin Powers  Procedure(s) Performed: ESOPHAGOGASTRODUODENOSCOPY (EGD) WITH PROPOFOL with Dilation (N/A )  Patient location during evaluation: Endoscopy Anesthesia Type: General Level of consciousness: awake and alert and oriented Pain management: pain level controlled Vital Signs Assessment: post-procedure vital signs reviewed and stable Respiratory status: spontaneous breathing Cardiovascular status: blood pressure returned to baseline Anesthetic complications: no     Last Vitals:  Vitals:   03/06/19 1324 03/06/19 1334  BP: (!) 153/96 (!) 148/103  Pulse: 64 68  Resp: 18 15  Temp:    SpO2: 100% 100%    Last Pain:  Vitals:   03/06/19 1324  TempSrc:   PainSc: 0-No pain                 Roby Spalla

## 2019-03-06 NOTE — Op Note (Signed)
[facility] Gastroenterology Patient Name: Justin RalphsRobert Powers Procedure Date: 03/06/2019 12:38 PM MRN: [id number] Account #: [id number] Date of Birth: 12/23/1990 Admit Type: Outpatient Age: 28 [facility] Gender: Male Note Status: Finalized Procedure:            Upper GI endoscopy Indications:          Dysphagia Providers:            Wyline MoodKiran Steve Gregg MD, MD Referring MD:         Lise AuerJennifer L. Aundria Rudogers (Referring MD) Medicines:            Monitored Anesthesia Care Complications:        No immediate complications. Procedure:            Pre-Anesthesia Assessment:                       - Prior to the procedure, a History and Physical was                        performed, and patient medications, allergies and                        sensitivities were reviewed. The patient's tolerance of                        previous anesthesia was reviewed.                       - The risks and benefits of the procedure and the                        sedation options and risks were discussed with the                        patient. All questions were answered and informed                        consent was obtained.                       - ASA Grade Assessment: II - A patient with mild                        systemic disease.                       After obtaining informed consent, the endoscope was                        passed under direct vision. Throughout the procedure,                        the patient's blood pressure, pulse, and oxygen                        saturations were monitored continuously. The Endoscope                        was introduced through the mouth, and advanced to the  third part of duodenum. The upper GI endoscopy was                        accomplished with ease. The patient tolerated the                        procedure. Findings:      The examined duodenum was normal.      A large hiatal hernia was present.      One  benign-appearing, intrinsic severe stenosis was found at the       gastroesophageal junction. This stenosis measured 13 cm (in length). The       stenosis was traversed. A TTS dilator was passed through the scope.       Dilation with a 12-13.5-15 mm balloon dilator was performed to 15 mm.       The dilation site was examined and showed moderate improvement in       luminal narrowing. Biopsies were obtained from the proximal and distal       esophagus with cold forceps for histology of suspected eosinophilic       esophagitis.      LA Grade A (one or more mucosal breaks less than 5 mm, not extending       between tops of 2 mucosal folds) esophagitis with no bleeding was found       in the lower third of the esophagus.      The exam was otherwise without abnormality. Impression:           - Normal examined duodenum.                       - Large hiatal hernia.                       - Benign-appearing esophageal stenosis. Dilated.                        Biopsied.                       - LA Grade A reflux esophagitis.                       - The examination was otherwise normal. Recommendation:       - Discharge patient to home (with escort).                       - Resume previous diet.                       - Continue present medications.                       - Await pathology results.                       - Return to my office in 4 weeks. Procedure Code(s):    --- Professional ---                       [phone], Esophagogastroduodenoscopy, flexible, transoral;                        with transendoscopic balloon dilation of esophagus                        (  less than 30 mm diameter)                       43239, 59, Esophagogastroduodenoscopy, flexible,                        transoral; with biopsy, single or multiple CPT copyright 2019 American Medical Association. All rights reserved. The codes documented in this report are preliminary and upon coder review may  be revised to meet  current compliance requirements. [name], MD [name] MD, MD 03/06/2019 1:00:06 PM This report has been signed electronically. Number of Addenda: 0 Note Initiated On: 03/06/2019 12:38 PM      [facility]

## 2019-03-06 NOTE — Anesthesia Post-op Follow-up Note (Signed)
Anesthesia QCDR form completed.        

## 2019-03-06 NOTE — H&P (Signed)
Justin MoodKiran Eyvonne Burchfield, MD [address], [address], [place], [place], [address] [address], [address], [place], [place], [address] Phone: [phone]  Fax: [phone]  Primary Care Physician:  Justin Powers, Justin B, NP   Pre-Procedure History & Physical: HPI:  Justin Powers is a 28 y.o. male is here for an Powers    Past Medical History:  Diagnosis Date  . Anemia   . Hypertension     Past Surgical History:  Procedure Laterality Date  . COLONOSCOPY WITH PROPOFOL N/A 11/08/2018   Procedure: COLONOSCOPY WITH PROPOFOL;  Surgeon: Justin MoodAnna, Westlee Devita, MD;  Location: [facility];  Service: Gastroenterology;  Laterality: N/A;  . ESOPHAGOGASTRODUODENOSCOPY (EGD) WITH PROPOFOL N/A 11/08/2018   Procedure: ESOPHAGOGASTRODUODENOSCOPY (EGD) WITH PROPOFOL;  Surgeon: Justin MoodAnna, Brogan Martis, MD;  Location: [facility];  Service: Gastroenterology;  Laterality: N/A;  . wisdon tooth removal      Prior to Admission medications   Medication Sig Start Date End Date Taking? Authorizing Provider  albuterol (VENTOLIN HFA) 108 (90 Base) MCG/ACT inhaler 2 PUFF(S) INHALATION EVERY FOUR HOURS AS NEEDED 02/19/19  Yes [provider]  BREO ELLIPTA 100-25 MCG/INH AEPB 1 PUFF(S) INHALATION EVERY DAY 02/19/19  Yes [provider]  DULoxetine (CYMBALTA) 30 MG capsule Take 30 mg by mouth daily.   Yes [provider]  hydrOXYzine (ATARAX/VISTARIL) 25 MG tablet  11/02/18  Yes [provider]  omeprazole (PRILOSEC) 40 MG capsule Take 40 mg by mouth daily.   Yes [provider]  CHANTIX CONTINUING MONTH PAK 1 MG tablet See admin instructions. 09/12/18   [provider]  omeprazole (PRILOSEC) 40 MG capsule Take 1 capsule (40 mg total) by mouth 2 (two) times daily. 11/08/18 02/07/19  Justin MoodAnna, Nic Lampe, MD    Allergies as of 02/26/2019  . (No Known Allergies)    Family History  Problem Relation Age of Onset  . Crohn's disease Mother   . Hypertension Mother   . Breast cancer Maternal Aunt    . Bladder Cancer Maternal Grandfather   . Cervical cancer Other   . Non-Hodgkin's lymphoma Other     Social History   Socioeconomic History  . Marital status: Married    Spouse name: Not on file  . Number of children: Not on file  . Years of education: Not on file  . Highest education level: Not on file  Occupational History  . Not on file  Social Needs  . Financial resource strain: Not on file  . Food insecurity:    Worry: Not on file    Inability: Not on file  . Transportation needs:    Medical: Not on file    Non-medical: Not on file  Tobacco Use  . Smoking status: Current Every Day Smoker    Packs/day: 1.00    Types: Cigarettes  . Smokeless tobacco: Never Used  Substance and Sexual Activity  . Alcohol use: Yes    Comment: Occassionally   . Drug use: Never  . Sexual activity: Not on file  Lifestyle  . Physical activity:    Days per week: Not on file    Minutes per session: Not on file  . Stress: Not on file  Relationships  . Social connections:    Talks on phone: Not on file    Gets together: Not on file    Attends religious service: Not on file    Active member of club or organization: Not on file    Attends meetings of clubs or organizations: Not on file  Relationship status: Not on file  . Intimate partner violence:    Fear of current or ex partner: Not on file    Emotionally abused: Not on file    Physically abused: Not on file    Forced sexual activity: Not on file  Other Topics Concern  . Not on file  Social History Narrative  . Not on file    Review of Systems: See HPI, otherwise negative ROS  Physical Exam: BP (!) 164/103   Pulse 74   Temp (!) 96.3 F (35.7 C) (Tympanic)   Resp 18   Ht 5\' 7"  (1.702 m)   Wt 90.7 kg   SpO2 100%   BMI 31.32 kg/m  General:   Alert,  pleasant and cooperative in NAD Head:  Normocephalic and atraumatic. Neck:  Supple; no masses or thyromegaly. Lungs:  Clear throughout to auscultation, normal  respiratory effort.    Heart:  +S1, +S2, Regular rate and rhythm, No edema. Abdomen:  Soft, nontender and nondistended. Normal bowel sounds, without guarding, and without rebound.   Neurologic:  Alert and  oriented x4;  grossly normal neurologically.  Impression/Plan: Justin Powers is here for an Powers  to be performed for  evaluation of dysphagia and esophagitis     Risks, benefits, limitations, and alternatives regarding Powers and dilation have been reviewed with the patient.  Questions have been answered.  All parties agreeable.   Justin Mood, MD  03/06/2019, 12:23 PM

## 2019-03-07 ENCOUNTER — Encounter: Payer: Self-pay | Admitting: Gastroenterology

## 2019-03-07 LAB — SURGICAL PATHOLOGY

## 2019-03-08 ENCOUNTER — Encounter: Payer: Self-pay | Admitting: Gastroenterology

## 2019-03-30 ENCOUNTER — Other Ambulatory Visit: Payer: Self-pay | Admitting: Internal Medicine

## 2019-03-30 DIAGNOSIS — R29810 Facial weakness: Secondary | ICD-10-CM

## 2019-03-30 DIAGNOSIS — I639 Cerebral infarction, unspecified: Secondary | ICD-10-CM

## 2019-04-10 ENCOUNTER — Ambulatory Visit
Admission: RE | Admit: 2019-04-10 | Discharge: 2019-04-10 | Disposition: A | Discharge status: Home / Self Care | Payer: 59 | Source: Ambulatory Visit | Attending: Internal Medicine | Admitting: Internal Medicine

## 2019-04-10 ENCOUNTER — Inpatient Hospital Stay: Payer: 59 | Attending: Oncology

## 2019-04-10 ENCOUNTER — Ambulatory Visit: Payer: 59 | Admitting: Gastroenterology

## 2019-04-10 ENCOUNTER — Other Ambulatory Visit: Payer: Self-pay

## 2019-04-10 DIAGNOSIS — I639 Cerebral infarction, unspecified: Secondary | ICD-10-CM

## 2019-04-10 DIAGNOSIS — R29810 Facial weakness: Secondary | ICD-10-CM

## 2019-04-10 NOTE — Progress Notes (Deleted)
Wyline MoodKiran Vondell Babers MD, MRCP(U.K) [address]  [address]  [place], [place] [address]  Main: [phone]  Fax: [phone]   Primary Care Physician: Franciso Bendogers, Jennifer B, NP  Primary Gastroenterologist:  Dr. Wyline MoodKiran Dax Murguia  Dysphagia Follow up   HPI: Justin Powers is a 28 y.o. male   Summary of history :  Referred by Dr Cathie HoopsYu in Hematology for iron deficiency anemiaand seen on 10/30/2018.. 2 years back Hb 13 grams with MCV 72.Rectal bleeding:few times when he wiped, sometimes a few spots and at one time a clot.Used to take a lot of [employer], stopped last year. Took it daily for a few years. Some epigastric pain, usually on and off , acid reflux in the past . Diarrhea for a year, has 3-4 bowel movements a day , semi solid- some blood.   10/30/2018: Urine no blood, adno virus in stool PCR positive , Fecal calprotectin 79 (borderline), C diff negative. B12 and folate normal . H pylori breath test negative,TTG Ab-positive   11/08/2018 : EGD+duodenal, bx -normal duodenal villous pattern.Severe esophagitis lower end of the esophagus- commenced on PPI BID Colonoscopy : Normal TI, colon bx- normal. Rectum bx also normal .  Diarrhea better after course of xifaxan in 12/2018.   Interval history 02/26/2019 -04/10/2019  03/06/2019: EGD: One benign-appearing, intrinsic severe stenosis was found at the gastroesophageal junction.  Dilation was performed to 15 mm.   Food still gets stuck. Due next week to discuss with surgeon regarding hiatal hernia. Diarrhea is better. Has 3-4 bowel movements a day .No E/o EOE on biopsies.    03/26/2019: Esophageal manometry- 80% swallows failed, severe ineffective esophageal motility.    Current Outpatient Medications  Medication Sig Dispense Refill  . albuterol (VENTOLIN HFA) 108 (90 Base) MCG/ACT inhaler 2 PUFF(S) INHALATION EVERY FOUR HOURS AS NEEDED    . BREO ELLIPTA 100-25 MCG/INH AEPB 1 PUFF(S) INHALATION EVERY DAY    . CHANTIX CONTINUING MONTH PAK  1 MG tablet See admin instructions.  2  . DULoxetine (CYMBALTA) 30 MG capsule Take 30 mg by mouth daily.    . hydrOXYzine (ATARAX/VISTARIL) 25 MG tablet     . omeprazole (PRILOSEC) 40 MG capsule Take 1 capsule (40 mg total) by mouth 2 (two) times daily. 180 capsule 1  . omeprazole (PRILOSEC) 40 MG capsule Take 40 mg by mouth daily.     No current facility-administered medications for this visit.     Allergies as of 04/10/2019  . (No Known Allergies)    ROS:  General: Negative for anorexia, weight loss, fever, chills, fatigue, weakness. ENT: Negative for hoarseness, difficulty swallowing , nasal congestion. CV: Negative for chest pain, angina, palpitations, dyspnea on exertion, peripheral edema.  Respiratory: Negative for dyspnea at rest, dyspnea on exertion, cough, sputum, wheezing.  GI: See history of present illness. GU:  Negative for dysuria, hematuria, urinary incontinence, urinary frequency, nocturnal urination.  Endo: Negative for unusual weight change.    Physical Examination:   There were no vitals taken for this visit.  General: Well-nourished, well-developed in no acute distress.  Eyes: No icterus. Conjunctivae pink. Mouth: Oropharyngeal mucosa moist and pink , no lesions erythema or exudate. Lungs: Clear to auscultation bilaterally. Non-labored. Heart: Regular rate and rhythm, no murmurs rubs or gallops.  Abdomen: Bowel sounds are normal, nontender, nondistended, no hepatosplenomegaly or masses, no abdominal bruits or hernia , no rebound or guarding.   Extremities: No lower extremity edema. No clubbing or deformities. Neuro: Alert and oriented x 3.  Grossly intact. Skin: Warm and dry, no jaundice.   Psych: Alert and cooperative, normal mood and affect.   Imaging Studies: Mr Brain Wo Contrast  Result Date: 04/10/2019 CLINICAL DATA:  Facial weakness.  CVA.  Dysphagia 2 weeks.  Nausea EXAM: MRI HEAD WITHOUT CONTRAST TECHNIQUE: Multiplanar, multiecho pulse sequences  of the brain and surrounding structures were obtained without intravenous contrast. COMPARISON:  None. FINDINGS: Brain: Negative for acute infarct. No significant chronic ischemia. Negative for demyelinating disease. Normal white matter. Negative for hemorrhage or mass. 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 Vascular: Normal arterial flow voids Skull and upper cervical spine: Negative Sinuses/Orbits: Mild mucosal edema paranasal sinuses. Other: Non IMPRESSION: Normal MRI of the brain. Incidental small arachnoid cyst left middle cranial fossa. Electronically Signed   By: Franchot Gallo M.D.   On: 04/10/2019 09:51    Assessment and Plan:   Justin Powers is a 28 y.o. y/o malehere to follow up for iron deficiency anemia,diarrhea- Severe esophagitis seen on recent EGD which may account for the anemia which has normalized for now , if there is a further drop thenwill consider capsule study of the small bowel.Diarrhea has improved  after a course of Xifaxan for IBS-D. HE has had issues with swallowing likely secondary to esophageal stricture from severe reflux,.  Plan  1. Continue PPI BID and f/u with [facility] to discuss hiatal hernia surgery.     Dr Jonathon Bellows  MD,MRCP [facility]) Follow up in ***

## 2019-04-19 ENCOUNTER — Other Ambulatory Visit: Payer: 59

## 2019-04-19 ENCOUNTER — Ambulatory Visit (INDEPENDENT_AMBULATORY_CARE_PROVIDER_SITE_OTHER): Payer: 59 | Admitting: Gastroenterology

## 2019-04-19 ENCOUNTER — Other Ambulatory Visit: Payer: Self-pay

## 2019-04-19 ENCOUNTER — Encounter: Payer: Self-pay | Admitting: Gastroenterology

## 2019-04-19 VITALS — BP 118/87 | HR 85 | Temp 97.7°F | Ht 67.0 in | Wt 207.4 lb

## 2019-04-19 DIAGNOSIS — R1310 Dysphagia, unspecified: Secondary | ICD-10-CM | POA: Diagnosis not present

## 2019-04-19 DIAGNOSIS — K2100 Gastro-esophageal reflux disease with esophagitis, without bleeding: Secondary | ICD-10-CM

## 2019-04-19 DIAGNOSIS — D500 Iron deficiency anemia secondary to blood loss (chronic): Secondary | ICD-10-CM

## 2019-04-19 NOTE — Progress Notes (Signed)
Wyline MoodKiran Aashka Salomone MD, MRCP(U.K) [address]  [address]  [place], [place] [address]  Main: [phone]  Fax: [phone]   Primary Care Physician: Franciso Bendogers, Jennifer B, NP  Primary Gastroenterologist:  Dr. Wyline MoodKiran Xayvion Shirah   No chief complaint on file.   HPI: Justin RalphsRobert Powers is a 28 y.o. male   Summary of history :  Referred by Dr Cathie HoopsYu in Hematology for iron deficiency anemiaand seen on 10/30/2018.. 2 years back Hb 13 grams with MCV 72.Rectal bleeding:few times when he wiped, sometimes a few spots and at one time a clot.Used to take a lot of [employer], stopped last year. Took it daily for a few years. Some epigastric pain, usually on and off , acid reflux in the past . Diarrhea for a year, has 3-4 bowel movements a day , semi solid- some blood.   10/30/2018: Urine no blood, adno virus in stool PCR positive , Fecal calprotectin 79 (borderline), C diff negative. B12 and folate normal . H pylori breath test negative,TTG Ab-positive   11/08/2018 : EGD+duodena, bx -normal duodenal villous pattern.Severe esophagitis lower end of the esophagus- commenced on PPI BID Colonoscopy : Normal TI, colon bx- normal. Rectum bx also normal .  Diarrhea better after course of xifaxan in 12/2018.   Interval history 02/26/2019 -04/19/2019  03/06/2019: EGD- GE junction : stricture dilated to 15 mm .Grade A esophagitis seen and a large hiatal hernia.   03/26/2019 : [facility]: esophageal dysmotility noted  Swallowing was " great for a few weeks" after the dilation then getting slowly worse.  No diarrhea [facility]     Current Outpatient Medications  Medication Sig Dispense Refill  . albuterol (VENTOLIN HFA) 108 (90 Base) MCG/ACT inhaler 2 PUFF(S) INHALATION EVERY FOUR HOURS AS NEEDED    . BREO ELLIPTA 100-25 MCG/INH AEPB 1 PUFF(S) INHALATION EVERY DAY    . CHANTIX CONTINUING MONTH PAK 1 MG tablet See admin instructions.  2  . DULoxetine (CYMBALTA) 30 MG capsule  Take 30 mg by mouth daily.    . hydrOXYzine (ATARAX/VISTARIL) 25 MG tablet     . omeprazole (PRILOSEC) 40 MG capsule Take 1 capsule (40 mg total) by mouth 2 (two) times daily. 180 capsule 1  . omeprazole (PRILOSEC) 40 MG capsule Take 40 mg by mouth daily.     No current facility-administered medications for this visit.     Allergies as of 04/19/2019  . (No Known Allergies)    ROS:  General: Negative for anorexia, weight loss, fever, chills, fatigue, weakness. ENT: Negative for hoarseness, difficulty swallowing , nasal congestion. CV: Negative for chest pain, angina, palpitations, dyspnea on exertion, peripheral edema.  Respiratory: Negative for dyspnea at rest, dyspnea on exertion, cough, sputum, wheezing.  GI: See history of present illness. GU:  Negative for dysuria, hematuria, urinary incontinence, urinary frequency, nocturnal urination.  Endo: Negative for unusual weight change.    Physical Examination:   There were no vitals taken for this visit.  General: Well-nourished, well-developed in no acute distress.  Eyes: No icterus. Conjunctivae pink. Mouth: Oropharyngeal mucosa moist and pink , no lesions erythema or exudate. Lungs: Clear to auscultation bilaterally. Non-labored. Heart: Regular rate and rhythm, no murmurs rubs or gallops.  Abdomen: Bowel sounds are normal, nontender, nondistended, no hepatosplenomegaly or masses, no abdominal bruits or hernia , no rebound or guarding.   Extremities: No lower extremity edema. No clubbing or deformities. Neuro: Alert and oriented x 3.  Grossly intact. Skin: Warm  and dry, no jaundice.   Psych: Alert and cooperative, normal mood and affect.   Imaging Studies: Mr Brain Wo Contrast  Result Date: 04/10/2019 CLINICAL DATA:  Facial weakness.  CVA.  Dysphagia 2 weeks.  Nausea EXAM: MRI HEAD WITHOUT CONTRAST TECHNIQUE: Multiplanar, multiecho pulse sequences of the brain and surrounding structures were obtained without intravenous  contrast. COMPARISON:  None. FINDINGS: Brain: Negative for acute infarct. No significant chronic ischemia. Negative for demyelinating disease. Normal white matter. Negative for hemorrhage or mass. 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 Vascular: Normal arterial flow voids Skull and upper cervical spine: Negative Sinuses/Orbits: Mild mucosal edema paranasal sinuses. Other: Non IMPRESSION: Normal MRI of the brain. Incidental small arachnoid cyst left middle cranial fossa. Electronically Signed   By: Franchot Gallo M.D.   On: 04/10/2019 09:51    Assessment and Plan:   Justin Powers is a 28 y.o. y/o malehere to follow up for iron deficiency anemia,diarrhea- Severe esophagitis seen on recent EGD which may account for the anemia which has normalized for now , if there is a further drop thenwill consider capsule study of the small bowel. HE has had issues with swallowing likely secondary to esophageal stricture from severe reflux,.  Plan  1.Continue PPI, add pepcid at night  2. Repeat EGD with dilation to 18 mm in 2 weeks  3. F/u with [facility] surgeons for hernia repair  I have discussed alternative options, risks & benefits,  which include, but are not limited to, bleeding, infection, perforation,respiratory complication & drug reaction.  The patient agrees with this plan & written consent will be obtained.    Dr Jonathon Bellows  MD,MRCP [facility]) Follow up in 8-12 weeks

## 2019-04-27 ENCOUNTER — Other Ambulatory Visit
Admission: RE | Admit: 2019-04-27 | Discharge: 2019-04-27 | Disposition: A | Discharge status: Home / Self Care | Payer: 59 | Source: Ambulatory Visit | Attending: Gastroenterology | Admitting: Gastroenterology

## 2019-04-27 ENCOUNTER — Other Ambulatory Visit: Payer: Self-pay

## 2019-04-27 DIAGNOSIS — Z01812 Encounter for preprocedural laboratory examination: Secondary | ICD-10-CM | POA: Diagnosis not present

## 2019-04-27 DIAGNOSIS — Z1159 Encounter for screening for other viral diseases: Secondary | ICD-10-CM | POA: Insufficient documentation

## 2019-04-27 LAB — SARS CORONAVIRUS 2 (TAT 6-24 HRS): SARS Coronavirus 2: NEGATIVE

## 2019-05-01 ENCOUNTER — Ambulatory Visit: Payer: 59 | Admitting: Certified Registered"

## 2019-05-01 ENCOUNTER — Encounter: Payer: Self-pay | Admitting: *Deleted

## 2019-05-01 ENCOUNTER — Encounter
Admission: RE | Disposition: A | Discharge status: Home / Self Care | Payer: Self-pay | Source: Home / Self Care | Attending: Gastroenterology

## 2019-05-01 ENCOUNTER — Other Ambulatory Visit: Payer: Self-pay

## 2019-05-01 ENCOUNTER — Ambulatory Visit
Admission: RE | Admit: 2019-05-01 | Discharge: 2019-05-01 | Disposition: A | Discharge status: Home / Self Care | Payer: 59 | Attending: Gastroenterology | Admitting: Gastroenterology

## 2019-05-01 DIAGNOSIS — Z8711 Personal history of peptic ulcer disease: Secondary | ICD-10-CM | POA: Insufficient documentation

## 2019-05-01 DIAGNOSIS — R1310 Dysphagia, unspecified: Secondary | ICD-10-CM

## 2019-05-01 DIAGNOSIS — D500 Iron deficiency anemia secondary to blood loss (chronic): Secondary | ICD-10-CM

## 2019-05-01 DIAGNOSIS — I10 Essential (primary) hypertension: Secondary | ICD-10-CM | POA: Insufficient documentation

## 2019-05-01 DIAGNOSIS — Z79899 Other long term (current) drug therapy: Secondary | ICD-10-CM | POA: Diagnosis not present

## 2019-05-01 DIAGNOSIS — F17210 Nicotine dependence, cigarettes, uncomplicated: Secondary | ICD-10-CM | POA: Diagnosis not present

## 2019-05-01 DIAGNOSIS — Z7951 Long term (current) use of inhaled steroids: Secondary | ICD-10-CM | POA: Diagnosis not present

## 2019-05-01 DIAGNOSIS — K222 Esophageal obstruction: Secondary | ICD-10-CM | POA: Diagnosis not present

## 2019-05-01 DIAGNOSIS — K449 Diaphragmatic hernia without obstruction or gangrene: Secondary | ICD-10-CM | POA: Insufficient documentation

## 2019-05-01 HISTORY — PX: ESOPHAGOGASTRODUODENOSCOPY (EGD) WITH PROPOFOL: SHX5813

## 2019-05-01 SURGERY — ESOPHAGOGASTRODUODENOSCOPY (EGD) WITH PROPOFOL
Anesthesia: General

## 2019-05-01 MED ORDER — LIDOCAINE HCL (CARDIAC) PF 100 MG/5ML IV SOSY
PREFILLED_SYRINGE | INTRAVENOUS | Status: DC | PRN
  Administered 2019-05-01: 100 mg via INTRATRACHEAL

## 2019-05-01 MED ORDER — PROPOFOL 10 MG/ML IV BOLUS
INTRAVENOUS | Status: DC | PRN
  Administered 2019-05-01 (×5): 50 mg via INTRAVENOUS

## 2019-05-01 MED ORDER — SODIUM CHLORIDE 0.9 % IV SOLN
INTRAVENOUS | Status: DC
  Administered 2019-05-01: 1000 mL via INTRAVENOUS

## 2019-05-01 NOTE — Anesthesia Post-op Follow-up Note (Signed)
Anesthesia QCDR form completed.        

## 2019-05-01 NOTE — Op Note (Signed)
[facility] Gastroenterology Patient Name: Justin RalphsRobert Powers Procedure Date: 05/01/2019 10:00 AM MRN: [id number] Account #: [id number] Date of Birth: 12/28/1990 Admit Type: Outpatient Age: 28 [facility] Gender: Male Note Status: Finalized Procedure:            Upper GI endoscopy Indications:          Dysphagia Providers:            Wyline MoodKiran Clydia Nieves MD, MD Referring MD:         Lise AuerJennifer L. Aundria Rudogers (Referring MD) Medicines:            Monitored Anesthesia Care Complications:        No immediate complications. Procedure:            Pre-Anesthesia Assessment:                       - Prior to the procedure, a History and Physical was                        performed, and patient medications, allergies and                        sensitivities were reviewed. The patient's tolerance of                        previous anesthesia was reviewed.                       - The risks and benefits of the procedure and the                        sedation options and risks were discussed with the                        patient. All questions were answered and informed                        consent was obtained.                       - ASA Grade Assessment: II - A patient with mild                        systemic disease.                       After obtaining informed consent, the endoscope was                        passed under direct vision. Throughout the procedure,                        the patient's blood pressure, pulse, and oxygen                        saturations were monitored continuously. The Endoscope                        was introduced through the mouth, and advanced to the  third part of duodenum. The upper GI endoscopy was                        accomplished with ease. The patient tolerated the                        procedure well. Findings:      The examined duodenum was normal.      A large hiatal hernia was present.      One  benign-appearing, intrinsic moderate (circumferential scarring or       stenosis; an endoscope may pass) stenosis was found at the       gastroesophageal junction. This stenosis measured 1.5 cm (inner       diameter) x less than one cm (in length). The stenosis was traversed. A       TTS dilator was passed through the scope. Dilation with a 15-16.5-18 mm       balloon dilator was performed to 18 mm. The dilation site was examined       following endoscope reinsertion and showed moderate mucosal disruption. Impression:           - Normal examined duodenum.                       - Large hiatal hernia.                       - Benign-appearing esophageal stenosis. Dilated.                       - No specimens collected. Recommendation:       - Discharge patient to home (with escort).                       - Resume previous diet.                       - Continue present medications.                       - Return to my office in 4 weeks. Procedure Code(s):    --- Professional ---                       [id number], Esophagogastroduodenoscopy, flexible, transoral;                        with transendoscopic balloon dilation of esophagus                        (less than 30 mm diameter) Diagnosis Code(s):    --- Professional ---                       K44.9, Diaphragmatic hernia without obstruction or                        gangrene                       K22.2, Esophageal obstruction                       R13.10, Dysphagia, unspecified CPT copyright 2019 American Medical Association. All rights reserved. The codes  documented in this report are preliminary and upon coder review may  be revised to meet current compliance requirements. Wyline MoodKiran Samuella Rasool, MD Wyline MoodKiran Rayann Jolley MD, MD 05/01/2019 10:12:49 AM This report has been signed electronically. Number of Addenda: 0 Note Initiated On: 05/01/2019 10:00 AM Estimated Blood Loss: Estimated blood loss: none.      [facility]

## 2019-05-01 NOTE — Transfer of Care (Signed)
Immediate Anesthesia Transfer of Care Note  Patient: Justin Powers  Procedure(s) Performed: ESOPHAGOGASTRODUODENOSCOPY (EGD) WITH PROPOFOL with Dilation (N/A )  Patient Location: Endoscopy Unit  Anesthesia Type:General  Level of Consciousness: awake, alert , oriented and patient cooperative  Airway & Oxygen Therapy: Patient Spontanous Breathing  Post-op Assessment: Report given to RN and Post -op Vital signs reviewed and stable  Post vital signs:   Last Vitals:  Vitals Value Taken Time  BP 147/99 05/01/19 1018  Temp 35.7 C 05/01/19 1010  Pulse 98 05/01/19 1020  Resp 18 05/01/19 1020  SpO2 100 % 05/01/19 1020  Vitals shown include unvalidated device data.  Last Pain:  Vitals:   05/01/19 1010  TempSrc: Tympanic  PainSc:          Complications: No apparent anesthesia complications

## 2019-05-01 NOTE — Anesthesia Preprocedure Evaluation (Signed)
Anesthesia Evaluation  Patient identified by MRN, date of birth, ID band Patient awake    Reviewed: Allergy & Precautions, NPO status , Patient's Chart, lab work & pertinent test results  History of Anesthesia Complications Negative for: history of anesthetic complications  Airway Mallampati: II  TM Distance: >3 FB Neck ROM: Full    Dental no notable dental hx.    Pulmonary neg COPD, Current Smoker,    breath sounds clear to auscultation- rhonchi (-) wheezing      Cardiovascular hypertension, (-) CAD, (-) Past MI, (-) Cardiac Stents and (-) CABG  Rhythm:Regular Rate:Normal - Systolic murmurs and - Diastolic murmurs    Neuro/Psych negative neurological ROS  negative psych ROS   GI/Hepatic Neg liver ROS, PUD,   Endo/Other  negative endocrine ROSneg diabetes  Renal/GU negative Renal ROS     Musculoskeletal negative musculoskeletal ROS (+)   Abdominal (+) + obese,   Peds  Hematology  (+) anemia ,   Anesthesia Other Findings Past Medical History: No date: Anemia No date: Hypertension   Reproductive/Obstetrics                             Anesthesia Physical  Anesthesia Plan  ASA: II  Anesthesia Plan: General   Post-op Pain Management:    Induction: Intravenous  PONV Risk Score and Plan: 0 and Propofol infusion  Airway Management Planned: Natural Airway and Nasal Cannula  Additional Equipment:   Intra-op Plan:   Post-operative Plan:   Informed Consent: I have reviewed the patients History and Physical, chart, labs and discussed the procedure including the risks, benefits and alternatives for the proposed anesthesia with the patient or authorized representative who has indicated his/her understanding and acceptance.     Dental advisory given  Plan Discussed with: CRNA and Anesthesiologist  Anesthesia Plan Comments:         Anesthesia Quick Evaluation

## 2019-05-01 NOTE — H&P (Signed)
Justin MoodKiran Avin Gibbons, MD [address], [address], [place], [place], [address] [address], [address], [place], [place], [address] Phone: [phone]  Fax: [phone]  Primary Care Physician:  Franciso Bendogers, Jennifer B, NP   Pre-Procedure History & Physical: HPI:  Justin RalphsRobert Powers is a 28 y.o. male is here for an endoscopy    Past Medical History:  Diagnosis Date  . Anemia   . Hypertension     Past Surgical History:  Procedure Laterality Date  . COLONOSCOPY WITH PROPOFOL N/A 11/08/2018   Procedure: COLONOSCOPY WITH PROPOFOL;  Surgeon: Justin Powers, Justin Kring, MD;  [facility];  Service: Gastroenterology;  Laterality: N/A;  . ESOPHAGOGASTRODUODENOSCOPY (EGD) WITH PROPOFOL N/A 11/08/2018   Procedure: ESOPHAGOGASTRODUODENOSCOPY (EGD) WITH PROPOFOL;  Surgeon: Justin Powers, Margery Szostak, MD;  [facility];  Service: Gastroenterology;  Laterality: N/A;  . ESOPHAGOGASTRODUODENOSCOPY (EGD) WITH PROPOFOL N/A 03/06/2019   Procedure: ESOPHAGOGASTRODUODENOSCOPY (EGD) WITH PROPOFOL with Dilation;  Surgeon: Justin Powers, Justin Angell, MD;  [facility];  Service: Gastroenterology;  Laterality: N/A;  . wisdon tooth removal      Prior to Admission medications   Medication Sig Start Date End Date Taking? Authorizing Provider  BREO ELLIPTA 100-25 MCG/INH AEPB 1 PUFF(S) INHALATION EVERY DAY 02/19/19  Yes [provider]  DULoxetine (CYMBALTA) 30 MG capsule Take 30 mg by mouth daily.   Yes [provider]  lisinopril (ZESTRIL) 10 MG tablet Take 10 mg by mouth daily. 03/22/19  Yes [provider]  omeprazole (PRILOSEC) 40 MG capsule Take 40 mg by mouth daily.   Yes [provider]  zolpidem (AMBIEN) 5 MG tablet Take 5 mg by mouth at bedtime as needed. 04/02/19  Yes [provider]  albuterol (VENTOLIN HFA) 108 (90 Base) MCG/ACT inhaler 2 PUFF(S) INHALATION EVERY FOUR HOURS AS NEEDED 02/19/19   [provider]  CHANTIX CONTINUING MONTH PAK 1 MG tablet See admin  instructions. 09/12/18   [provider]  hydrOXYzine (ATARAX/VISTARIL) 25 MG tablet  11/02/18   [provider]  metoprolol succinate (TOPROL-XL) 25 MG 24 hr tablet  04/18/19   [provider]  omeprazole (PRILOSEC) 40 MG capsule Take 1 capsule (40 mg total) by mouth 2 (two) times daily. 11/08/18 02/07/19  Justin Powers, Justin Barrie, MD    Allergies as of 04/19/2019  . (No Known Allergies)    Family History  Problem Relation Age of Onset  . Crohn's disease Mother   . Hypertension Mother   . Breast cancer Maternal Aunt   . Bladder Cancer Maternal Grandfather   . Cervical cancer Other   . Non-Hodgkin's lymphoma Other     Social History   Socioeconomic History  . Marital status: Married    Spouse name: Not on file  . Number of children: Not on file  . Years of education: Not on file  . Highest education level: Not on file  Occupational History  . Not on file  Social Needs  . Financial resource strain: Not on file  . Food insecurity    Worry: Not on file    Inability: Not on file  . Transportation needs    Medical: Not on file    Non-medical: Not on file  Tobacco Use  . Smoking status: Current Every Day Smoker    Packs/day: 1.00    Types: Cigarettes  . Smokeless tobacco: Never Used  Substance and Sexual Activity  . Alcohol use: Yes    Comment: Occassionally   . Drug use: Never  .  Sexual activity: Not on file  Lifestyle  . Physical activity    Days per week: Not on file    Minutes per session: Not on file  . Stress: Not on file  Relationships  . Social Herbalist on phone: Not on file    Gets together: Not on file    Attends religious service: Not on file    Active member of club or organization: Not on file    Attends meetings of clubs or organizations: Not on file    Relationship status: Not on file  . Intimate partner violence    Fear of current or ex partner: Not on file    Emotionally abused: Not on file    Physically abused: Not on  file    Forced sexual activity: Not on file  Other Topics Concern  . Not on file  Social History Narrative  . Not on file    Review of Systems: See HPI, otherwise negative ROS  Physical Exam: BP (!) 149/94   Pulse 81   Temp (!) 96.2 F (35.7 C) (Tympanic)   Resp 18   Ht 5\' 7"  (1.702 m)   Wt 93 kg   SpO2 99%   BMI 32.11 kg/m  General:   Alert,  pleasant and cooperative in NAD Head:  Normocephalic and atraumatic. Neck:  Supple; no masses or thyromegaly. Lungs:  Clear throughout to auscultation, normal respiratory effort.    Heart:  +S1, +S2, Regular rate and rhythm, No edema. Abdomen:  Soft, nontender and nondistended. Normal bowel sounds, without guarding, and without rebound.   Neurologic:  Alert and  oriented x4;  grossly normal neurologically.  Impression/Plan: Justin Powers is here for an endoscopy  to be performed for  evaluation of dysphagia    Risks, benefits, limitations, and alternatives regarding endoscopy and dilation have been reviewed with the patient.  Questions have been answered.  All parties agreeable.   Justin Bellows, MD  05/01/2019, 9:59 AM

## 2019-05-02 ENCOUNTER — Encounter: Payer: Self-pay | Admitting: Gastroenterology

## 2019-05-02 NOTE — Anesthesia Postprocedure Evaluation (Signed)
Anesthesia Post Note  Patient: Justin Powers  Procedure(s) Performed: ESOPHAGOGASTRODUODENOSCOPY (EGD) WITH PROPOFOL with Dilation (N/A )  Patient location during evaluation: Endoscopy Anesthesia Type: General Level of consciousness: awake and alert and oriented Pain management: pain level controlled Vital Signs Assessment: post-procedure vital signs reviewed and stable Respiratory status: spontaneous breathing Cardiovascular status: blood pressure returned to baseline Anesthetic complications: no     Last Vitals:  Vitals:   05/01/19 0916 05/01/19 1010  BP: (!) 149/94 (!) 147/99  Pulse: 81 98  Resp: 18 18  Temp: (!) 35.7 C (!) 35.7 C  SpO2: 99% 97%    Last Pain:  Vitals:   05/01/19 1010  TempSrc: Tympanic  PainSc:                  Justin Powers

## 2019-05-19 ENCOUNTER — Other Ambulatory Visit: Payer: Self-pay | Admitting: Gastroenterology

## 2019-05-28 ENCOUNTER — Other Ambulatory Visit: Payer: Self-pay

## 2019-05-28 ENCOUNTER — Inpatient Hospital Stay: Payer: 59 | Attending: Oncology

## 2019-05-28 DIAGNOSIS — K529 Noninfective gastroenteritis and colitis, unspecified: Secondary | ICD-10-CM | POA: Insufficient documentation

## 2019-05-28 DIAGNOSIS — D500 Iron deficiency anemia secondary to blood loss (chronic): Secondary | ICD-10-CM | POA: Diagnosis not present

## 2019-05-28 DIAGNOSIS — F17210 Nicotine dependence, cigarettes, uncomplicated: Secondary | ICD-10-CM | POA: Insufficient documentation

## 2019-05-28 DIAGNOSIS — R5382 Chronic fatigue, unspecified: Secondary | ICD-10-CM | POA: Insufficient documentation

## 2019-05-28 DIAGNOSIS — K449 Diaphragmatic hernia without obstruction or gangrene: Secondary | ICD-10-CM | POA: Diagnosis not present

## 2019-05-28 DIAGNOSIS — I10 Essential (primary) hypertension: Secondary | ICD-10-CM | POA: Insufficient documentation

## 2019-05-28 DIAGNOSIS — Z79899 Other long term (current) drug therapy: Secondary | ICD-10-CM | POA: Diagnosis not present

## 2019-05-28 DIAGNOSIS — Z7951 Long term (current) use of inhaled steroids: Secondary | ICD-10-CM | POA: Diagnosis not present

## 2019-05-28 DIAGNOSIS — E876 Hypokalemia: Secondary | ICD-10-CM | POA: Diagnosis not present

## 2019-05-28 LAB — IRON AND TIBC
Iron: 29 ug/dL — ABNORMAL LOW (ref 45–182)
Saturation Ratios: 6 % — ABNORMAL LOW (ref 17.9–39.5)
TIBC: 464 ug/dL — ABNORMAL HIGH (ref 250–450)
UIBC: 435 ug/dL

## 2019-05-28 LAB — COMPREHENSIVE METABOLIC PANEL
ALT: 34 U/L (ref 0–44)
AST: 56 U/L — ABNORMAL HIGH (ref 15–41)
Albumin: 4 g/dL (ref 3.5–5.0)
Alkaline Phosphatase: 113 U/L (ref 38–126)
Anion gap: 12 (ref 5–15)
BUN: 10 mg/dL (ref 6–20)
CO2: 22 mmol/L (ref 22–32)
Calcium: 8.9 mg/dL (ref 8.9–10.3)
Chloride: 105 mmol/L (ref 98–111)
Creatinine, Ser: 0.84 mg/dL (ref 0.61–1.24)
GFR calc Af Amer: >60 mL/min (ref >60)
GFR calc non Af Amer: >60 mL/min (ref >60)
Glucose, Bld: 97 mg/dL (ref 70–99)
Potassium: 2.7 mmol/L — CL (ref 3.5–5.1)
Sodium: 139 mmol/L (ref 135–145)
Total Bilirubin: 0.3 mg/dL (ref 0.3–1.2)
Total Protein: 7.8 g/dL (ref 6.5–8.1)

## 2019-05-28 LAB — CBC WITH DIFFERENTIAL/PLATELET
Abs Immature Granulocytes: 0.04 10*3/uL (ref 0.00–0.07)
Basophils Absolute: 0 10*3/uL (ref 0.0–0.1)
Basophils Relative: 0 %
Eosinophils Absolute: 0.1 10*3/uL (ref 0.0–0.5)
Eosinophils Relative: 1 %
HCT: 42.4 % (ref 39.0–52.0)
Hemoglobin: 13.9 g/dL (ref 13.0–17.0)
Immature Granulocytes: 0 %
Lymphocytes Relative: 16 %
Lymphs Abs: 1.5 10*3/uL (ref 0.7–4.0)
MCH: 27.9 pg (ref 26.0–34.0)
MCHC: 32.8 g/dL (ref 30.0–36.0)
MCV: 85 fL (ref 80.0–100.0)
Monocytes Absolute: 0.6 10*3/uL (ref 0.1–1.0)
Monocytes Relative: 7 %
Neutro Abs: 7.4 10*3/uL (ref 1.7–7.7)
Neutrophils Relative %: 76 %
Platelets: 221 10*3/uL (ref 150–400)
RBC: 4.99 MIL/uL (ref 4.22–5.81)
RDW: 15.2 % (ref 11.5–15.5)
WBC: 9.7 10*3/uL (ref 4.0–10.5)
nRBC: 0 % (ref 0.0–0.2)

## 2019-05-28 LAB — FERRITIN: Ferritin: 22 ng/mL — ABNORMAL LOW (ref 24–336)

## 2019-05-29 ENCOUNTER — Telehealth: Payer: Self-pay | Admitting: Oncology

## 2019-05-29 NOTE — Telephone Encounter (Signed)
Received critical result of potassium 3.7.  Called patient and he did not answer.  Voice message box was full

## 2019-05-30 ENCOUNTER — Other Ambulatory Visit: Payer: Self-pay

## 2019-05-30 ENCOUNTER — Inpatient Hospital Stay: Payer: 59

## 2019-05-30 ENCOUNTER — Encounter: Payer: Self-pay | Admitting: *Deleted

## 2019-05-30 ENCOUNTER — Encounter: Payer: Self-pay | Admitting: Oncology

## 2019-05-30 ENCOUNTER — Inpatient Hospital Stay (HOSPITAL_BASED_OUTPATIENT_CLINIC_OR_DEPARTMENT_OTHER): Payer: 59 | Admitting: Oncology

## 2019-05-30 VITALS — BP 133/87 | Temp 96.9°F | Resp 16 | Wt 215.7 lb

## 2019-05-30 VITALS — BP 142/83 | HR 91 | Temp 97.0°F | Resp 18

## 2019-05-30 DIAGNOSIS — R5382 Chronic fatigue, unspecified: Secondary | ICD-10-CM | POA: Diagnosis not present

## 2019-05-30 DIAGNOSIS — D500 Iron deficiency anemia secondary to blood loss (chronic): Secondary | ICD-10-CM | POA: Diagnosis not present

## 2019-05-30 DIAGNOSIS — R197 Diarrhea, unspecified: Secondary | ICD-10-CM | POA: Diagnosis not present

## 2019-05-30 DIAGNOSIS — K209 Esophagitis, unspecified without bleeding: Secondary | ICD-10-CM

## 2019-05-30 DIAGNOSIS — E876 Hypokalemia: Secondary | ICD-10-CM

## 2019-05-30 LAB — COMPREHENSIVE METABOLIC PANEL
ALT: 34 U/L (ref 0–44)
AST: 60 U/L — ABNORMAL HIGH (ref 15–41)
Albumin: 4.1 g/dL (ref 3.5–5.0)
Alkaline Phosphatase: 112 U/L (ref 38–126)
Anion gap: 11 (ref 5–15)
BUN: 16 mg/dL (ref 6–20)
CO2: 23 mmol/L (ref 22–32)
Calcium: 9.1 mg/dL (ref 8.9–10.3)
Chloride: 104 mmol/L (ref 98–111)
Creatinine, Ser: 0.92 mg/dL (ref 0.61–1.24)
GFR calc Af Amer: >60 mL/min (ref >60)
GFR calc non Af Amer: >60 mL/min (ref >60)
Glucose, Bld: 128 mg/dL — ABNORMAL HIGH (ref 70–99)
Potassium: 2.9 mmol/L — ABNORMAL LOW (ref 3.5–5.1)
Sodium: 138 mmol/L (ref 135–145)
Total Bilirubin: 0.6 mg/dL (ref 0.3–1.2)
Total Protein: 8 g/dL (ref 6.5–8.1)

## 2019-05-30 MED ORDER — POTASSIUM CHLORIDE CRYS ER 20 MEQ PO TBCR: 20.0000 meq | EXTENDED_RELEASE_TABLET | Freq: Two times a day (BID) | ORAL | 0 refills | Status: DC

## 2019-05-30 MED ORDER — SODIUM CHLORIDE 0.9 % IV SOLN
Freq: Once | INTRAVENOUS | Status: AC
  Administered 2019-05-30: 14:00:00 via INTRAVENOUS
  Filled 2019-05-30: qty 250

## 2019-05-30 MED ORDER — SODIUM CHLORIDE 0.9 % IV SOLN
510.0000 mg | Freq: Once | INTRAVENOUS | Status: AC
  Administered 2019-05-30: 510 mg via INTRAVENOUS
  Filled 2019-05-30: qty 17

## 2019-05-30 NOTE — Progress Notes (Signed)
Pt tolerated infusion well. Pt and VS stable at discharge.  

## 2019-05-30 NOTE — Progress Notes (Signed)
Patient reports that he vomits when he eats.  When he wakes in the mornings he feels like he is having body tremors.  He has been prescribed Zofran by PCP but does not seem to help.  He has not notified PCP or GI doctors about this ongoing issue.

## 2019-05-30 NOTE — Progress Notes (Signed)
Hematology/Oncology follow up note [facility] [phone] Fax:(336) [phone]   Patient Care Team: Franciso Bendogers, Jennifer B, NP as PCP - General (Nurse Practitioner)  REFERRING PROVIDER: Franciso Bendogers, Jennifer B, NP CHIEF COMPLAINTS/REASON FOR VISIT:  Evaluation of anemia  HISTORY OF PRESENTING ILLNESS:  Vanessa RalphsRobert Windle is a  28 y.o.  male with PMH listed below who was referred to me for evaluation of anemia Patient recently presented to ED after an episode of "upper chest tightness". Reports having multiple episodes for the past few months.  In ED, he had negative cardiology work up including negative EKG, troponin, hemoglobin was noted to be low at 10.6, microcytic.  He was sent home with presumed diagnosis of iron deficiency anemia and prescribed with iron supplements.  Also reports fatigue, progressively worsened lately.  He followed up with PCP and had additional work up done.  Reviewed [facility] Labs revealed anemia with hemoglobin of 12, MCV 68, platelet count 378,000,  Differential showed increased basophils,  Iron panel showed saturation of 68%, iron 363, TIBC 537, normal TSH Reviewed patient's previous labs ordered by primary care physician's office, anemia is chronic onset , duration is since  No aggravating or improving factors.   #Previous gastroenterology work-up # EGD and colonoscopy on 11/08/2018 Non-bleeding internal hemorrhoids. - The examined portion of the ileum was normal. Biopsied. - The entire examined colon is normal. Biopsied. - The examination was otherwise normal. .A large hiatal hernia was present, LA Grade D (one or more mucosal breaks involving at least 75% of esophageal circumference) esophagitis with bleeding was found in the lower third of the esophagus. Normal examined duodenum. Biopsied.   INTERVAL HISTORY Vanessa RalphsRobert Garske is a 28 y.o. male who has above history reviewed by me today  presents for follow up visit for management of anemia.  Reports feeling tired and fatigued. Chronic intermittent diarrhea, no exacerbating or alleviating factors.  He was advised by gastroenterology to take Imodium which he has not tried.  On average he having 5-6 loose bowel movement daily.  Denies any fever, chills, abdominal pain, blood in the stool.  #Patient had EGD on 03/06/2019 which showed large hiatal hernia, benign-appearing esophageal stenosis.  Dilated and biopsied.  Esophagitis.  Biopsy results showed stratified squamous epithelium with rare intraepithelial eosinophils and neutrophils, parakeratosis and a basal hyperplasia.  Negative for dysplasia and malignancy. #He had a repeat EGD on 7/14/2020Which showed large hiatal hernia, benign appearance esophageal stenosis.  Dilated. Patient has been on Cymbalta for anxiety. Reports weight gain. He weighed 181 pound on 12/01/2018.  Today he weighed 215 pounds.  Gained 35 pounds during past 6 months.  His blood work was done on 05/28/2019 which showed a critical results of potassium 2.7.  Attempted to call patient and not able to reach him.  Voicemail was full and unable to leave message.   Review of Systems  Constitutional: Positive for appetite change, fatigue and unexpected weight change. Negative for chills and fever.  HENT:   Negative for hearing loss and voice change.   Eyes: Negative for eye problems and icterus.  Respiratory: Negative for chest tightness, cough and shortness of breath.   Cardiovascular: Negative for chest pain and leg swelling.  Gastrointestinal: Positive for diarrhea. Negative for abdominal distention, abdominal pain and blood in stool.  Endocrine: Negative for hot flashes.  Genitourinary: Negative for difficulty urinating, dysuria and frequency.   Musculoskeletal: Negative for arthralgias.  Skin: Negative for itching and rash.  Neurological: Negative for headaches, light-headedness and numbness.  Hematological:  Negative for adenopathy. Does not bruise/bleed easily.  Psychiatric/Behavioral: Negative for confusion. The patient is nervous/anxious.     MEDICAL HISTORY:  Past Medical History:  Diagnosis Date  . Anemia   . Hypertension     SURGICAL HISTORY: Past Surgical History:  Procedure Laterality Date  . COLONOSCOPY WITH PROPOFOL N/A 11/08/2018   Procedure: COLONOSCOPY WITH PROPOFOL;  Surgeon: Jonathon Bellows, MD;  Location: [facility] ENDOSCOPY;  Service: Gastroenterology;  Laterality: N/A;  . ESOPHAGOGASTRODUODENOSCOPY (EGD) WITH PROPOFOL N/A 11/08/2018   Procedure: ESOPHAGOGASTRODUODENOSCOPY (EGD) WITH PROPOFOL;  Surgeon: Jonathon Bellows, MD;  Location: [facility] ENDOSCOPY;  Service: Gastroenterology;  Laterality: N/A;  . ESOPHAGOGASTRODUODENOSCOPY (EGD) WITH PROPOFOL N/A 03/06/2019   Procedure: ESOPHAGOGASTRODUODENOSCOPY (EGD) WITH PROPOFOL with Dilation;  Surgeon: Jonathon Bellows, MD;  Location: [facility] ENDOSCOPY;  Service: Gastroenterology;  Laterality: N/A;  . ESOPHAGOGASTRODUODENOSCOPY (EGD) WITH PROPOFOL N/A 05/01/2019   Procedure: ESOPHAGOGASTRODUODENOSCOPY (EGD) WITH PROPOFOL with Dilation;  Surgeon: Jonathon Bellows, MD;  Location: [facility] ENDOSCOPY;  Service: Gastroenterology;  Laterality: N/A;  . wisdon tooth removal      SOCIAL HISTORY: Social History   Socioeconomic History  . Marital status: Married    Spouse name: Not on file  . Number of children: Not on file  . Years of education: Not on file  . Highest education level: Not on file  Occupational History  . Not on file  Social Needs  . Financial resource strain: Not on file  . Food insecurity    Worry: Not on file    Inability: Not on file  . Transportation needs    Medical: Not on file    Non-medical: Not on file  Tobacco Use  . Smoking status: Current Every Day Smoker    Packs/day: 1.00    Types: Cigarettes  . Smokeless tobacco: Never Used  Substance and Sexual Activity  . Alcohol use: Yes    Comment: Occassionally   . Drug use: Never  .  Sexual activity: Not on file  Lifestyle  . Physical activity    Days per week: Not on file    Minutes per session: Not on file  . Stress: Not on file  Relationships  . Social Herbalist on phone: Not on file    Gets together: Not on file    Attends religious service: Not on file    Active member of club or organization: Not on file    Attends meetings of clubs or organizations: Not on file    Relationship status: Not on file  . Intimate partner violence    Fear of current or ex partner: Not on file    Emotionally abused: Not on file    Physically abused: Not on file    Forced sexual activity: Not on file  Other Topics Concern  . Not on file  Social History Narrative  . Not on file    FAMILY HISTORY: Family History  Problem Relation Age of Onset  . Crohn's disease Mother   . Hypertension Mother   . Breast cancer Maternal Aunt   . Bladder Cancer Maternal Grandfather   . Cervical cancer Other   . Non-Hodgkin's lymphoma Other     ALLERGIES:  has No Known Allergies.  MEDICATIONS:  Current Outpatient Medications  Medication Sig Dispense Refill  . atorvastatin (LIPITOR) 20 MG tablet Take 20 mg by mouth daily.    . DULoxetine (CYMBALTA) 30 MG capsule Take 30 mg by mouth daily.    Marland Kitchen  hydrOXYzine (ATARAX/VISTARIL) 25 MG tablet     . lisinopril (ZESTRIL) 10 MG tablet Take 10 mg by mouth daily.    . metoprolol succinate (TOPROL-XL) 25 MG 24 hr tablet     . omeprazole (PRILOSEC) 40 MG capsule TAKE 1 CAPSULE BY MOUTH TWICE A DAY 60 capsule 5  . ondansetron (ZOFRAN) 8 MG tablet Take 8 mg by mouth every 8 (eight) hours as needed.    . zolpidem (AMBIEN) 5 MG tablet Take 5 mg by mouth at bedtime as needed.    Marland Kitchen. albuterol (VENTOLIN HFA) 108 (90 Base) MCG/ACT inhaler 2 PUFF(S) INHALATION EVERY FOUR HOURS AS NEEDED    . BREO ELLIPTA 100-25 MCG/INH AEPB 1 PUFF(S) INHALATION EVERY DAY    . CHANTIX CONTINUING MONTH PAK 1 MG tablet See admin instructions.  2  . potassium chloride  SA (K-DUR) 20 MEQ tablet Take 1 tablet (20 mEq total) by mouth 2 (two) times daily. 28 tablet 0   No current facility-administered medications for this visit.      PHYSICAL EXAMINATION: ECOG PERFORMANCE STATUS: 1 - Symptomatic but completely ambulatory Vitals:   05/30/19 1309  BP: 133/87  Resp: 16  Temp: (!) 96.9 F (36.1 C)   Filed Weights   05/30/19 1309  Weight: 215 lb 11.2 oz (97.8 kg)    Physical Exam Constitutional:      General: He is not in acute distress.    Appearance: He is obese.  HENT:     Head: Normocephalic and atraumatic.  Eyes:     General: No scleral icterus.    Pupils: Pupils are equal, round, and reactive to light.  Neck:     Musculoskeletal: Normal range of motion and neck supple.  Cardiovascular:     Rate and Rhythm: Normal rate and regular rhythm.     Heart sounds: Normal heart sounds.  Pulmonary:     Effort: Pulmonary effort is normal. No respiratory distress.     Breath sounds: No wheezing.  Abdominal:     General: Bowel sounds are normal. There is no distension.     Palpations: Abdomen is soft. There is no mass.     Tenderness: There is no abdominal tenderness.  Musculoskeletal: Normal range of motion.        General: No deformity.  Skin:    General: Skin is warm and dry.     Coloration: Skin is not pale.     Findings: No erythema or rash.  Neurological:     Mental Status: He is alert and oriented to person, place, and time.     Cranial Nerves: No cranial nerve deficit.     Coordination: Coordination normal.  Psychiatric:        Behavior: Behavior normal.        Thought Content: Thought content normal.      LABORATORY DATA:  I have reviewed the data as listed Lab Results  Component Value Date   WBC 9.7 05/28/2019   HGB 13.9 05/28/2019   HCT 42.4 05/28/2019   MCV 85.0 05/28/2019   PLT 221 05/28/2019   Recent Labs    08/29/18 2239 05/28/19 1520 05/30/19 1234  NA 140 139 138  K 3.1* 2.7* 2.9*  CL 106 105 104  CO2 23 22  23   GLUCOSE 117* 97 128*  BUN 16 10 16   CREATININE 1.01 0.84 0.92  CALCIUM 9.6 8.9 9.1  GFRNONAA >60 >60 >60  GFRAA >60 >60 >60  PROT  --  7.8 8.0  ALBUMIN  --  4.0 4.1  AST  --  56* 60*  ALT  --  34 34  ALKPHOS  --  113 112  BILITOT  --  0.3 0.6   Iron/TIBC/Ferritin/ %Sat    Component Value Date/Time   IRON 29 (L) 05/28/2019 1520   TIBC 464 (H) 05/28/2019 1520   FERRITIN 22 (L) 05/28/2019 1520   IRONPCTSAT 6 (L) 05/28/2019 1520        ASSESSMENT & PLAN:  1. Iron deficiency anemia due to chronic blood loss   2. Chronic fatigue   3. Diarrhea, unspecified type   4. Hypokalemia    #Labs are reviewed and discussed with patient. Hemoglobin remained stable at 13.9.  Slightly lower than 3 months ago Iron panel showed increased TIBC 464, saturation ratio 6, ferritin 22. Recommend proceed with IV Feraheme 510 mg x 1.  #Hypokalemia, Patient was also contacted today to see if he can come earlier to do IV potassium treatments.  He was not able to come due to being at work. Recommend patient to start taking potassium chloride 40 mEq daily.  Today's potassium was 2.9. Hypokalemia is likely secondary to ongoing chronic diarrhea. I encourage patient to call primary care provider and have a follow-up potassium level check in the next week to ensure safe level of potassium   #Chronic diarrhea, for about a year.  Recommend patient to utilize Imodium for diarrhea.  Continue follow-up with gastroenterology.   Return to clinic for MD assessment and lab prior-CBC, iron, TIBC, ferritin in 4 months. +/-Feraheme.   All questions were answered. The patient knows to call the clinic with any problems questions or concerns. Cc Dr.Anna  Cc Franciso Bendogers, Jennifer B, NP   Rickard PatienceZhou Lilyth Lawyer, MD, PhD Hematology Oncology [facility] Pager- [phone] 05/30/2019

## 2019-06-04 ENCOUNTER — Telehealth: Payer: Self-pay | Admitting: Gastroenterology

## 2019-06-04 NOTE — Telephone Encounter (Signed)
Patient called & states he needs   omeprazole (PRILOSEC) 40 MG capsule   Call into the [place]. He is out of his medication. Please call patient.

## 2019-06-05 NOTE — Telephone Encounter (Signed)
Spoke with pt and informed him that the Prilosec refills were sent to his preferred pharmacy on 05-21-19. Pt states we will need to contact his insurance as there was an issue with the ordered quantity.

## 2019-06-05 NOTE — Telephone Encounter (Signed)
Please go ahead and prescribe

## 2019-06-06 NOTE — Telephone Encounter (Signed)
Spoke with pharmacy regarding issue with pt prescription and was told pt insurance will only cover a certain quantity, they plan to begin a prior auth and will fax it to our office.

## 2019-06-20 NOTE — Telephone Encounter (Signed)
Patient called stating his insurance will only cover 60 pills of omeprazole (PRILOSEC) 40 MG capsule per year. He states needs something. Please call [facility] [place] [facility].

## 2019-06-21 NOTE — Telephone Encounter (Signed)
Spoke with pt and informed him that his insurance has approved coverage for the omeprazole for the next 12 months.

## 2019-07-03 ENCOUNTER — Other Ambulatory Visit: Payer: Self-pay

## 2019-07-03 ENCOUNTER — Telehealth: Payer: Self-pay | Admitting: Gastroenterology

## 2019-07-03 ENCOUNTER — Ambulatory Visit: Payer: 59 | Admitting: Gastroenterology

## 2019-07-03 ENCOUNTER — Ambulatory Visit (INDEPENDENT_AMBULATORY_CARE_PROVIDER_SITE_OTHER): Payer: 59 | Admitting: Gastroenterology

## 2019-07-03 DIAGNOSIS — K2100 Gastro-esophageal reflux disease with esophagitis, without bleeding: Secondary | ICD-10-CM

## 2019-07-03 MED ORDER — SUCRALFATE 1 GM/10ML PO SUSP: 1.0000 g | Freq: Four times a day (QID) | ORAL | 2 refills | Status: DC

## 2019-07-03 NOTE — Telephone Encounter (Signed)
Spoke with pt and informed him that prescription for Carafate has been sent to his preferred pharmacy.

## 2019-07-03 NOTE — Telephone Encounter (Signed)
Pt had virtual visit with Dr. Vicente Males today he wants a call about the medication that as send in today please call pt

## 2019-07-03 NOTE — Progress Notes (Addendum)
Justin Powers , MD [address]  [place]  [place], [place] [address]  Main: [phone]  Fax: [phone]   Primary Care Physician: Justin Bon, NP  Virtual Visit via Telephone Note  I connected with patient on 07/03/19 at  1:45 PM EDT by telephone and verified that I am speaking with the correct person using two identifiers.   I discussed the limitations, risks, security and privacy concerns of performing an evaluation and management service by telephone and the availability of in person appointments. I also discussed with the patient that there may be a patient responsible charge related to this service. The patient expressed understanding and agreed to proceed.  Location of Patient: Home Location of Provider: Home Persons involved: Patient and provider only   History of Present Illness: Nausea and vomiting  HPI: Justin Powers is a 28 y.o. male who is followed for IBS D, severe esophageal reflux.  Esophageal stricture.  Summary of history :  Referred by Dr Justin Powers in Hematology foriron [place] seen on 10/30/2018.. 2 years back Hb 13 grams with MCV 72.Rectal bleeding:few times when he wiped, sometimes a few spots and at one time a clot.Used to take a lot of [employer], stopped last year. Took it daily for a few years. Some epigastric pain, usually on and off , acid reflux in the past . Diarrhea for a year, has 3-4 bowel movements a day , semi solid- some blood.   10/30/2018: Urine no blood, adno virus in stool PCR positive , Fecal calprotectin 79 (borderline), C diff negative. B12 and folate normal . H pylori breath test negative,TTG Ab-positive   11/08/2018 : EGD+duodena, bx -normal duodenal villous pattern.Severe esophagitis lower end of the esophagus- commenced on PPI BID Colonoscopy : Normal TI, colon bx- normal. Rectum bx also normal . 03/06/2019: EGD- GE junction : stricture dilated to 15 mm .Grade A esophagitis seen and a large hiatal  hernia.   03/26/2019 : Esophageal motility study at [facility]: esophageal dysmotility noted  Diarrhea better after course of xifaxan in 12/2018.  Interval history 04/19/2019-07/03/2019  05/01/2019: EGD: GE junction stricture dilated to 18 mm  No issues with swallowing at this point of time but having lots of nausea and vomiting.  Significant acid reflux.  Has been taking omeprazole only once a day although was advised to take it twice a day.  He states that his insurance did not approve twice a day PPI.  He has been out of work since yesterday due to his symptoms.  He is not taking Pepcid at night as suggested.  He does have Carafate at home but has not been taking it.    Current Outpatient Medications  Medication Sig Dispense Refill  . albuterol (VENTOLIN HFA) 108 (90 Base) MCG/ACT inhaler 2 PUFF(S) INHALATION EVERY FOUR HOURS AS NEEDED    . atorvastatin (LIPITOR) 20 MG tablet Take 20 mg by mouth daily.    Marland Kitchen BREO ELLIPTA 100-25 MCG/INH AEPB 1 PUFF(S) INHALATION EVERY DAY    . CHANTIX CONTINUING MONTH PAK 1 MG tablet See admin instructions.  2  . DULoxetine (CYMBALTA) 30 MG capsule Take 30 mg by mouth daily.    . hydrOXYzine (ATARAX/VISTARIL) 25 MG tablet     . lisinopril (ZESTRIL) 10 MG tablet Take 10 mg by mouth daily.    . metoprolol succinate (TOPROL-XL) 25 MG 24 hr tablet     . omeprazole (PRILOSEC) 40 MG capsule TAKE 1 CAPSULE BY MOUTH TWICE A DAY 60 capsule  5  . ondansetron (ZOFRAN) 8 MG tablet Take 8 mg by mouth every 8 (eight) hours as needed.    . potassium chloride SA (K-DUR) 20 MEQ tablet Take 1 tablet (20 mEq total) by mouth 2 (two) times daily. 28 tablet 0  . zolpidem (AMBIEN) 5 MG tablet Take 5 mg by mouth at bedtime as needed.     No current facility-administered medications for this visit.     Allergies as of 07/03/2019  . (No Known Allergies)    Review of Systems:    All systems reviewed and negative except where noted in HPI.   Observations/Objective:  Labs:  CMP     Component Value Date/Time   NA 138 05/30/2019 1234   K 2.9 (L) 05/30/2019 1234   CL 104 05/30/2019 1234   CO2 23 05/30/2019 1234   GLUCOSE 128 (H) 05/30/2019 1234   BUN 16 05/30/2019 1234   CREATININE 0.92 05/30/2019 1234   CALCIUM 9.1 05/30/2019 1234   PROT 8.0 05/30/2019 1234   ALBUMIN 4.1 05/30/2019 1234   AST 60 (H) 05/30/2019 1234   ALT 34 05/30/2019 1234   ALKPHOS 112 05/30/2019 1234   BILITOT 0.6 05/30/2019 1234   GFRNONAA >60 05/30/2019 1234   GFRAA >60 05/30/2019 1234   Lab Results  Component Value Date   WBC 9.7 05/28/2019   HGB 13.9 05/28/2019   HCT 42.4 05/28/2019   MCV 85.0 05/28/2019   PLT 221 05/28/2019    Imaging Studies: No results found.  Assessment and Plan:   Justin RalphsRobert Powers is a 28 y.o. y/o male here to follow up GERD with severe esophagitis.  Iron deficiency anemia likely secondary to severe acid reflux and esophagitis.  Esophageal stricture that has been dilated to 15 mm in May 2020.  Further dilated to 18 mm in July 2020.    Plan  1.Increase dose of omeprazole to 40 mg twice daily.  I suggested he can purchase the omeprazole over-the-counter.  I have also suggested him to add Pepcid at night.  I also advised him to take the sucralfate 4 times a day.  2.  I advised him to contact [facility] to schedule his follow-up appointment to have his hiatal hernia repaired.  3.  I will have my office contact him to see how he is doing on Friday if not better I will add Reglan to improve gastric emptying.  4.  Sick note to be provided from yesterday till Monday Follow-up in 7 to 10 days virtual visit.  Total time spent for the telephone call for discussion  With the patient is 14 mins    I discussed the assessment and treatment plan with the patient. The patient was provided an opportunity to ask questions and all were answered. The patient agreed with the plan and demonstrated an understanding of the instructions.   The patient was advised to call  back or seek an in-person evaluation if the symptoms worsen or if the condition fails to improve as anticipated.    Dr Wyline MoodKiran Earlyn Sylvan MD,MRCP [facility] Gastroenterology/Hepatology Pager: [phone]   Speech recognition software was used to dictate this note.

## 2019-07-10 ENCOUNTER — Other Ambulatory Visit: Payer: Self-pay

## 2019-07-10 ENCOUNTER — Ambulatory Visit (INDEPENDENT_AMBULATORY_CARE_PROVIDER_SITE_OTHER): Payer: 59 | Admitting: Gastroenterology

## 2019-07-10 DIAGNOSIS — K2100 Gastro-esophageal reflux disease with esophagitis, without bleeding: Secondary | ICD-10-CM

## 2019-07-10 NOTE — Progress Notes (Signed)
[name] , MD [address]  [address]  [place], [place] [address]  Main: [phone]  Fax: [phone]   Primary Care Physician: Franciso Bend, NP  Virtual Visit via Video Note  I connected with patient on 07/10/19 at  3:15 PM EDT by video and verified that I am speaking with the correct person using two identifiers.   I discussed the limitations, risks, security and privacy concerns of performing an evaluation and management service by video  and the availability of in person appointments. I also discussed with the patient that there may be a patient responsible charge related to this service. The patient expressed understanding and agreed to proceed.  Location of Patient: Home Location of Provider: Home Persons involved: Patient and provider only   History of Present Illness: No chief complaint on file.   HPI: Justin Powers is a 28 y.o. male  Summary of history :  Referred by Dr Cathie Hoops in Hematology foriron deficiencyanemiaand seen on 10/30/2018.. 2 years back Hb 13 grams with MCV 72.Rectal bleeding:few times when he wiped, sometimes a few spots and at one time a clot.Used to take a lot of [employer], stopped last year. Took it daily for a few years. Some epigastric pain, usually on and off , acid reflux in the past . Diarrhea for a year, has 3-4 bowel movements a day , semi solid- some blood.   10/30/2018: Urine no blood, adno virus in stool PCR positive , Fecal calprotectin 79 (borderline), C diff negative. B12 and folate normal . H pylori breath test negative,TTG Ab-positive   11/08/2018 : EGD+duodena, bx -normal duodenal villous pattern.Severe esophagitis lower end of the esophagus- commenced on PPI BID Colonoscopy : Normal TI, colon bx- normal. Rectum bx also normal . 03/06/2019: EGD- GE junction : stricture dilated to 15 mm .Grade A esophagitis seen and a large hiatal hernia.   03/26/2019 : Esophageal motility study at [facility]: esophageal dysmotility  noted 05/01/2019: EGD: GE junction stricture dilated to 18 mm  Diarrhea better after course of xifaxan in 12/2018.  Interval history 07/03/2019-07/10/2019   He says that he is feeling better than last time but not completely better.  Still having significant issues with reflux.  Has added Pepcid and sucralfate to his regimen.  At his last visit I suggested he increase his omeprazole to twice a day but unfortunately he has not been taking the night dose that he says it was not covered by his insurance I repeated as a had the last time that he should buy it over-the-counter and take it  Current Outpatient Medications  Medication Sig Dispense Refill  . albuterol (VENTOLIN HFA) 108 (90 Base) MCG/ACT inhaler 2 PUFF(S) INHALATION EVERY FOUR HOURS AS NEEDED    . atorvastatin (LIPITOR) 20 MG tablet Take 20 mg by mouth daily.    Marland Kitchen BREO ELLIPTA 100-25 MCG/INH AEPB 1 PUFF(S) INHALATION EVERY DAY    . CHANTIX CONTINUING MONTH PAK 1 MG tablet See admin instructions.  2  . DULoxetine (CYMBALTA) 30 MG capsule Take 30 mg by mouth daily.    . hydrOXYzine (ATARAX/VISTARIL) 25 MG tablet     . lisinopril (ZESTRIL) 10 MG tablet Take 10 mg by mouth daily.    . metoprolol succinate (TOPROL-XL) 25 MG 24 hr tablet     . omeprazole (PRILOSEC) 40 MG capsule TAKE 1 CAPSULE BY MOUTH TWICE A DAY 60 capsule 5  . ondansetron (ZOFRAN) 8 MG tablet Take 8 mg by mouth every 8 (  eight) hours as needed.    . potassium chloride SA (K-DUR) 20 MEQ tablet Take 1 tablet (20 mEq total) by mouth 2 (two) times daily. 28 tablet 0  . sucralfate (CARAFATE) 1 GM/10ML suspension Take 10 mLs (1 g total) by mouth 4 (four) times daily. 1200 mL 2  . zolpidem (AMBIEN) 5 MG tablet Take 5 mg by mouth at bedtime as needed.     No current facility-administered medications for this visit.     Allergies as of 07/10/2019  . (No Known Allergies)    Review of Systems:    All systems reviewed and negative except where noted in HPI.  General  Appearance:    Alert, cooperative, no distress, appears stated age  Head:    Normocephalic, without obvious abnormality, atraumatic  Eyes:    PERRL, conjunctiva/corneas clear,  Ears:    Grossly normal hearing    Neurologic:  Grossly normal    Observations/Objective:  Labs: CMP     Component Value Date/Time   NA 138 05/30/2019 1234   K 2.9 (L) 05/30/2019 1234   CL 104 05/30/2019 1234   CO2 23 05/30/2019 1234   GLUCOSE 128 (H) 05/30/2019 1234   BUN 16 05/30/2019 1234   CREATININE 0.92 05/30/2019 1234   CALCIUM 9.1 05/30/2019 1234   PROT 8.0 05/30/2019 1234   ALBUMIN 4.1 05/30/2019 1234   AST 60 (H) 05/30/2019 1234   ALT 34 05/30/2019 1234   ALKPHOS 112 05/30/2019 1234   BILITOT 0.6 05/30/2019 1234   GFRNONAA >60 05/30/2019 1234   GFRAA >60 05/30/2019 1234   Lab Results  Component Value Date   WBC 9.7 05/28/2019   HGB 13.9 05/28/2019   HCT 42.4 05/28/2019   MCV 85.0 05/28/2019   PLT 221 05/28/2019    Imaging Studies: No results found.  Assessment and Plan:   Justin Powers is a 28 y.o. y/o male here to follow up GERD with severe esophagitis.  Iron deficiency anemia likely secondary to severe acid reflux and esophagitis.  Esophageal stricture that has been dilated to 15 mm in May 2020.  Further dilated to 18 mm in July 2020.     Plan  1.Increase dose of omeprazole to 40 mg twice daily.  I suggested he can purchase the omeprazole over-the-counter.  Continue Pepcid at night and Carafate.2.  I advised him to contact [facility] to schedule his follow-up appointment to have his hiatal hernia repaired.  2.  Not able to get in touch with [facility] to follow-up for surgery to repair hiatal hernia.  I will asked my staff office staff to see if they can call [facility] and speed up the process  3.  If no better in 2 weeks we will add Reglan     I discussed the assessment and treatment plan with the patient. The patient was provided an opportunity to ask questions and all were answered. The  patient agreed with the plan and demonstrated an understanding of the instructions.   The patient was advised to call back or seek an in-person evaluation if the symptoms worsen or if the condition fails to improve as anticipated.   Dr Jonathon Bellows MD,MRCP [facility]) Gastroenterology/Hepatology Pager: [phone]   Speech recognition software was used to dictate this note.

## 2019-07-24 ENCOUNTER — Ambulatory Visit: Payer: 59 | Admitting: Gastroenterology

## 2019-08-02 ENCOUNTER — Other Ambulatory Visit: Payer: Self-pay

## 2019-08-02 MED ORDER — SUCRALFATE 1 G PO TABS: 1.0000 g | ORAL_TABLET | Freq: Three times a day (TID) | ORAL | 2 refills | Status: DC

## 2019-09-10 ENCOUNTER — Ambulatory Visit (INDEPENDENT_AMBULATORY_CARE_PROVIDER_SITE_OTHER): Payer: 59 | Admitting: Gastroenterology

## 2019-09-10 ENCOUNTER — Other Ambulatory Visit: Payer: Self-pay

## 2019-09-10 DIAGNOSIS — K2100 Gastro-esophageal reflux disease with esophagitis, without bleeding: Secondary | ICD-10-CM | POA: Diagnosis not present

## 2019-09-10 DIAGNOSIS — R1310 Dysphagia, unspecified: Secondary | ICD-10-CM

## 2019-09-10 NOTE — Progress Notes (Signed)
[name] , MD [address]  [address]  [place], [place] [address]  Main: [phone]  Fax: [phone]   Primary Care Physician: Franciso Bend, NP  Virtual Visit via Video Note  I connected with patient on 09/10/19 at  3:00 PM EST by video and verified that I am speaking with the correct person using two identifiers.   I discussed the limitations, risks, security and privacy concerns of performing an evaluation and management service by video  and the availability of in person appointments. I also discussed with the patient that there may be a patient responsible charge related to this service. The patient expressed understanding and agreed to proceed.  Location of Patient: Home Location of Provider: Home Persons involved: Patient and provider only   History of Present Illness: No chief complaint on file.   HPI: Justin Powers is a 28 y.o. male   Summary of history :  Referred by Dr Cathie Hoops in Hematology foriron deficiencyanemiaand seen on 10/30/2018.. 2 years back Hb 13 grams with MCV 72.Rectal bleeding:few times when he wiped, sometimes a few spots and at one time a clot.Used to take a lot of [employer], stopped last year. Took it daily for a few years. Some epigastric pain, usually on and off , acid reflux in the past . Diarrhea for a year, has 3-4 bowel movements a day , semi solid- some blood.   10/30/2018: Urine no blood, adno virus in stool PCR positive , Fecal calprotectin 79 (borderline), C diff negative. B12 and folate normal . H pylori breath test negative,TTG Ab-positive   11/08/2018 : EGD+duodena, bx -normal duodenal villous pattern.Severe esophagitis lower end of the esophagus- commenced on PPI BID Colonoscopy : Normal TI, colon bx- normal. Rectum bx also normal . 03/06/2019: EGD- GE junction : stricture dilated to 15 mm .Grade A esophagitis seen and a large hiatal hernia.   03/26/2019 : Esophageal motility study at [facility]: esophageal dysmotility  noted 05/01/2019: EGD: GE junction stricture dilated to 18 mm  Diarrhea better after course of xifaxan in 12/2018.  Interval history 07/10/2019 -09/10/2019  Since his last visit he has been able to take his Prilosec twice a day and along with with Pepcid at night.  With this regimen he has had absolutely no reflux symptoms.  He notices symptoms significantly if he misses any of these medications or doses.  Despite our attempts he has not heard back from [facility] since his last visit.  He also states that his dysphagia is gradually getting worse and thinks he needs to be stretched again. Current Outpatient Medications  Medication Sig Dispense Refill  . albuterol (VENTOLIN HFA) 108 (90 Base) MCG/ACT inhaler 2 PUFF(S) INHALATION EVERY FOUR HOURS AS NEEDED    . atorvastatin (LIPITOR) 20 MG tablet Take 20 mg by mouth daily.    Marland Kitchen BREO ELLIPTA 100-25 MCG/INH AEPB 1 PUFF(S) INHALATION EVERY DAY    . CHANTIX CONTINUING MONTH PAK 1 MG tablet See admin instructions.  2  . DULoxetine (CYMBALTA) 30 MG capsule Take 30 mg by mouth daily.    . hydrOXYzine (ATARAX/VISTARIL) 25 MG tablet     . lisinopril (ZESTRIL) 10 MG tablet Take 10 mg by mouth daily.    . metoprolol succinate (TOPROL-XL) 25 MG 24 hr tablet     . omeprazole (PRILOSEC) 40 MG capsule TAKE 1 CAPSULE BY MOUTH TWICE A DAY 60 capsule 5  . ondansetron (ZOFRAN) 8 MG tablet Take 8 mg by mouth every 8 (eight)  hours as needed.    . potassium chloride SA (K-DUR) 20 MEQ tablet Take 1 tablet (20 mEq total) by mouth 2 (two) times daily. 28 tablet 0  . sucralfate (CARAFATE) 1 g tablet Take 1 tablet (1 g total) by mouth 4 (four) times daily -  with meals and at bedtime. 120 tablet 2  . zolpidem (AMBIEN) 5 MG tablet Take 5 mg by mouth at bedtime as needed.     No current facility-administered medications for this visit.     Allergies as of 09/10/2019  . (No Known Allergies)    Review of Systems:    All systems reviewed and negative except where noted  in HPI.  General Appearance:    Alert, cooperative, no distress, appears stated age  Head:    Normocephalic, without obvious abnormality, atraumatic  Eyes:    PERRL, conjunctiva/corneas clear,  Ears:    Grossly normal hearing    Neurologic:  Grossly normal    Observations/Objective:  Labs: CMP     Component Value Date/Time   NA 138 05/30/2019 1234   K 2.9 (L) 05/30/2019 1234   CL 104 05/30/2019 1234   CO2 23 05/30/2019 1234   GLUCOSE 128 (H) 05/30/2019 1234   BUN 16 05/30/2019 1234   CREATININE 0.92 05/30/2019 1234   CALCIUM 9.1 05/30/2019 1234   PROT 8.0 05/30/2019 1234   ALBUMIN 4.1 05/30/2019 1234   AST 60 (H) 05/30/2019 1234   ALT 34 05/30/2019 1234   ALKPHOS 112 05/30/2019 1234   BILITOT 0.6 05/30/2019 1234   GFRNONAA >60 05/30/2019 1234   GFRAA >60 05/30/2019 1234   Lab Results  Component Value Date   WBC 9.7 05/28/2019   HGB 13.9 05/28/2019   HCT 42.4 05/28/2019   MCV 85.0 05/28/2019   PLT 221 05/28/2019    Imaging Studies: No results found.  Assessment and Plan:   Justin Powers is a 28 y.o. y/o malehere to follow upGERD with severe esophagitis. Iron deficiency anemia likely secondary to severe acid reflux and esophagitis. Esophageal stricture that has been dilated to 15 mm in May 2020. Further dilated to 18 mm in July 2020.   Symptoms of dysphagia have returned.  Symptoms of acid reflux completely resolve on taking Prilosec twice a day and Pepcid at night, he has not heard back from [facility] so far.    Plan  1.continue taking Prilosec and Pepcid combination. 2.  We will reattempt to contact [facility] and get him an answer regarding his appointment over the next few days. 3.  Since he is having progressive dysphagia he may need to be stretched again and we will set him up for an EGD.  The visit was initially video visit but could not be done so as he had a poor connection had to be converted to a phone visit   I have discussed alternative options, risks &  benefits,  which include, but are not limited to, bleeding, infection, perforation,respiratory complication & drug reaction.  The patient agrees with this plan & written consent will be obtained.      I discussed the assessment and treatment plan with the patient. The patient was provided an opportunity to ask questions and all were answered. The patient agreed with the plan and demonstrated an understanding of the instructions.   The patient was advised to call back or seek an in-person evaluation if the symptoms worsen or if the condition fails to improve as anticipated.    Dr Jonathon Bellows MD,MRCP [facility])  Gastroenterology/Hepatology Pager: [phone]   Speech recognition software was used to dictate this note.

## 2019-09-10 NOTE — Telephone Encounter (Signed)
Can you please send him the link

## 2019-09-12 ENCOUNTER — Telehealth: Payer: Self-pay

## 2019-09-12 NOTE — Telephone Encounter (Signed)
Can you please inform the patient?

## 2019-09-12 NOTE — Telephone Encounter (Signed)
Called pt to inform him that I contacted [facility]  regarding pt follow up visit for the hiatal hernia. I was told that after pt consultation visit, a follow up appointment was not scheduled. The receptionist states if pt would like to schedule a follow up visit he would need to contact their office.  Unable to contact, LVM to return call Will send MyChart message

## 2019-09-18 ENCOUNTER — Other Ambulatory Visit
Admission: RE | Admit: 2019-09-18 | Discharge: 2019-09-18 | Disposition: A | Discharge status: Home / Self Care | Payer: 59 | Source: Ambulatory Visit | Attending: Gastroenterology | Admitting: Gastroenterology

## 2019-09-18 ENCOUNTER — Other Ambulatory Visit: Payer: Self-pay

## 2019-09-18 DIAGNOSIS — R1310 Dysphagia, unspecified: Secondary | ICD-10-CM

## 2019-09-18 DIAGNOSIS — Z20828 Contact with and (suspected) exposure to other viral communicable diseases: Secondary | ICD-10-CM | POA: Diagnosis not present

## 2019-09-18 DIAGNOSIS — Z01812 Encounter for preprocedural laboratory examination: Secondary | ICD-10-CM | POA: Insufficient documentation

## 2019-09-18 NOTE — Telephone Encounter (Signed)
Called pt again to regarding his [facility] follow up  Unable to contact, LVM to return call or to review MyChart message

## 2019-09-18 NOTE — Telephone Encounter (Signed)
Spoke with pt and informed him that he would need to call [facility] to schedule his follow up appointment. Pt understands and agrees. Pt would also like to proceed with scheduling the repeat EGD with dilation once he confirms time off with his employer. Pt plans to call back to schedule procedure.

## 2019-09-19 LAB — SARS CORONAVIRUS 2 (TAT 6-24 HRS): SARS Coronavirus 2: NEGATIVE

## 2019-09-21 ENCOUNTER — Ambulatory Visit: Payer: 59 | Admitting: Anesthesiology

## 2019-09-21 ENCOUNTER — Ambulatory Visit
Admission: RE | Admit: 2019-09-21 | Discharge: 2019-09-21 | Disposition: A | Discharge status: Home / Self Care | Payer: 59 | Attending: Gastroenterology | Admitting: Gastroenterology

## 2019-09-21 ENCOUNTER — Encounter
Admission: RE | Disposition: A | Discharge status: Home / Self Care | Payer: Self-pay | Source: Home / Self Care | Attending: Gastroenterology

## 2019-09-21 ENCOUNTER — Encounter: Payer: Self-pay | Admitting: *Deleted

## 2019-09-21 ENCOUNTER — Other Ambulatory Visit: Payer: Self-pay

## 2019-09-21 DIAGNOSIS — F17210 Nicotine dependence, cigarettes, uncomplicated: Secondary | ICD-10-CM | POA: Diagnosis not present

## 2019-09-21 DIAGNOSIS — I10 Essential (primary) hypertension: Secondary | ICD-10-CM | POA: Insufficient documentation

## 2019-09-21 DIAGNOSIS — K222 Esophageal obstruction: Secondary | ICD-10-CM | POA: Diagnosis not present

## 2019-09-21 DIAGNOSIS — K449 Diaphragmatic hernia without obstruction or gangrene: Secondary | ICD-10-CM | POA: Diagnosis not present

## 2019-09-21 DIAGNOSIS — Z79899 Other long term (current) drug therapy: Secondary | ICD-10-CM | POA: Insufficient documentation

## 2019-09-21 DIAGNOSIS — R1310 Dysphagia, unspecified: Secondary | ICD-10-CM | POA: Diagnosis not present

## 2019-09-21 DIAGNOSIS — Z7951 Long term (current) use of inhaled steroids: Secondary | ICD-10-CM | POA: Insufficient documentation

## 2019-09-21 HISTORY — PX: ESOPHAGOGASTRODUODENOSCOPY (EGD) WITH PROPOFOL: SHX5813

## 2019-09-21 SURGERY — ESOPHAGOGASTRODUODENOSCOPY (EGD) WITH PROPOFOL
Anesthesia: General

## 2019-09-21 MED ORDER — LIDOCAINE HCL (PF) 2 % IJ SOLN
INTRAMUSCULAR | Status: AC
  Filled 2019-09-21: qty 10

## 2019-09-21 MED ORDER — SODIUM CHLORIDE 0.9 % IV SOLN
INTRAVENOUS | Status: DC
  Administered 2019-09-21: 1000 mL via INTRAVENOUS

## 2019-09-21 MED ORDER — FENTANYL CITRATE (PF) 100 MCG/2ML IJ SOLN
INTRAMUSCULAR | Status: AC
  Filled 2019-09-21: qty 2

## 2019-09-21 MED ORDER — FENTANYL CITRATE (PF) 100 MCG/2ML IJ SOLN
INTRAMUSCULAR | Status: DC | PRN
  Administered 2019-09-21 (×2): 50 ug via INTRAVENOUS

## 2019-09-21 MED ORDER — PROPOFOL 10 MG/ML IV BOLUS
INTRAVENOUS | Status: DC | PRN
  Administered 2019-09-21: 30 mg via INTRAVENOUS
  Administered 2019-09-21: 100 mg via INTRAVENOUS
  Administered 2019-09-21: 50 mg via INTRAVENOUS

## 2019-09-21 NOTE — Anesthesia Post-op Follow-up Note (Signed)
Anesthesia QCDR form completed.        

## 2019-09-21 NOTE — Anesthesia Preprocedure Evaluation (Signed)
Anesthesia Evaluation  Patient identified by MRN, date of birth, ID band Patient awake    Reviewed: Allergy & Precautions, NPO status , Patient's Chart, lab work & pertinent test results  History of Anesthesia Complications Negative for: history of anesthetic complications  Airway Mallampati: II  TM Distance: >3 FB Neck ROM: Full    Dental no notable dental hx.    Pulmonary neg COPD, Current Smoker and Patient abstained from smoking.,    breath sounds clear to auscultation- rhonchi (-) wheezing      Cardiovascular Exercise Tolerance: Good hypertension, (-) CAD, (-) Past MI, (-) Cardiac Stents and (-) CABG  Rhythm:Regular Rate:Normal - Systolic murmurs and - Diastolic murmurs    Neuro/Psych negative neurological ROS  negative psych ROS   GI/Hepatic Neg liver ROS, PUD,   Endo/Other  negative endocrine ROSneg diabetes  Renal/GU negative Renal ROS     Musculoskeletal negative musculoskeletal ROS (+)   Abdominal (+) + obese,   Peds  Hematology  (+) Blood dyscrasia, anemia ,   Anesthesia Other Findings Past Medical History: No date: Anemia No date: Hypertension   Reproductive/Obstetrics                             Anesthesia Physical  Anesthesia Plan  ASA: III  Anesthesia Plan: General   Post-op Pain Management:    Induction: Intravenous  PONV Risk Score and Plan: 0 and Propofol infusion  Airway Management Planned: Natural Airway and Nasal Cannula  Additional Equipment:   Intra-op Plan:   Post-operative Plan:   Informed Consent: I have reviewed the patients History and Physical, chart, labs and discussed the procedure including the risks, benefits and alternatives for the proposed anesthesia with the patient or authorized representative who has indicated his/her understanding and acceptance.     Dental Advisory Given  Plan Discussed with: Anesthesiologist, CRNA and  Surgeon  Anesthesia Plan Comments: (Patient consented for risks of anesthesia including but not limited to:  - adverse reactions to medications - risk of intubation if required - damage to teeth, lips or other oral mucosa - sore throat or hoarseness - Damage to heart, brain, lungs or loss of life  Patient voiced understanding.)        Anesthesia Quick Evaluation

## 2019-09-21 NOTE — Transfer of Care (Signed)
Immediate Anesthesia Transfer of Care Note  Patient: Justin Powers  Procedure(s) Performed: ESOPHAGOGASTRODUODENOSCOPY (EGD) WITH PROPOFOL with Dilation (N/A )  Patient Location: PACU and Endoscopy Unit  Anesthesia Type:General  Level of Consciousness: awake, alert , oriented and patient cooperative  Airway & Oxygen Therapy: Patient Spontanous Breathing  Post-op Assessment: Report given to RN, Post -op Vital signs reviewed and stable and Patient moving all extremities  Post vital signs: Reviewed and stable  Last Vitals:  Vitals Value Taken Time  BP 132/77 09/21/19 1207  Temp    Pulse 83 09/21/19 1208  Resp 23 09/21/19 1208  SpO2 98 % 09/21/19 1208  Vitals shown include unvalidated device data.  Last Pain:  Vitals:   09/21/19 1205  TempSrc: Temporal  PainSc: 0-No pain         Complications: No apparent anesthesia complications

## 2019-09-21 NOTE — H&P (Signed)
[name], MD [address], [address], [place], [place], [address] [address], [address], [place], [place], [address] Phone: [phone]  Fax: [phone]  Primary Care Physician:  Franciso Bend, NP   Pre-Procedure History & Physical: HPI:  Justin Powers is a 28 y.o. male is here for an endoscopy    Past Medical History:  Diagnosis Date  . Anemia   . Hypertension     Past Surgical History:  Procedure Laterality Date  . COLONOSCOPY WITH PROPOFOL N/A 11/08/2018   Procedure: COLONOSCOPY WITH PROPOFOL;  Surgeon: [name], MD;  Location: [facility] ENDOSCOPY;  Service: Gastroenterology;  Laterality: N/A;  . ESOPHAGOGASTRODUODENOSCOPY (EGD) WITH PROPOFOL N/A 11/08/2018   Procedure: ESOPHAGOGASTRODUODENOSCOPY (EGD) WITH PROPOFOL;  Surgeon: [name], MD;  Location: [facility] ENDOSCOPY;  Service: Gastroenterology;  Laterality: N/A;  . ESOPHAGOGASTRODUODENOSCOPY (EGD) WITH PROPOFOL N/A 03/06/2019   Procedure: ESOPHAGOGASTRODUODENOSCOPY (EGD) WITH PROPOFOL with Dilation;  Surgeon: [name], MD;  Location: [facility] ENDOSCOPY;  Service: Gastroenterology;  Laterality: N/A;  . ESOPHAGOGASTRODUODENOSCOPY (EGD) WITH PROPOFOL N/A 05/01/2019   Procedure: ESOPHAGOGASTRODUODENOSCOPY (EGD) WITH PROPOFOL with Dilation;  Surgeon: [name], MD;  Location: [facility] ENDOSCOPY;  Service: Gastroenterology;  Laterality: N/A;  . wisdon tooth removal      Prior to Admission medications   Medication Sig Start Date End Date Taking? Authorizing Provider  atorvastatin (LIPITOR) 20 MG tablet Take 20 mg by mouth daily. 04/24/19  Yes [provider]  BREO ELLIPTA 100-25 MCG/INH AEPB 1 PUFF(S) INHALATION EVERY DAY 02/19/19  Yes [provider]  lisinopril (ZESTRIL) 10 MG tablet Take 10 mg by mouth daily. 03/22/19  Yes [provider]  omeprazole (PRILOSEC) 40 MG capsule TAKE 1 CAPSULE BY MOUTH TWICE A DAY 05/21/19  Yes [name], MD  potassium chloride SA (K-DUR) 20 MEQ tablet Take 1 tablet  (20 mEq total) by mouth 2 (two) times daily. 05/30/19  Yes Rickard Patience, MD  albuterol (VENTOLIN HFA) 108 (90 Base) MCG/ACT inhaler 2 PUFF(S) INHALATION EVERY FOUR HOURS AS NEEDED 02/19/19   [provider]  CHANTIX CONTINUING MONTH PAK 1 MG tablet See admin instructions. 09/12/18   [provider]  DULoxetine (CYMBALTA) 30 MG capsule Take 30 mg by mouth daily.    [provider]  hydrOXYzine (ATARAX/VISTARIL) 25 MG tablet  11/02/18   [provider]  metoprolol succinate (TOPROL-XL) 25 MG 24 hr tablet  04/18/19   [provider]  ondansetron (ZOFRAN) 8 MG tablet Take 8 mg by mouth every 8 (eight) hours as needed. 04/24/19   [provider]  sucralfate (CARAFATE) 1 g tablet Take 1 tablet (1 g total) by mouth 4 (four) times daily -  with meals and at bedtime. Patient not taking: Reported on 09/21/2019 08/02/19 10/31/19  [name], MD  zolpidem (AMBIEN) 5 MG tablet Take 5 mg by mouth at bedtime as needed. 04/02/19   [provider]    Allergies as of 09/18/2019  . (No Known Allergies)    Family History  Problem Relation Age of Onset  . Crohn's disease Mother   . Hypertension Mother   . Breast cancer Maternal Aunt   . Bladder Cancer Maternal Grandfather   . Cervical cancer Other   . Non-Hodgkin's lymphoma Other     Social History   Socioeconomic History  . Marital status: Married    Spouse name: Not on file  . Number of children: Not on file  . Years of education: Not on file  . Highest  education level: Not on file  Occupational History  . Not on file  Social Needs  . Financial resource strain: Not on file  . Food insecurity    Worry: Not on file    Inability: Not on file  . Transportation needs    Medical: Not on file    Non-medical: Not on file  Tobacco Use  . Smoking status: Current Every Day Smoker    Packs/day: 1.00    Types: Cigarettes  . Smokeless tobacco: Never Used  Substance and Sexual Activity  . Alcohol use:  Not Currently    Comment: Occassionally   . Drug use: Never  . Sexual activity: Not on file  Lifestyle  . Physical activity    Days per week: Not on file    Minutes per session: Not on file  . Stress: Not on file  Relationships  . Social Herbalist on phone: Not on file    Gets together: Not on file    Attends religious service: Not on file    Active member of club or organization: Not on file    Attends meetings of clubs or organizations: Not on file    Relationship status: Not on file  . Intimate partner violence    Fear of current or ex partner: Not on file    Emotionally abused: Not on file    Physically abused: Not on file    Forced sexual activity: Not on file  Other Topics Concern  . Not on file  Social History Narrative  . Not on file    Review of Systems: See HPI, otherwise negative ROS  Physical Exam: BP (!) 137/95   Pulse 85   Temp (!) 97.1 F (36.2 C) (Temporal)   Resp 18   Ht 5\' 8"  (1.727 m)   Wt 106.6 kg   SpO2 100%   BMI 35.73 kg/m  General:   Alert,  pleasant and cooperative in NAD Head:  Normocephalic and atraumatic. Neck:  Supple; no masses or thyromegaly. Lungs:  Clear throughout to auscultation, normal respiratory effort.    Heart:  +S1, +S2, Regular rate and rhythm, No edema. Abdomen:  Soft, nontender and nondistended. Normal bowel sounds, without guarding, and without rebound.   Neurologic:  Alert and  oriented x4;  grossly normal neurologically.  Impression/Plan: Justin Powers is here for an endoscopy  to be performed for  evaluation of dysphagia    Risks, benefits, limitations, and alternatives regarding endoscopy have been reviewed with the patient.  Questions have been answered.  All parties agreeable.   Jonathon Bellows, MD  09/21/2019, 11:48 AM

## 2019-09-21 NOTE — Anesthesia Postprocedure Evaluation (Signed)
Anesthesia Post Note  Patient: Justin Powers  Procedure(s) Performed: ESOPHAGOGASTRODUODENOSCOPY (EGD) WITH PROPOFOL with Dilation (N/A )  Patient location during evaluation: Endoscopy Anesthesia Type: General Level of consciousness: awake and alert Pain management: pain level controlled Vital Signs Assessment: post-procedure vital signs reviewed and stable Respiratory status: spontaneous breathing, nonlabored ventilation, respiratory function stable and patient connected to nasal cannula oxygen Cardiovascular status: blood pressure returned to baseline and stable Postop Assessment: no apparent nausea or vomiting Anesthetic complications: no     Last Vitals:  Vitals:   09/21/19 1215 09/21/19 1225  BP: (!) 132/94 133/88  Pulse: 91 86  Resp: 17 (!) 22  Temp:    SpO2: 96% 99%    Last Pain:  Vitals:   09/21/19 1225  TempSrc:   PainSc: 0-No pain                 Precious Haws Nyelle Wolfson

## 2019-09-21 NOTE — Op Note (Signed)
[facility] Gastroenterology Patient Name: Justin Powers Procedure Date: 09/21/2019 11:55 AM MRN: [id number] Account #: [id number] Date of Birth: 1990/10/19 Admit Type: Outpatient Age: 28 Room: [facility] ENDO ROOM 3 Gender: Male Note Status: Finalized Procedure:             Upper GI endoscopy Indications:           Dysphagia Providers:             Jonathon Bellows MD, MD Referring MD:          Stephani Police. Stann Mainland (Referring MD) Medicines:             Monitored Anesthesia Care Complications:         No immediate complications. Procedure:             Pre-Anesthesia Assessment:                        - Prior to the procedure, a History and Physical was                         performed, and patient medications, allergies and                         sensitivities were reviewed. The patient's tolerance                         of previous anesthesia was reviewed.                        - The risks and benefits of the procedure and the                         sedation options and risks were discussed with the                         patient. All questions were answered and informed                         consent was obtained.                        - ASA Grade Assessment: II - A patient with mild                         systemic disease.                        After obtaining informed consent, the endoscope was                         passed under direct vision. Throughout the procedure,                         the patient's blood pressure, pulse, and oxygen                         saturations were monitored continuously. The Endoscope                         was introduced through the mouth, and advanced to  the                         third part of duodenum. The upper GI endoscopy was                         accomplished with ease. The patient tolerated the                         procedure well. Findings:      The examined duodenum was normal.      The cardia and gastric fundus  were normal on retroflexion.      A medium-sized hiatal hernia was present.      One benign-appearing, intrinsic moderate (circumferential scarring or       stenosis; an endoscope may pass) stenosis was found at the       gastroesophageal junction. This stenosis measured 1.5 cm (inner       diameter) x less than one cm (in length). The stenosis was traversed. A       TTS dilator was passed through the scope. Dilation with a 15-16.5-18 mm       balloon dilator was performed to 18 mm. The dilation site was examined       and showed complete resolution of luminal narrowing. Impression:            - Normal examined duodenum.                        - Medium-sized hiatal hernia.                        - Benign-appearing esophageal stenosis. Dilated.                        - No specimens collected. Recommendation:        - Discharge patient to home (with escort).                        - Resume previous diet.                        - Continue present medications.                        - Return to my office as previously scheduled. Procedure Code(s):     --- Professional ---                        [phone], Esophagogastroduodenoscopy, flexible,                         transoral; with transendoscopic balloon dilation of                         esophagus (less than 30 mm diameter) Diagnosis Code(s):     --- Professional ---                        K22.2, Esophageal obstruction                        K44.9, Diaphragmatic hernia without obstruction or  gangrene                        R13.10, Dysphagia, unspecified CPT copyright 2019 American Medical Association. All rights reserved. The codes documented in this report are preliminary and upon coder review may  be revised to meet current compliance requirements. [name], MD [name] MD, MD 09/21/2019 12:04:14 PM This report has been signed electronically. Number of Addenda: 0 Note Initiated On: 09/21/2019 11:55  AM Estimated Blood Loss:  Estimated blood loss: none.      [facility]

## 2019-09-24 ENCOUNTER — Other Ambulatory Visit: Payer: Self-pay | Admitting: Oncology

## 2019-09-24 ENCOUNTER — Inpatient Hospital Stay: Payer: 59 | Attending: Oncology

## 2019-09-24 ENCOUNTER — Encounter: Payer: Self-pay | Admitting: Gastroenterology

## 2019-09-24 ENCOUNTER — Other Ambulatory Visit: Payer: Self-pay

## 2019-09-24 DIAGNOSIS — K209 Esophagitis, unspecified without bleeding: Secondary | ICD-10-CM | POA: Diagnosis not present

## 2019-09-24 DIAGNOSIS — F17210 Nicotine dependence, cigarettes, uncomplicated: Secondary | ICD-10-CM | POA: Insufficient documentation

## 2019-09-24 DIAGNOSIS — D500 Iron deficiency anemia secondary to blood loss (chronic): Secondary | ICD-10-CM | POA: Insufficient documentation

## 2019-09-24 DIAGNOSIS — Z7951 Long term (current) use of inhaled steroids: Secondary | ICD-10-CM | POA: Diagnosis not present

## 2019-09-24 DIAGNOSIS — K922 Gastrointestinal hemorrhage, unspecified: Secondary | ICD-10-CM | POA: Insufficient documentation

## 2019-09-24 DIAGNOSIS — Z79899 Other long term (current) drug therapy: Secondary | ICD-10-CM | POA: Insufficient documentation

## 2019-09-24 DIAGNOSIS — R5383 Other fatigue: Secondary | ICD-10-CM | POA: Insufficient documentation

## 2019-09-24 LAB — CBC WITH DIFFERENTIAL/PLATELET
Abs Immature Granulocytes: 0.08 10*3/uL — ABNORMAL HIGH (ref 0.00–0.07)
Basophils Absolute: 0.1 10*3/uL (ref 0.0–0.1)
Basophils Relative: 1 %
Eosinophils Absolute: 0.1 10*3/uL (ref 0.0–0.5)
Eosinophils Relative: 1 %
HCT: 46.5 % (ref 39.0–52.0)
Hemoglobin: 14.7 g/dL (ref 13.0–17.0)
Immature Granulocytes: 1 %
Lymphocytes Relative: 19 %
Lymphs Abs: 2.3 10*3/uL (ref 0.7–4.0)
MCH: 28.5 pg (ref 26.0–34.0)
MCHC: 31.6 g/dL (ref 30.0–36.0)
MCV: 90.1 fL (ref 80.0–100.0)
Monocytes Absolute: 1 10*3/uL (ref 0.1–1.0)
Monocytes Relative: 8 %
Neutro Abs: 9 10*3/uL — ABNORMAL HIGH (ref 1.7–7.7)
Neutrophils Relative %: 70 %
Platelets: 281 10*3/uL (ref 150–400)
RBC: 5.16 MIL/uL (ref 4.22–5.81)
RDW: 13.3 % (ref 11.5–15.5)
WBC: 12.7 10*3/uL — ABNORMAL HIGH (ref 4.0–10.5)
nRBC: 0 % (ref 0.0–0.2)

## 2019-09-24 LAB — FERRITIN: Ferritin: 25 ng/mL (ref 24–336)

## 2019-09-24 LAB — IRON AND TIBC
Iron: 159 ug/dL (ref 45–182)
Saturation Ratios: 35 % (ref 17.9–39.5)
TIBC: 458 ug/dL — ABNORMAL HIGH (ref 250–450)
UIBC: 299 ug/dL

## 2019-09-26 ENCOUNTER — Inpatient Hospital Stay: Payer: 59

## 2019-09-26 ENCOUNTER — Inpatient Hospital Stay (HOSPITAL_BASED_OUTPATIENT_CLINIC_OR_DEPARTMENT_OTHER): Payer: 59 | Admitting: Oncology

## 2019-09-26 ENCOUNTER — Encounter: Payer: Self-pay | Admitting: Oncology

## 2019-09-26 DIAGNOSIS — K209 Esophagitis, unspecified without bleeding: Secondary | ICD-10-CM | POA: Diagnosis not present

## 2019-09-26 DIAGNOSIS — D500 Iron deficiency anemia secondary to blood loss (chronic): Secondary | ICD-10-CM

## 2019-09-26 NOTE — Progress Notes (Signed)
Patient reports bright red blood in the toilet all day.  Has been advised by his PCP to see urologist and is already established with a GI.

## 2019-09-26 NOTE — Progress Notes (Signed)
HEMATOLOGY-ONCOLOGY TeleHEALTH VISIT PROGRESS NOTE  I connected with Justin Powers on 09/26/19 at  9:30 AM EST by video enabled telemedicine visit and verified that I am speaking with the correct person using two identifiers. I discussed the limitations, risks, security and privacy concerns of performing an evaluation and management service by telemedicine and the availability of in-person appointments. I also discussed with the patient that there may be a patient responsible charge related to this service. The patient expressed understanding and agreed to proceed.   Other persons participating in the visit and their role in the encounter:  None  Patient's location: Home  Provider's location: office Chief Complaint: Iron deficiency anemia   INTERVAL HISTORY Justin Powers is a 28 y.o. male who has above history reviewed by me today presents for follow up visit for management of iron deficiency anemia Problems and complaints are listed below:  Patient received IV iron treatment since last visit. He reports fatigue is better. He continues to see blood in the toilet with bowel movement. He also reports that there is a cystic bump behind his " private part".  His PCP has referred him to establish care with urology for further evaluation. Denies any fever, chills, unintentional weight loss. Patient recently had EGD on 09/21/2019.  EGD showed benign appearing.  Esophageal stenosis.  Dilated. Review of Systems  Constitutional: Negative for appetite change, chills, fatigue, fever and unexpected weight change.  HENT:   Negative for hearing loss and voice change.   Eyes: Negative for eye problems and icterus.  Respiratory: Negative for chest tightness, cough and shortness of breath.   Cardiovascular: Negative for chest pain and leg swelling.  Gastrointestinal: Negative for abdominal distention and abdominal pain.       Blood in the toilet  Endocrine: Negative for hot flashes.  Genitourinary:  Negative for difficulty urinating, dysuria and frequency.        Bump behind "private part".   Musculoskeletal: Negative for arthralgias.  Skin: Negative for itching and rash.  Neurological: Negative for light-headedness and numbness.  Hematological: Negative for adenopathy. Does not bruise/bleed easily.  Psychiatric/Behavioral: Negative for confusion.    Past Medical History:  Diagnosis Date  . Anemia   . Hypertension    Past Surgical History:  Procedure Laterality Date  . COLONOSCOPY WITH PROPOFOL N/A 11/08/2018   Procedure: COLONOSCOPY WITH PROPOFOL;  Surgeon: Wyline MoodAnna, Kiran, MD;  [facility];  Service: Gastroenterology;  Laterality: N/A;  . ESOPHAGOGASTRODUODENOSCOPY (EGD) WITH PROPOFOL N/A 11/08/2018   Procedure: ESOPHAGOGASTRODUODENOSCOPY (EGD) WITH PROPOFOL;  Surgeon: Wyline MoodAnna, Kiran, MD;  [facility];  Service: Gastroenterology;  Laterality: N/A;  . ESOPHAGOGASTRODUODENOSCOPY (EGD) WITH PROPOFOL N/A 03/06/2019   Procedure: ESOPHAGOGASTRODUODENOSCOPY (EGD) WITH PROPOFOL with Dilation;  Surgeon: Wyline MoodAnna, Kiran, MD;  [facility];  Service: Gastroenterology;  Laterality: N/A;  . ESOPHAGOGASTRODUODENOSCOPY (EGD) WITH PROPOFOL N/A 05/01/2019   Procedure: ESOPHAGOGASTRODUODENOSCOPY (EGD) WITH PROPOFOL with Dilation;  Surgeon: Wyline MoodAnna, Kiran, MD;  [facility];  Service: Gastroenterology;  Laterality: N/A;  . ESOPHAGOGASTRODUODENOSCOPY (EGD) WITH PROPOFOL N/A 09/21/2019   Procedure: ESOPHAGOGASTRODUODENOSCOPY (EGD) WITH PROPOFOL with Dilation;  Surgeon: Wyline MoodAnna, Kiran, MD;  [facility];  Service: Gastroenterology;  Laterality: N/A;  . wisdon tooth removal      Family History  Problem Relation Age of Onset  . Crohn's disease Mother   . Hypertension Mother   . Breast cancer Maternal Aunt   . Bladder Cancer Maternal Grandfather   . Cervical cancer Other   . Non-Hodgkin's lymphoma Other  Social History   Socioeconomic History  . Marital  status: Married    Spouse name: Not on file  . Number of children: Not on file  . Years of education: Not on file  . Highest education level: Not on file  Occupational History  . Not on file  Social Needs  . Financial resource strain: Not on file  . Food insecurity    Worry: Not on file    Inability: Not on file  . Transportation needs    Medical: Not on file    Non-medical: Not on file  Tobacco Use  . Smoking status: Current Every Day Smoker    Packs/day: 1.00    Types: Cigarettes  . Smokeless tobacco: Never Used  Substance and Sexual Activity  . Alcohol use: Not Currently    Comment: Occassionally   . Drug use: Never  . Sexual activity: Not on file  Lifestyle  . Physical activity    Days per week: Not on file    Minutes per session: Not on file  . Stress: Not on file  Relationships  . Social Herbalist on phone: Not on file    Gets together: Not on file    Attends religious service: Not on file    Active member of club or organization: Not on file    Attends meetings of clubs or organizations: Not on file    Relationship status: Not on file  . Intimate partner violence    Fear of current or ex partner: Not on file    Emotionally abused: Not on file    Physically abused: Not on file    Forced sexual activity: Not on file  Other Topics Concern  . Not on file  Social History Narrative  . Not on file    Current Outpatient Medications on File Prior to Visit  Medication Sig Dispense Refill  . albuterol (VENTOLIN HFA) 108 (90 Base) MCG/ACT inhaler 2 PUFF(S) INHALATION EVERY FOUR HOURS AS NEEDED    . atorvastatin (LIPITOR) 20 MG tablet Take 20 mg by mouth daily.    Marland Kitchen buPROPion (WELLBUTRIN SR) 200 MG 12 hr tablet Take 200 mg by mouth 2 (two) times daily.    . D3-50 1.25 MG (50000 UT) capsule Take 50,000 Units by mouth once a week.    . hydrOXYzine (ATARAX/VISTARIL) 25 MG tablet every 4 (four) hours as needed.     Marland Kitchen lisinopril (ZESTRIL) 10 MG tablet Take 10 mg  by mouth daily.    Marland Kitchen LORazepam (ATIVAN) 1 MG tablet PLEASE SEE ATTACHED FOR DETAILED DIRECTIONS    . metoprolol succinate (TOPROL-XL) 25 MG 24 hr tablet     . omeprazole (PRILOSEC) 40 MG capsule TAKE 1 CAPSULE BY MOUTH TWICE A DAY 60 capsule 5  . VRAYLAR capsule Take 1.5 mg by mouth daily.    Marland Kitchen BREO ELLIPTA 100-25 MCG/INH AEPB 1 PUFF(S) INHALATION EVERY DAY    . CHANTIX CONTINUING MONTH PAK 1 MG tablet See admin instructions.  2   No current facility-administered medications on file prior to visit.     No Known Allergies     Observations/Objective: Today's Vitals   09/26/19 0916  PainSc: 0-No pain   There is no height or weight on file to calculate BMI.  Physical Exam  Constitutional: He is oriented to person, place, and time. No distress.  Neurological: He is alert and oriented to person, place, and time.  Psychiatric: Affect normal.    CBC  Component Value Date/Time   WBC 12.7 (H) 09/24/2019 1536   RBC 5.16 09/24/2019 1536   HGB 14.7 09/24/2019 1536   HCT 46.5 09/24/2019 1536   PLT 281 09/24/2019 1536   MCV 90.1 09/24/2019 1536   MCH 28.5 09/24/2019 1536   MCHC 31.6 09/24/2019 1536   RDW 13.3 09/24/2019 1536   LYMPHSABS 2.3 09/24/2019 1536   MONOABS 1.0 09/24/2019 1536   EOSABS 0.1 09/24/2019 1536   BASOSABS 0.1 09/24/2019 1536    CMP     Component Value Date/Time   NA 138 05/30/2019 1234   K 2.9 (L) 05/30/2019 1234   CL 104 05/30/2019 1234   CO2 23 05/30/2019 1234   GLUCOSE 128 (H) 05/30/2019 1234   BUN 16 05/30/2019 1234   CREATININE 0.92 05/30/2019 1234   CALCIUM 9.1 05/30/2019 1234   PROT 8.0 05/30/2019 1234   ALBUMIN 4.1 05/30/2019 1234   AST 60 (H) 05/30/2019 1234   ALT 34 05/30/2019 1234   ALKPHOS 112 05/30/2019 1234   BILITOT 0.6 05/30/2019 1234   GFRNONAA >60 05/30/2019 1234   GFRAA >60 05/30/2019 1234     Assessment and Plan: 1. Iron deficiency anemia due to chronic blood loss   2. Esophagitis     Labs are reviewed and discussed with  patient.  Hemoglobin normal at 14.7.  Iron panel shows good iron saturation at 35, ferritin 25.  No need for additional IV iron at this point.  Continue follow-up with gastroenterology. Establish care with urology per PCP advised.  Follow Up Instructions: 3 months   I discussed the assessment and treatment plan with the patient. The patient was provided an opportunity to ask questions and all were answered. The patient agreed with the plan and demonstrated an understanding of the instructions.  The patient was advised to call back or seek an in-person evaluation if the symptoms worsen or if the condition fails to improve as anticipated.   I provided 15 minutes of face-to-face video visit time during this encounter, and > 50% was spent counseling as documented under my assessment & plan.  Rickard Patience, MD 09/26/2019 4:41 PM

## 2019-11-28 ENCOUNTER — Telehealth: Payer: Self-pay | Admitting: Oncology

## 2019-11-28 ENCOUNTER — Telehealth: Payer: Self-pay | Admitting: *Deleted

## 2019-11-28 NOTE — Telephone Encounter (Signed)
Patient phoned stating that his PCP checked his labs and that his iron was a 29, when it should be in the 50s. He stated that he wondered if he needed to be seen or to get an infusion. [facility] to assist with this.

## 2019-11-28 NOTE — Telephone Encounter (Signed)
Labs from 11/22/19 is scanned in media

## 2019-11-28 NOTE — Telephone Encounter (Signed)
Please arrange patient to get Feraheme x 1 thanks. Follow up as planned.

## 2019-11-28 NOTE — Telephone Encounter (Signed)
patient phoned today saying that he went to his PCP who did lab work and his iron was 29. he said that he thought he may need to be seen for an infusion and possibly a lab to confirm.  Please advise

## 2019-11-29 NOTE — Telephone Encounter (Signed)
Schedule message sent. 

## 2019-12-06 ENCOUNTER — Other Ambulatory Visit: Payer: Self-pay

## 2019-12-06 ENCOUNTER — Inpatient Hospital Stay: Payer: 59 | Attending: Oncology

## 2019-12-06 VITALS — BP 138/81 | HR 97 | Temp 95.6°F | Resp 20

## 2019-12-06 DIAGNOSIS — K209 Esophagitis, unspecified without bleeding: Secondary | ICD-10-CM | POA: Insufficient documentation

## 2019-12-06 DIAGNOSIS — D500 Iron deficiency anemia secondary to blood loss (chronic): Secondary | ICD-10-CM

## 2019-12-06 DIAGNOSIS — D72829 Elevated white blood cell count, unspecified: Secondary | ICD-10-CM | POA: Insufficient documentation

## 2019-12-06 MED ORDER — SODIUM CHLORIDE 0.9 % IV SOLN
Freq: Once | INTRAVENOUS | Status: AC
  Filled 2019-12-06: qty 250

## 2019-12-06 MED ORDER — IRON SUCROSE 20 MG/ML IV SOLN
200.0000 mg | Freq: Once | INTRAVENOUS | Status: AC
  Administered 2019-12-06: 200 mg via INTRAVENOUS
  Filled 2019-12-06: qty 10

## 2019-12-06 MED ORDER — SODIUM CHLORIDE 0.9 % IV SOLN: 200.0000 mg | Freq: Once | INTRAVENOUS | Status: DC

## 2019-12-10 ENCOUNTER — Telehealth: Payer: Self-pay | Admitting: *Deleted

## 2019-12-10 NOTE — Telephone Encounter (Signed)
Patient is aware, Work note will be available at front desk.

## 2019-12-10 NOTE — Telephone Encounter (Signed)
Patient called requesting a note for the date of his recent infusion. Please call him back when ready

## 2019-12-11 ENCOUNTER — Ambulatory Visit: Payer: 59 | Admitting: Gastroenterology

## 2019-12-11 NOTE — Progress Notes (Deleted)
Jonathon Bellows MD, MRCP(U.K) [address]  [place]  [place], [place] [address]  Main: [phone]  Fax: [phone]   Primary Care Physician: Gae Bon, NP  Primary Gastroenterologist:  Dr. Jonathon Bellows   Acid reflux and dysphagia follow-up  HPI: Justin Powers is a 29 y.o. male    Summary of history :  Referred by Dr Tasia Catchings in Hematology foriron [place] seen on 10/30/2018.Rectal bleeding:few times when he wiped, sometimes a few spots and at one time a clot.Used to take a lot of [employer], stopped last year. Took it daily for a few years. Some epigastric pain, usually on and off , acid reflux in the past .   10/30/2018: Urine no blood, adno virus in stool PCR positive , Fecal calprotectin 79 (borderline), C diff negative. B12 and folate normal . H pylori breath test negative,TTG Ab-positive   11/08/2018 : EGD+duodenal, bx -normal duodenal villous pattern.Severe esophagitis lower end of the esophagus- commenced on PPI BID Colonoscopy : Normal TI, colon bx- normal. Rectum bx also normal . 03/06/2019: EGD- GE junction : stricture dilated to 15 mm .Grade A esophagitis seen and a large hiatal hernia.   03/26/2019 : Esophageal motility study at [facility]: esophageal dysmotility noted 05/01/2019: EGD: GE junction stricture dilated to 18 mm  Diarrhea better after course of xifaxan in 12/2018.  Interval history 09/10/2019-12/11/2019  At his last visit he had issues with dysphagia 09/21/2019 EGD: Medium sized hiatal hernia noted and a benign stricture at the GE junction was dilated from 15 mm to 18 mm with a balloon.  No esophagitis noted.  Since his last visit he has been able to take his Prilosec twice a day and along with with Pepcid at night.  With this regimen he has had absolutely no reflux symptoms.    We have recommended on multiple occasions that he should get in touch with [facility] bariatric surgery at [facility] to schedule a follow-up  visit.     Current Outpatient Medications  Medication Sig Dispense Refill  . albuterol (VENTOLIN HFA) 108 (90 Base) MCG/ACT inhaler 2 PUFF(S) INHALATION EVERY FOUR HOURS AS NEEDED    . atorvastatin (LIPITOR) 20 MG tablet Take 20 mg by mouth daily.    Marland Kitchen BREO ELLIPTA 100-25 MCG/INH AEPB 1 PUFF(S) INHALATION EVERY DAY    . buPROPion (WELLBUTRIN SR) 200 MG 12 hr tablet Take 200 mg by mouth 2 (two) times daily.    Hendricks Limes CONTINUING MONTH PAK 1 MG tablet See admin instructions.  2  . D3-50 1.25 MG (50000 UT) capsule Take 50,000 Units by mouth once a week.    . hydrOXYzine (ATARAX/VISTARIL) 25 MG tablet every 4 (four) hours as needed.     Marland Kitchen lisinopril (ZESTRIL) 10 MG tablet Take 10 mg by mouth daily.    Marland Kitchen LORazepam (ATIVAN) 1 MG tablet PLEASE SEE ATTACHED FOR DETAILED DIRECTIONS    . metoprolol succinate (TOPROL-XL) 25 MG 24 hr tablet     . omeprazole (PRILOSEC) 40 MG capsule TAKE 1 CAPSULE BY MOUTH TWICE A DAY 60 capsule 5  . VRAYLAR capsule Take 1.5 mg by mouth daily.     No current facility-administered medications for this visit.    Allergies as of 12/11/2019  . (No Known Allergies)    ROS:  General: Negative for anorexia, weight loss, fever, chills, fatigue, weakness. ENT: Negative for hoarseness, difficulty swallowing , nasal congestion. CV: Negative for chest pain, angina, palpitations, dyspnea on exertion, peripheral edema.  Respiratory: Negative for  dyspnea at rest, dyspnea on exertion, cough, sputum, wheezing.  GI: See history of present illness. GU:  Negative for dysuria, hematuria, urinary incontinence, urinary frequency, nocturnal urination.  Endo: Negative for unusual weight change.    Physical Examination:   There were no vitals taken for this visit.  General: Well-nourished, well-developed in no acute distress.  Eyes: No icterus. Conjunctivae pink. Mouth: Oropharyngeal mucosa moist and pink , no lesions erythema or exudate. Lungs: Clear to auscultation  bilaterally. Non-labored. Heart: Regular rate and rhythm, no murmurs rubs or gallops.  Abdomen: Bowel sounds are normal, nontender, nondistended, no hepatosplenomegaly or masses, no abdominal bruits or hernia , no rebound or guarding.   Extremities: No lower extremity edema. No clubbing or deformities. Neuro: Alert and oriented x 3.  Grossly intact. Skin: Warm and dry, no jaundice.   Psych: Alert and cooperative, normal mood and affect.   Imaging Studies: No results found.  Assessment and Plan:   Justin Powers is a 29 y.o. y/o male here to follow upGERD with severe esophagitis. Iron deficiency anemia likely secondary to severe acid reflux and esophagitis. Esophageal stricture that has been dilated to 15 mm in May 2020. Further dilated to 18 mm in July 2020 and December 2020.     Plan  1.Increase dose of omeprazole to 40 mg twice daily. I suggested he can purchase the omeprazole over-the-counter.  Continue Pepcid at night and Carafate. 2. I advised him to contact [facility] to schedule his follow-up appointment to have his hiatal hernia repaired.  He has been advised to do so on multiple occasions and been provided telephone number to contact them.    Dr [name]  MD,MRCP [facility]) Follow up in ***

## 2019-12-12 ENCOUNTER — Encounter: Payer: Self-pay | Admitting: Gastroenterology

## 2019-12-12 ENCOUNTER — Other Ambulatory Visit: Payer: Self-pay

## 2019-12-12 ENCOUNTER — Ambulatory Visit: Payer: 59 | Admitting: Gastroenterology

## 2019-12-12 VITALS — BP 140/88 | HR 105 | Temp 98.0°F | Ht 67.0 in | Wt 215.2 lb

## 2019-12-12 DIAGNOSIS — R1310 Dysphagia, unspecified: Secondary | ICD-10-CM

## 2019-12-12 DIAGNOSIS — K2100 Gastro-esophageal reflux disease with esophagitis, without bleeding: Secondary | ICD-10-CM | POA: Diagnosis not present

## 2019-12-12 DIAGNOSIS — K449 Diaphragmatic hernia without obstruction or gangrene: Secondary | ICD-10-CM | POA: Diagnosis not present

## 2019-12-12 NOTE — Progress Notes (Signed)
[name] MD, MRCP(U.K) [address]  [address]  [place], [place] [address]  Main: [phone]  Fax: [phone]   Primary Care Physician: Franciso Bend, NP  Primary Gastroenterologist:  Dr. [name]   GERD, hiatal hernia, esophageal stricture.  Follow-up  HPI: Justin Powers is a 29 y.o. male    Summary of history :  He was initially referred and seen in January 2020 for iron deficiency anemia by Dr. Cathie Hoops.  At that point he also had a bit of rectal bleeding.  H. pylori breath test was negative.  TTG antibody was positive.  Treated for an infectious diarrhea secondary to adenovirus.  He underwent an EGD in January 2020 which noted a large hiatal hernia and severe esophagitis.  Biopsies of the duodenum did not demonstrate any villous blunting.  A colonoscopy was also performed at that point of time with random colon biopsies were normal.  I referred him to [facility] to obtain repair of his hiatal hernia. Treated in March 2020 for IBS-D with Xifaxan which helped the diarrhea.  Missed appointment at [facility] and has not scheduled it   Provided him the telephone number to call them and schedule it.  03/26/2019 : Esophageal motility study at [facility]: esophageal dysmotility noted 05/01/2019: EGD: GE junction stricture dilated to 18 mm   Interval history 09/10/2019-12/11/2018 09/16/2019 at dysphagia and performed an upper endoscopy in 09/21/2019 which demonstrated no esophagitis but a stricture was dilated to 18 mm at the GE junction.  11/22/2019: Iron low at 29 percentage saturation 6.  Ferritin 13.5.  Hemoglobin 14.4 g.  Doing well no symptoms of acid reflux.  No issues with dysphagia.  Not yet been seen by [facility]. Current Outpatient Medications  Medication Sig Dispense Refill  . albuterol (VENTOLIN HFA) 108 (90 Base) MCG/ACT inhaler 2 PUFF(S) INHALATION EVERY FOUR HOURS AS NEEDED    . atorvastatin (LIPITOR) 20 MG tablet Take 20 mg by mouth daily.    Marland Kitchen BREO ELLIPTA 100-25 MCG/INH AEPB 1  PUFF(S) INHALATION EVERY DAY    . buPROPion (WELLBUTRIN SR) 200 MG 12 hr tablet Take 200 mg by mouth 2 (two) times daily.    Rhea Bleacher CONTINUING MONTH PAK 1 MG tablet See admin instructions.  2  . D3-50 1.25 MG (50000 UT) capsule Take 50,000 Units by mouth once a week.    . hydrOXYzine (ATARAX/VISTARIL) 25 MG tablet every 4 (four) hours as needed.     Marland Kitchen lisinopril (ZESTRIL) 10 MG tablet Take 10 mg by mouth daily.    Marland Kitchen LORazepam (ATIVAN) 1 MG tablet PLEASE SEE ATTACHED FOR DETAILED DIRECTIONS    . metoprolol succinate (TOPROL-XL) 25 MG 24 hr tablet     . omeprazole (PRILOSEC) 40 MG capsule TAKE 1 CAPSULE BY MOUTH TWICE A DAY 60 capsule 5  . VRAYLAR capsule Take 1.5 mg by mouth daily.     No current facility-administered medications for this visit.    Allergies as of 12/12/2019  . (No Known Allergies)    ROS:  General: Negative for anorexia, weight loss, fever, chills, fatigue, weakness. ENT: Negative for hoarseness, difficulty swallowing , nasal congestion. CV: Negative for chest pain, angina, palpitations, dyspnea on exertion, peripheral edema.  Respiratory: Negative for dyspnea at rest, dyspnea on exertion, cough, sputum, wheezing.  GI: See history of present illness. GU:  Negative for dysuria, hematuria, urinary incontinence, urinary frequency, nocturnal urination.  Endo: Negative for unusual weight change.    Physical Examination:   There were no vitals  taken for this visit.  General: Well-nourished, well-developed in no acute distress.  Neuro: Alert and oriented x 3.  Grossly intact. Psych: Alert and cooperative, normal mood and affect.   Imaging Studies: No results found.  Assessment and Plan:   Justin Powers is a 29 y.o. y/o malehere to follow upGERD with severe esophagitis. Iron deficiency anemia likely secondary to severe acid reflux and esophagitis. Esophageal stricture that has been dilated to 15 mm in May 2020,dilated to 18 mm in July 2020 and again in  December 2020.  Presently is asymptomatic from acid reflux and dysphagia.  Strongly suggested him to follow-up at [facility] to have his hiatal hernia fixed as this would be the only long-term solution to avoid taking medications for many years to come as he is very young.    Plan  1. Continue taking Prilosec and Pepcid combination. 2.  Strongly suggested to call at [facility] to get evaluated for repair of hiatal hernia and set up follow-up appointment telephone number provided yet again.   Dr Jonathon Bellows  MD,MRCP [facility]) Follow up in as needed

## 2019-12-21 ENCOUNTER — Other Ambulatory Visit: Payer: Self-pay

## 2019-12-21 ENCOUNTER — Inpatient Hospital Stay: Payer: 59 | Attending: Oncology

## 2019-12-21 DIAGNOSIS — K209 Esophagitis, unspecified without bleeding: Secondary | ICD-10-CM | POA: Insufficient documentation

## 2019-12-21 DIAGNOSIS — Z7951 Long term (current) use of inhaled steroids: Secondary | ICD-10-CM | POA: Insufficient documentation

## 2019-12-21 DIAGNOSIS — F17210 Nicotine dependence, cigarettes, uncomplicated: Secondary | ICD-10-CM | POA: Insufficient documentation

## 2019-12-21 DIAGNOSIS — D500 Iron deficiency anemia secondary to blood loss (chronic): Secondary | ICD-10-CM | POA: Diagnosis not present

## 2019-12-21 DIAGNOSIS — K449 Diaphragmatic hernia without obstruction or gangrene: Secondary | ICD-10-CM | POA: Insufficient documentation

## 2019-12-21 DIAGNOSIS — D72829 Elevated white blood cell count, unspecified: Secondary | ICD-10-CM | POA: Diagnosis not present

## 2019-12-21 DIAGNOSIS — I10 Essential (primary) hypertension: Secondary | ICD-10-CM | POA: Diagnosis not present

## 2019-12-21 DIAGNOSIS — Z79899 Other long term (current) drug therapy: Secondary | ICD-10-CM | POA: Insufficient documentation

## 2019-12-21 LAB — CBC WITH DIFFERENTIAL/PLATELET
Abs Immature Granulocytes: 0.07 10*3/uL (ref 0.00–0.07)
Basophils Absolute: 0.1 10*3/uL (ref 0.0–0.1)
Basophils Relative: 1 %
Eosinophils Absolute: 0.1 10*3/uL (ref 0.0–0.5)
Eosinophils Relative: 1 %
HCT: 45.4 % (ref 39.0–52.0)
Hemoglobin: 14.6 g/dL (ref 13.0–17.0)
Immature Granulocytes: 1 %
Lymphocytes Relative: 14 %
Lymphs Abs: 1.6 10*3/uL (ref 0.7–4.0)
MCH: 28.1 pg (ref 26.0–34.0)
MCHC: 32.2 g/dL (ref 30.0–36.0)
MCV: 87.5 fL (ref 80.0–100.0)
Monocytes Absolute: 0.9 10*3/uL (ref 0.1–1.0)
Monocytes Relative: 8 %
Neutro Abs: 8.7 10*3/uL — ABNORMAL HIGH (ref 1.7–7.7)
Neutrophils Relative %: 75 %
Platelets: 231 10*3/uL (ref 150–400)
RBC: 5.19 MIL/uL (ref 4.22–5.81)
RDW: 14.7 % (ref 11.5–15.5)
WBC: 11.4 10*3/uL — ABNORMAL HIGH (ref 4.0–10.5)
nRBC: 0 % (ref 0.0–0.2)

## 2019-12-21 LAB — IRON AND TIBC
Iron: 118 ug/dL (ref 45–182)
Saturation Ratios: 29 % (ref 17.9–39.5)
TIBC: 409 ug/dL (ref 250–450)
UIBC: 291 ug/dL

## 2019-12-21 LAB — FERRITIN: Ferritin: 79 ng/mL (ref 24–336)

## 2019-12-24 ENCOUNTER — Inpatient Hospital Stay (HOSPITAL_BASED_OUTPATIENT_CLINIC_OR_DEPARTMENT_OTHER): Payer: 59 | Admitting: Oncology

## 2019-12-24 ENCOUNTER — Encounter: Payer: Self-pay | Admitting: Oncology

## 2019-12-24 DIAGNOSIS — D72829 Elevated white blood cell count, unspecified: Secondary | ICD-10-CM

## 2019-12-24 DIAGNOSIS — K209 Esophagitis, unspecified without bleeding: Secondary | ICD-10-CM

## 2019-12-24 DIAGNOSIS — D500 Iron deficiency anemia secondary to blood loss (chronic): Secondary | ICD-10-CM

## 2019-12-24 NOTE — Progress Notes (Signed)
HEMATOLOGY-ONCOLOGY TeleHEALTH VISIT PROGRESS NOTE  I connected with [name] on 12/24/19 at  2:15 PM EST by video enabled telemedicine visit and verified that I am speaking with the correct person using two identifiers. I discussed the limitations, risks, security and privacy concerns of performing an evaluation and management service by telemedicine and the availability of in-person appointments. I also discussed with the patient that there may be a patient responsible charge related to this service. The patient expressed understanding and agreed to proceed.   Other persons participating in the visit and their role in the encounter:  None  Patient's location: at work  Provider's location: office Chief Complaint: iron deficiency anemia   INTERVAL HISTORY Dontrae Morini is a 29 y.o. male who has above history reviewed by me today presents for follow up visit for management of iron deficiency anemia.  Problems and complaints are listed below:  He feels well today. At work. No new complaints.  Patient was recently seen by gastroenterology Dr. Tobi Bastos. Note was reviewed.  Review of Systems  Constitutional: Negative for appetite change, chills, fatigue, fever and unexpected weight change.  HENT:   Negative for hearing loss and voice change.   Eyes: Negative for eye problems and icterus.  Respiratory: Negative for chest tightness, cough and shortness of breath.   Cardiovascular: Negative for chest pain and leg swelling.  Gastrointestinal: Negative for abdominal distention and abdominal pain.  Endocrine: Negative for hot flashes.  Genitourinary: Negative for difficulty urinating, dysuria and frequency.   Musculoskeletal: Negative for arthralgias.  Skin: Negative for itching and rash.  Neurological: Negative for light-headedness and numbness.  Hematological: Negative for adenopathy. Does not bruise/bleed easily.  Psychiatric/Behavioral: Negative for confusion.    Past Medical History:   Diagnosis Date  . Anemia   . Hypertension    Past Surgical History:  Procedure Laterality Date  . COLONOSCOPY WITH PROPOFOL N/A 11/08/2018   Procedure: COLONOSCOPY WITH PROPOFOL;  Surgeon: [name], MD;  Location: [facility] ENDOSCOPY;  Service: Gastroenterology;  Laterality: N/A;  . ESOPHAGOGASTRODUODENOSCOPY (EGD) WITH PROPOFOL N/A 11/08/2018   Procedure: ESOPHAGOGASTRODUODENOSCOPY (EGD) WITH PROPOFOL;  Surgeon: [name], MD;  Location: [facility] ENDOSCOPY;  Service: Gastroenterology;  Laterality: N/A;  . ESOPHAGOGASTRODUODENOSCOPY (EGD) WITH PROPOFOL N/A 03/06/2019   Procedure: ESOPHAGOGASTRODUODENOSCOPY (EGD) WITH PROPOFOL with Dilation;  Surgeon: [name], MD;  Location: [facility] ENDOSCOPY;  Service: Gastroenterology;  Laterality: N/A;  . ESOPHAGOGASTRODUODENOSCOPY (EGD) WITH PROPOFOL N/A 05/01/2019   Procedure: ESOPHAGOGASTRODUODENOSCOPY (EGD) WITH PROPOFOL with Dilation;  Surgeon: [name], MD;  Location: [facility] ENDOSCOPY;  Service: Gastroenterology;  Laterality: N/A;  . ESOPHAGOGASTRODUODENOSCOPY (EGD) WITH PROPOFOL N/A 09/21/2019   Procedure: ESOPHAGOGASTRODUODENOSCOPY (EGD) WITH PROPOFOL with Dilation;  Surgeon: [name], MD;  Location: [facility] ENDOSCOPY;  Service: Gastroenterology;  Laterality: N/A;  . wisdon tooth removal      Family History  Problem Relation Age of Onset  . Crohn's disease Mother   . Hypertension Mother   . Breast cancer Maternal Aunt   . Bladder Cancer Maternal Grandfather   . Cervical cancer Other   . Non-Hodgkin's lymphoma Other     Social History   Socioeconomic History  . Marital status: Married    Spouse name: Not on file  . Number of children: Not on file  . Years of education: Not on file  . Highest education level: Not on file  Occupational History  . Not on file  Tobacco Use  . Smoking status: Current Every Day Smoker    Packs/day: 1.00  Types: Cigarettes  . Smokeless tobacco: Never Used  Substance and Sexual Activity  . Alcohol use: Not  Currently    Comment: Occassionally   . Drug use: Never  . Sexual activity: Not on file  Other Topics Concern  . Not on file  Social History Narrative  . Not on file   Social Determinants of Health   Financial Resource Strain:   . Difficulty of Paying Living Expenses: Not on file  Food Insecurity:   . Worried About Charity fundraiser in the Last Year: Not on file  . Ran Out of Food in the Last Year: Not on file  Transportation Needs:   . Lack of Transportation (Medical): Not on file  . Lack of Transportation (Non-Medical): Not on file  Physical Activity:   . Days of Exercise per Week: Not on file  . Minutes of Exercise per Session: Not on file  Stress:   . Feeling of Stress : Not on file  Social Connections:   . Frequency of Communication with Friends and Family: Not on file  . Frequency of Social Gatherings with Friends and Family: Not on file  . Attends Religious Services: Not on file  . Active Member of Clubs or Organizations: Not on file  . Attends Archivist Meetings: Not on file  . Marital Status: Not on file  Intimate Partner Violence:   . Fear of Current or Ex-Partner: Not on file  . Emotionally Abused: Not on file  . Physically Abused: Not on file  . Sexually Abused: Not on file    Current Outpatient Medications on File Prior to Visit  Medication Sig Dispense Refill  . albuterol (VENTOLIN HFA) 108 (90 Base) MCG/ACT inhaler 2 PUFF(S) INHALATION EVERY FOUR HOURS AS NEEDED    . atorvastatin (LIPITOR) 20 MG tablet Take 20 mg by mouth daily.    Marland Kitchen BREO ELLIPTA 100-25 MCG/INH AEPB 1 PUFF(S) INHALATION EVERY DAY    . buPROPion (WELLBUTRIN SR) 200 MG 12 hr tablet Take 200 mg by mouth 2 (two) times daily.    Hendricks Limes CONTINUING MONTH PAK 1 MG tablet See admin instructions.  2  . D3-50 1.25 MG (50000 UT) capsule Take 50,000 Units by mouth once a week.    Marland Kitchen lisinopril (ZESTRIL) 10 MG tablet Take 10 mg by mouth daily.    Marland Kitchen LORazepam (ATIVAN) 1 MG tablet PLEASE  SEE ATTACHED FOR DETAILED DIRECTIONS    . omeprazole (PRILOSEC) 40 MG capsule TAKE 1 CAPSULE BY MOUTH TWICE A DAY 60 capsule 5  . VRAYLAR capsule Take 1.5 mg by mouth daily.    . hydrOXYzine (ATARAX/VISTARIL) 25 MG tablet every 4 (four) hours as needed.     . metoprolol succinate (TOPROL-XL) 25 MG 24 hr tablet      No current facility-administered medications on file prior to visit.    No Known Allergies     Observations/Objective: Today's Vitals   12/24/19 1355  PainSc: 0-No pain   There is no height or weight on file to calculate BMI.  Physical Exam  Constitutional: No distress.  Neurological: He is alert.    CBC    Component Value Date/Time   WBC 11.4 (H) 12/21/2019 1325   RBC 5.19 12/21/2019 1325   HGB 14.6 12/21/2019 1325   HCT 45.4 12/21/2019 1325   PLT 231 12/21/2019 1325   MCV 87.5 12/21/2019 1325   MCH 28.1 12/21/2019 1325   MCHC 32.2 12/21/2019 1325   RDW 14.7 12/21/2019 1325  LYMPHSABS 1.6 12/21/2019 1325   MONOABS 0.9 12/21/2019 1325   EOSABS 0.1 12/21/2019 1325   BASOSABS 0.1 12/21/2019 1325    CMP     Component Value Date/Time   NA 138 05/30/2019 1234   K 2.9 (L) 05/30/2019 1234   CL 104 05/30/2019 1234   CO2 23 05/30/2019 1234   GLUCOSE 128 (H) 05/30/2019 1234   BUN 16 05/30/2019 1234   CREATININE 0.92 05/30/2019 1234   CALCIUM 9.1 05/30/2019 1234   PROT 8.0 05/30/2019 1234   ALBUMIN 4.1 05/30/2019 1234   AST 60 (H) 05/30/2019 1234   ALT 34 05/30/2019 1234   ALKPHOS 112 05/30/2019 1234   BILITOT 0.6 05/30/2019 1234   GFRNONAA >60 05/30/2019 1234   GFRAA >60 05/30/2019 1234     Assessment and Plan: 1. Iron deficiency anemia due to chronic blood loss   2. Esophagitis   3. Leukocytosis, unspecified type     Labs reviewed and discussed with patient. Iron deficiency anemia secondary to chronic blood loss due to esophagitis. Patient's hemoglobin has been stable at 14.6, ferritin at 79, iron saturation 29. No iron deficiency.  Hold  additional IV iron treatments.  Gastroenterology recommendation was reviewed.  Patient has esophagitis secondary to hiatal hernia.  Dr. Tobi Bastos recommends patient to continue take Prilosec and Pepcid combination.  Suggest patient to call [facility] and get evaluated for  hiatal hernia repair.  Mild elevated white blood cell count, leukocytosis predominantly neutrophilia..  Likely reactive continue to monitor.   Follow Up Instructions: 6 months   I discussed the assessment and treatment plan with the patient. The patient was provided an opportunity to ask questions and all were answered. The patient agreed with the plan and demonstrated an understanding of the instructions.  The patient was advised to call back or seek an in-person evaluation if the symptoms worsen or if the condition fails to improve as anticipated.   Rickard Patience, MD 12/24/2019 9:07 PM

## 2019-12-24 NOTE — Progress Notes (Signed)
Patient verified using two identifiers for virtual visit via telephone today.  Patient does not offer any problems today.  

## 2020-05-13 IMAGING — CR DG CHEST 2V
1 series · 2 of 2 positions shown · non-contrast
Comparison: None.

CLINICAL DATA: Chest pain

EXAM:
CHEST - 2 VIEW

[Series 1: dg chest 2 view · 0.14mm/px · 2 of 2 slices shown]
[im 1/2]
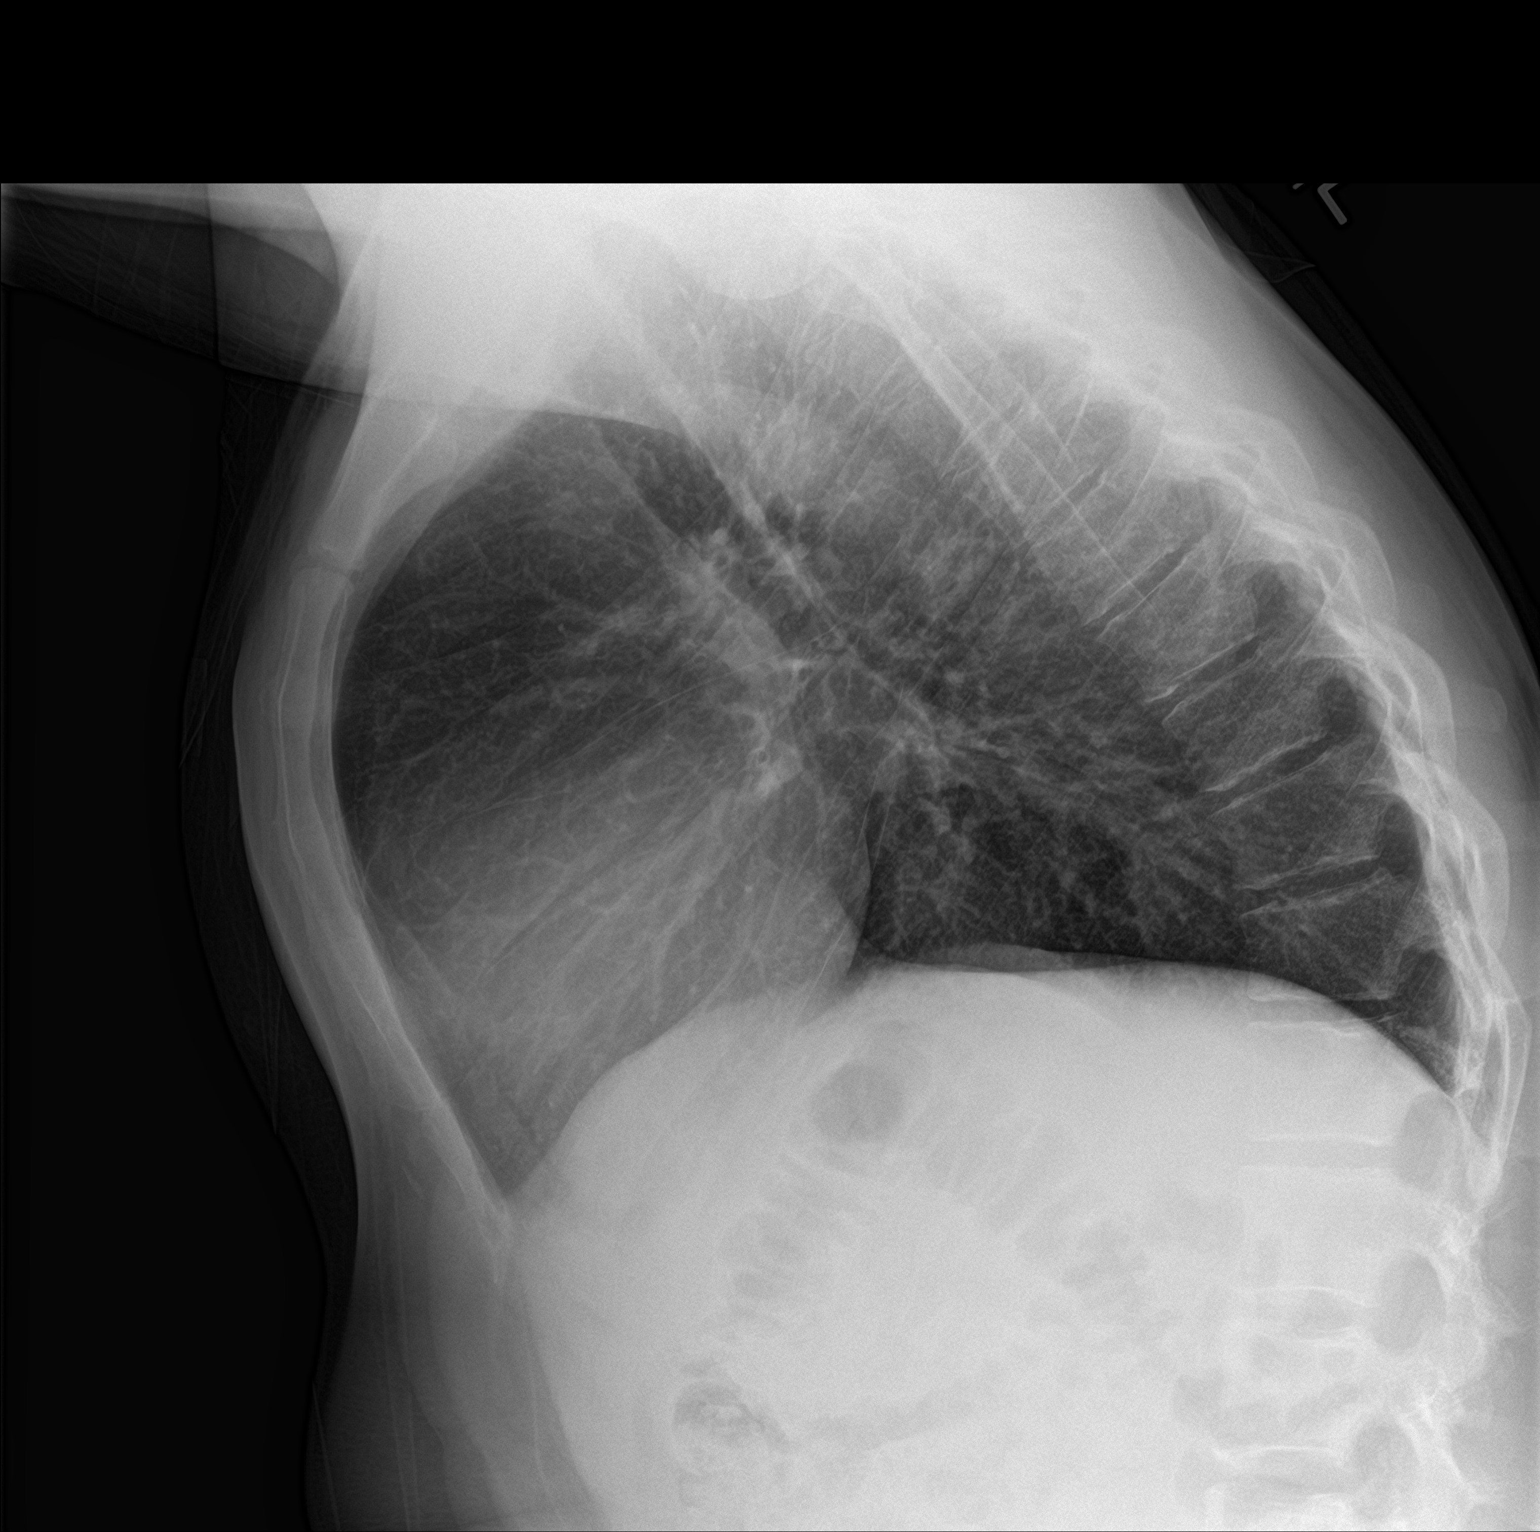
[im 2/2]
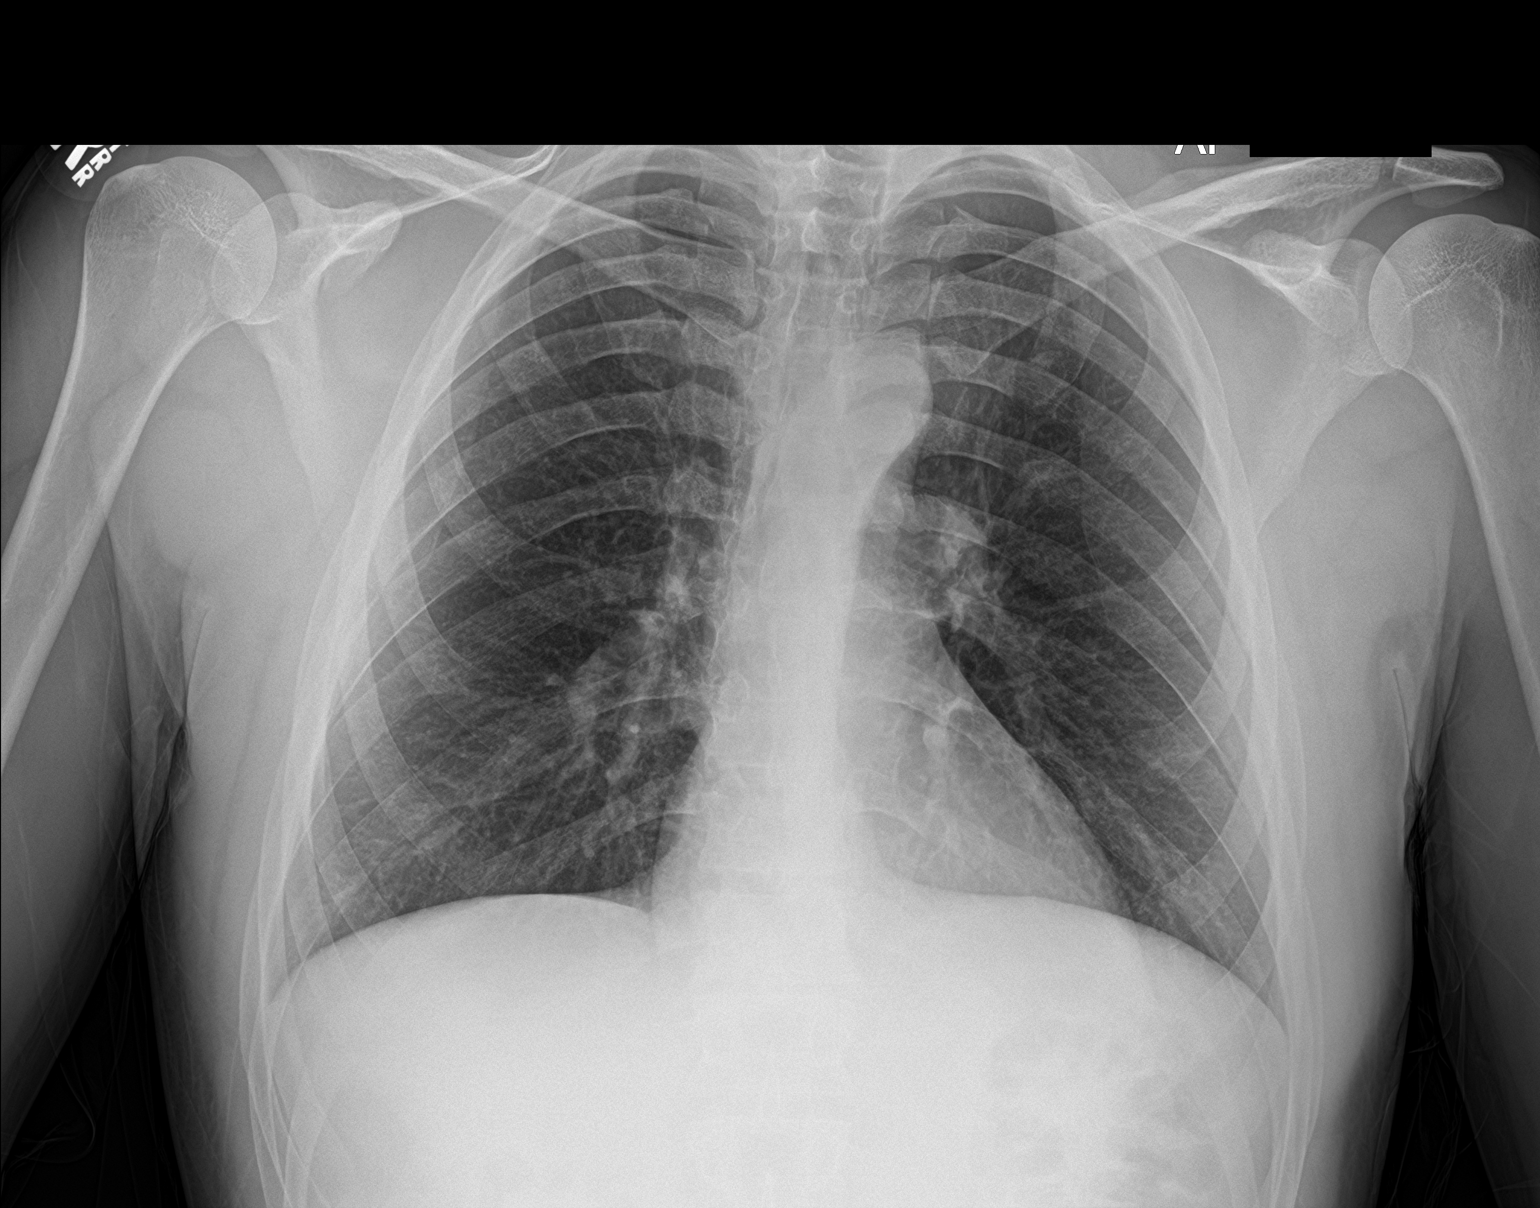

[2 of 2 positions shown; findings below may reference images not displayed]

FINDINGS: There is increased thoracic kyphosis with mild multilevel thoracic
vertebral body height loss. Lungs are clear. No pleural effusion or
pneumothorax. No focal airspace consolidation or pulmonary edema.
IMPRESSION: No active cardiopulmonary disease.

## 2020-05-22 ENCOUNTER — Other Ambulatory Visit: Payer: Self-pay | Admitting: Gastroenterology

## 2020-06-25 ENCOUNTER — Inpatient Hospital Stay: Payer: 59 | Attending: Oncology

## 2020-06-25 ENCOUNTER — Other Ambulatory Visit: Payer: Self-pay

## 2020-06-25 DIAGNOSIS — D509 Iron deficiency anemia, unspecified: Secondary | ICD-10-CM | POA: Insufficient documentation

## 2020-06-25 DIAGNOSIS — Z807 Family history of other malignant neoplasms of lymphoid, hematopoietic and related tissues: Secondary | ICD-10-CM | POA: Diagnosis not present

## 2020-06-25 DIAGNOSIS — D500 Iron deficiency anemia secondary to blood loss (chronic): Secondary | ICD-10-CM

## 2020-06-25 DIAGNOSIS — Z79899 Other long term (current) drug therapy: Secondary | ICD-10-CM | POA: Diagnosis not present

## 2020-06-25 DIAGNOSIS — Z8249 Family history of ischemic heart disease and other diseases of the circulatory system: Secondary | ICD-10-CM | POA: Insufficient documentation

## 2020-06-25 DIAGNOSIS — K219 Gastro-esophageal reflux disease without esophagitis: Secondary | ICD-10-CM | POA: Diagnosis not present

## 2020-06-25 DIAGNOSIS — Z8052 Family history of malignant neoplasm of bladder: Secondary | ICD-10-CM | POA: Insufficient documentation

## 2020-06-25 DIAGNOSIS — Z803 Family history of malignant neoplasm of breast: Secondary | ICD-10-CM | POA: Insufficient documentation

## 2020-06-25 DIAGNOSIS — Z8049 Family history of malignant neoplasm of other genital organs: Secondary | ICD-10-CM | POA: Insufficient documentation

## 2020-06-25 DIAGNOSIS — F17210 Nicotine dependence, cigarettes, uncomplicated: Secondary | ICD-10-CM | POA: Diagnosis not present

## 2020-06-25 DIAGNOSIS — I10 Essential (primary) hypertension: Secondary | ICD-10-CM | POA: Diagnosis not present

## 2020-06-25 LAB — CBC WITH DIFFERENTIAL/PLATELET
Abs Immature Granulocytes: 0.04 10*3/uL (ref 0.00–0.07)
Basophils Absolute: 0.1 10*3/uL (ref 0.0–0.1)
Basophils Relative: 1 %
Eosinophils Absolute: 0.1 10*3/uL (ref 0.0–0.5)
Eosinophils Relative: 1 %
HCT: 44.8 % (ref 39.0–52.0)
Hemoglobin: 14.8 g/dL (ref 13.0–17.0)
Immature Granulocytes: 0 %
Lymphocytes Relative: 21 %
Lymphs Abs: 2.1 10*3/uL (ref 0.7–4.0)
MCH: 28.7 pg (ref 26.0–34.0)
MCHC: 33 g/dL (ref 30.0–36.0)
MCV: 86.8 fL (ref 80.0–100.0)
Monocytes Absolute: 0.8 10*3/uL (ref 0.1–1.0)
Monocytes Relative: 8 %
Neutro Abs: 7 10*3/uL (ref 1.7–7.7)
Neutrophils Relative %: 69 %
Platelets: 236 10*3/uL (ref 150–400)
RBC: 5.16 MIL/uL (ref 4.22–5.81)
RDW: 14.2 % (ref 11.5–15.5)
WBC: 10 10*3/uL (ref 4.0–10.5)
nRBC: 0 % (ref 0.0–0.2)

## 2020-06-25 LAB — FERRITIN: Ferritin: 15 ng/mL — ABNORMAL LOW (ref 24–336)

## 2020-06-25 LAB — IRON AND TIBC
Iron: 51 ug/dL (ref 45–182)
Saturation Ratios: 11 % — ABNORMAL LOW (ref 17.9–39.5)
TIBC: 475 ug/dL — ABNORMAL HIGH (ref 250–450)
UIBC: 424 ug/dL

## 2020-06-27 ENCOUNTER — Encounter: Payer: Self-pay | Admitting: Oncology

## 2020-06-27 ENCOUNTER — Other Ambulatory Visit: Payer: Self-pay

## 2020-06-27 ENCOUNTER — Inpatient Hospital Stay (HOSPITAL_BASED_OUTPATIENT_CLINIC_OR_DEPARTMENT_OTHER): Payer: 59 | Admitting: Oncology

## 2020-06-27 VITALS — BP 129/86 | HR 97 | Temp 97.8°F | Resp 16 | Wt 203.8 lb

## 2020-06-27 DIAGNOSIS — D500 Iron deficiency anemia secondary to blood loss (chronic): Secondary | ICD-10-CM | POA: Diagnosis not present

## 2020-06-27 DIAGNOSIS — K219 Gastro-esophageal reflux disease without esophagitis: Secondary | ICD-10-CM | POA: Diagnosis not present

## 2020-06-27 DIAGNOSIS — D509 Iron deficiency anemia, unspecified: Secondary | ICD-10-CM | POA: Diagnosis not present

## 2020-06-27 NOTE — Progress Notes (Signed)
Patient reports that his energy level has not improved.

## 2020-06-27 NOTE — Progress Notes (Signed)
Hematology/Oncology follow up note [facility] [phone] [phone]   Patient Care Team: Franciso Bend, NP as PCP - General (Nurse Practitioner) Rickard Patience, MD as Consulting Physician (Hematology and Oncology)  REFERRING PROVIDER: Franciso Bend, NP CHIEF COMPLAINTS/REASON FOR VISIT:  Evaluation of anemia  HISTORY OF PRESENTING ILLNESS:  Justin Powers is a  29 y.o.  male with PMH listed below who was referred to me for evaluation of anemia Patient recently presented to ED after an episode of "upper chest tightness". Reports having multiple episodes for the past few months.  In ED, he had negative cardiology work up including negative EKG, troponin, hemoglobin was noted to be low at 10.6, microcytic.  He was sent home with presumed diagnosis of iron deficiency anemia and prescribed with iron supplements.  Also reports fatigue, progressively worsened lately.  He followed up with PCP and had additional work up done.  Reviewed patient's recent labs that was done at [facility] office. Labs revealed anemia with hemoglobin of 12, MCV 68, platelet count 378,000,  Differential showed increased basophils,  Iron panel showed saturation of 68%, iron 363, TIBC 537, normal TSH Reviewed patient's previous labs ordered by primary care physician's office, anemia is chronic onset , duration is since  No aggravating or improving factors.   #Previous gastroenterology work-up # EGD and colonoscopy on 11/08/2018 Non-bleeding internal hemorrhoids. - The examined portion of the ileum was normal. Biopsied. - The entire examined colon is normal. Biopsied. - The examination was otherwise normal. .A large hiatal hernia was present, LA Grade D (one or more mucosal breaks involving at least 75% of esophageal circumference) esophagitis with bleeding was found in the lower third of the esophagus. Normal examined duodenum. Biopsied.   EGD on 03/06/2019 which showed  large hiatal hernia, benign-appearing esophageal stenosis.  Dilated and biopsied.  Esophagitis.  Biopsy results showed stratified squamous epithelium with rare intraepithelial eosinophils and neutrophils, parakeratosis and a basal hyperplasia.  Negative for dysplasia and malignancy. #He had a repeat EGD on 7/14/2020Which showed large hiatal hernia, benign appearance esophageal stenosis.  Dilated.   INTERVAL HISTORY Justin Powers is a 29 y.o. male who has above history reviewed by me today presents for follow up visit for management of anemia.  Patient reports feeling more fatigued. Patient continues to have heartburn for which he takes omeprazole.  Due to insurance coverage, his only able to take omeprazole once daily.  Per patient he was recommended by gastroenterology to take omeprazole twice daily.  Patient has gained weight. He has no other new complaints.  Review of Systems  Constitutional: Positive for fatigue. Negative for appetite change, chills, fever and unexpected weight change.  HENT:   Negative for hearing loss and voice change.   Eyes: Negative for eye problems and icterus.  Respiratory: Negative for chest tightness, cough and shortness of breath.   Cardiovascular: Negative for chest pain and leg swelling.  Gastrointestinal: Negative for abdominal distention, abdominal pain, blood in stool and diarrhea.  Endocrine: Negative for hot flashes.  Genitourinary: Negative for difficulty urinating, dysuria and frequency.   Musculoskeletal: Negative for arthralgias.  Skin: Negative for itching and rash.  Neurological: Negative for headaches, light-headedness and numbness.  Hematological: Negative for adenopathy. Does not bruise/bleed easily.  Psychiatric/Behavioral: Negative for confusion. The patient is not nervous/anxious.     MEDICAL HISTORY:  Past Medical History:  Diagnosis Date  . Anemia   . Hypertension     SURGICAL HISTORY: Past Surgical History:  Procedure  Laterality Date  . COLONOSCOPY WITH PROPOFOL N/A 11/08/2018   Procedure: COLONOSCOPY WITH PROPOFOL;  Surgeon: Wyline MoodAnna, Kiran, MD;  [facility] END[facility]COPY;  Service: Gastroenterology;  Laterality: N/A;  . ESOPHAGOGASTRODUODENOSCOPY (EGD) WITH PROPOFOL N/A 11/08/2018   Procedure: ESOPHAGOGASTRODUODENOSCOPY (EGD) WITH PROPOFOL;  Surgeon: Wyline MoodAnna, Kiran, MD;  [facility];  Service: Gastroenterology;  Laterality: N/A;  . ESOPHAGOGASTRODUODENOSCOPY (EGD) WITH PROPOFOL N/A 03/06/2019   Procedure: ESOPHAGOGASTRODUODENOSCOPY (EGD) WITH PROPOFOL with Dilation;  Surgeon: Wyline MoodAnna, Kiran, MD;  [facility];  Service: Gastroenterology;  Laterality: N/A;  . ESOPHAGOGASTRODUODENOSCOPY (EGD) WITH PROPOFOL N/A 05/01/2019   Procedure: ESOPHAGOGASTRODUODENOSCOPY (EGD) WITH PROPOFOL with Dilation;  Surgeon: Wyline MoodAnna, Kiran, MD;  [facility];  Service: Gastroenterology;  Laterality: N/A;  . ESOPHAGOGASTRODUODENOSCOPY (EGD) WITH PROPOFOL N/A 09/21/2019   Procedure: ESOPHAGOGASTRODUODENOSCOPY (EGD) WITH PROPOFOL with Dilation;  Surgeon: Wyline MoodAnna, Kiran, MD;  [facility];  Service: Gastroenterology;  Laterality: N/A;  . wisdon tooth removal      SOCIAL HISTORY: Social History   Socioeconomic History  . Marital status: Married    Spouse name: Not on file  . Number of children: Not on file  . Years of education: Not on file  . Highest education level: Not on file  Occupational History  . Not on file  Tobacco Use  . Smoking status: Current Every Day Smoker    Packs/day: 1.00    Types: Cigarettes  . Smokeless tobacco: Never Used  Vaping Use  . Vaping Use: Never used  Substance and Sexual Activity  . Alcohol use: Not Currently    Comment: Occassionally   . Drug use: Never  . Sexual activity: Not on file  Other Topics Concern  . Not on file  Social History Narrative  . Not on file   Social Determinants of Health   Financial Resource Strain:   . Difficulty of Paying Living  Expenses: Not on file  Food Insecurity:   . Worried About Programme researcher, broadcasting/film/videounning Out of Food in the Last Year: Not on file  . Ran Out of Food in the Last Year: Not on file  Transportation Needs:   . Lack of Transportation (Medical): Not on file  . Lack of Transportation (Non-Medical): Not on file  Physical Activity:   . Days of Exercise per Week: Not on file  . Minutes of Exercise per Session: Not on file  Stress:   . Feeling of Stress : Not on file  Social Connections:   . Frequency of Communication with Friends and Family: Not on file  . Frequency of Social Gatherings with Friends and Family: Not on file  . Attends Religious Services: Not on file  . Active Member of Clubs or Organizations: Not on file  . Attends BankerClub or Organization Meetings: Not on file  . Marital Status: Not on file  Intimate Partner Violence:   . Fear of Current or Ex-Partner: Not on file  . Emotionally Abused: Not on file  . Physically Abused: Not on file  . Sexually Abused: Not on file    FAMILY HISTORY: Family History  Problem Relation Age of Onset  . Crohn's disease Mother   . Hypertension Mother   . Breast cancer Maternal Aunt   . Bladder Cancer Maternal Grandfather   . Cervical cancer Other   . Non-Hodgkin's lymphoma Other     ALLERGIES:  has No Known Allergies.  MEDICATIONS:  Current Outpatient Medications  Medication Sig Dispense Refill  . albuterol (VENTOLIN HFA) 108 (90 Base) MCG/ACT inhaler 2 PUFF(S) INHALATION EVERY  FOUR HOURS AS NEEDED    . atorvastatin (LIPITOR) 20 MG tablet Take 20 mg by mouth daily.    Marland Kitchen lisinopril (ZESTRIL) 10 MG tablet Take 10 mg by mouth daily.    Marland Kitchen omeprazole (PRILOSEC) 40 MG capsule TAKE 1 CAPSULE BY MOUTH TWICE A DAY (INS PAYS 30/30DAYS) 30 capsule 11  . BREO ELLIPTA 100-25 MCG/INH AEPB 1 PUFF(S) INHALATION EVERY DAY (Patient not taking: Reported on 06/27/2020)    . buPROPion (WELLBUTRIN SR) 200 MG 12 hr tablet Take 200 mg by mouth 2 (two) times daily. (Patient not taking:  Reported on 06/27/2020)    . CHANTIX CONTINUING MONTH PAK 1 MG tablet See admin instructions. (Patient not taking: Reported on 06/27/2020)  2  . D3-50 1.25 MG (50000 UT) capsule Take 50,000 Units by mouth once a week. (Patient not taking: Reported on 06/27/2020)    . hydrOXYzine (ATARAX/VISTARIL) 25 MG tablet every 4 (four) hours as needed.  (Patient not taking: Reported on 06/27/2020)    . LORazepam (ATIVAN) 1 MG tablet PLEASE SEE ATTACHED FOR DETAILED DIRECTIONS (Patient not taking: Reported on 06/27/2020)    . metoprolol succinate (TOPROL-XL) 25 MG 24 hr tablet  (Patient not taking: Reported on 06/27/2020)    . VRAYLAR capsule Take 1.5 mg by mouth daily. (Patient not taking: Reported on 06/27/2020)     No current facility-administered medications for this visit.     PHYSICAL EXAMINATION: ECOG PERFORMANCE STATUS: 0 - Asymptomatic Vitals:   06/27/20 1302  BP: 129/86  Pulse: 97  Resp: 16  Temp: 97.8 F (36.6 C)   Filed Weights   06/27/20 1302  Weight: 203 lb 12.8 oz (92.4 kg)    Physical Exam Constitutional:      General: He is not in acute distress.    Appearance: He is obese.  HENT:     Head: Normocephalic and atraumatic.  Eyes:     General: No scleral icterus.    Pupils: Pupils are equal, round, and reactive to light.  Cardiovascular:     Rate and Rhythm: Normal rate and regular rhythm.     Heart sounds: Normal heart sounds.  Pulmonary:     Effort: Pulmonary effort is normal. No respiratory distress.     Breath sounds: No wheezing.  Abdominal:     General: Bowel sounds are normal. There is no distension.     Palpations: Abdomen is soft. There is no mass.     Tenderness: There is no abdominal tenderness.  Musculoskeletal:        General: No deformity. Normal range of motion.     Cervical back: Normal range of motion and neck supple.  Skin:    General: Skin is warm and dry.     Coloration: Skin is not pale.     Findings: No erythema or rash.  Neurological:     Mental  Status: He is alert and oriented to person, place, and time.     Cranial Nerves: No cranial nerve deficit.     Coordination: Coordination normal.  Psychiatric:        Behavior: Behavior normal.        Thought Content: Thought content normal.      LABORATORY DATA:  I have reviewed the data as listed Lab Results  Component Value Date   WBC 10.0 06/25/2020   HGB 14.8 06/25/2020   HCT 44.8 06/25/2020   MCV 86.8 06/25/2020   PLT 236 06/25/2020   No results for input(s): NA, K, CL, CO2, GLUCOSE,  BUN, CREATININE, CALCIUM, GFRNONAA, GFRAA, PROT, ALBUMIN, AST, ALT, ALKPHOS, BILITOT, BILIDIR, IBILI in the last 8760 hours. Iron/TIBC/Ferritin/ %Sat    Component Value Date/Time   IRON 51 06/25/2020 1330   TIBC 475 (H) 06/25/2020 1330   FERRITIN 15 (L) 06/25/2020 1330   IRONPCTSAT 11 (L) 06/25/2020 1330        ASSESSMENT & PLAN:  1. Iron deficiency anemia due to chronic blood loss   2. Gastroesophageal reflux disease, unspecified whether esophagitis present    #Iron deficiency anemia, Labs are reviewed and discussed with patient. Iron panel has decreased. Hemoglobin remained stable. I recommend patient to proceed with IV Venofer 200 mg x 1.  #GERD, history of esophagitis.  Recommend patient continue PPI. Return to clinic for MD assessment and lab prior-CBC, iron, TIBC, ferritin in 4 months. +/- Venofer.   All questions were answered. The patient knows to call the clinic with any problems questions or concerns.    Rickard Patience, MD, PhD Hematology Oncology [facility] Pager- [phone] 06/27/2020

## 2020-07-11 ENCOUNTER — Other Ambulatory Visit: Payer: Self-pay

## 2020-07-11 ENCOUNTER — Inpatient Hospital Stay: Payer: 59

## 2020-07-11 VITALS — BP 121/81 | HR 85 | Temp 97.5°F | Resp 18

## 2020-07-11 DIAGNOSIS — D500 Iron deficiency anemia secondary to blood loss (chronic): Secondary | ICD-10-CM

## 2020-07-11 DIAGNOSIS — D509 Iron deficiency anemia, unspecified: Secondary | ICD-10-CM | POA: Diagnosis not present

## 2020-07-11 MED ORDER — SODIUM CHLORIDE 0.9 % IV SOLN
Freq: Once | INTRAVENOUS | Status: AC
  Filled 2020-07-11: qty 250

## 2020-07-11 MED ORDER — SODIUM CHLORIDE 0.9 % IV SOLN: 200.0000 mg | Freq: Once | INTRAVENOUS | Status: DC

## 2020-07-11 MED ORDER — IRON SUCROSE 20 MG/ML IV SOLN
200.0000 mg | Freq: Once | INTRAVENOUS | Status: AC
  Administered 2020-07-11: 200 mg via INTRAVENOUS
  Filled 2020-07-11: qty 10

## 2020-08-06 ENCOUNTER — Telehealth: Payer: Self-pay | Admitting: Gastroenterology

## 2020-08-06 NOTE — Telephone Encounter (Signed)
Jadijah refer to [facility] to a GI surgeon to fix hiatal hernia

## 2020-08-06 NOTE — Telephone Encounter (Signed)
Patient wanted to know the contact information to the [facility] Specialist to get his Hernia taking care of. Patient stated that Dr. Tobi Bastos has this information and will reach out on MY CHART as well, per patient. (Patient didn't want December appt)

## 2020-08-15 NOTE — Telephone Encounter (Signed)
Referral has been resent.

## 2020-08-19 DIAGNOSIS — K449 Diaphragmatic hernia without obstruction or gangrene: Secondary | ICD-10-CM | POA: Insufficient documentation

## 2020-10-23 ENCOUNTER — Other Ambulatory Visit: Payer: Self-pay

## 2020-10-23 DIAGNOSIS — D500 Iron deficiency anemia secondary to blood loss (chronic): Secondary | ICD-10-CM

## 2020-10-24 ENCOUNTER — Inpatient Hospital Stay: Payer: 59 | Attending: Oncology

## 2020-10-24 DIAGNOSIS — Z803 Family history of malignant neoplasm of breast: Secondary | ICD-10-CM | POA: Insufficient documentation

## 2020-10-24 DIAGNOSIS — D500 Iron deficiency anemia secondary to blood loss (chronic): Secondary | ICD-10-CM | POA: Diagnosis not present

## 2020-10-24 DIAGNOSIS — K449 Diaphragmatic hernia without obstruction or gangrene: Secondary | ICD-10-CM | POA: Diagnosis not present

## 2020-10-24 DIAGNOSIS — I10 Essential (primary) hypertension: Secondary | ICD-10-CM | POA: Insufficient documentation

## 2020-10-24 DIAGNOSIS — Z79899 Other long term (current) drug therapy: Secondary | ICD-10-CM | POA: Diagnosis not present

## 2020-10-24 DIAGNOSIS — Z7951 Long term (current) use of inhaled steroids: Secondary | ICD-10-CM | POA: Insufficient documentation

## 2020-10-24 DIAGNOSIS — Z8379 Family history of other diseases of the digestive system: Secondary | ICD-10-CM | POA: Insufficient documentation

## 2020-10-24 DIAGNOSIS — R5383 Other fatigue: Secondary | ICD-10-CM | POA: Insufficient documentation

## 2020-10-24 DIAGNOSIS — F17210 Nicotine dependence, cigarettes, uncomplicated: Secondary | ICD-10-CM | POA: Diagnosis not present

## 2020-10-24 LAB — CBC WITH DIFFERENTIAL/PLATELET
Abs Immature Granulocytes: 0.05 10*3/uL (ref 0.00–0.07)
Basophils Absolute: 0.1 10*3/uL (ref 0.0–0.1)
Basophils Relative: 1 %
Eosinophils Absolute: 0.2 10*3/uL (ref 0.0–0.5)
Eosinophils Relative: 2 %
HCT: 44.7 % (ref 39.0–52.0)
Hemoglobin: 15.4 g/dL (ref 13.0–17.0)
Immature Granulocytes: 1 %
Lymphocytes Relative: 23 %
Lymphs Abs: 2.3 10*3/uL (ref 0.7–4.0)
MCH: 30 pg (ref 26.0–34.0)
MCHC: 34.5 g/dL (ref 30.0–36.0)
MCV: 87 fL (ref 80.0–100.0)
Monocytes Absolute: 0.7 10*3/uL (ref 0.1–1.0)
Monocytes Relative: 8 %
Neutro Abs: 6.5 10*3/uL (ref 1.7–7.7)
Neutrophils Relative %: 65 %
Platelets: 362 10*3/uL (ref 150–400)
RBC: 5.14 MIL/uL (ref 4.22–5.81)
RDW: 14.7 % (ref 11.5–15.5)
WBC: 9.8 10*3/uL (ref 4.0–10.5)
nRBC: 0 % (ref 0.0–0.2)

## 2020-10-24 LAB — FERRITIN: Ferritin: 99 ng/mL (ref 24–336)

## 2020-10-24 LAB — IRON AND TIBC
Iron: 105 ug/dL (ref 45–182)
Saturation Ratios: 23 % (ref 17.9–39.5)
TIBC: 448 ug/dL (ref 250–450)
UIBC: 343 ug/dL

## 2020-10-27 ENCOUNTER — Inpatient Hospital Stay (HOSPITAL_BASED_OUTPATIENT_CLINIC_OR_DEPARTMENT_OTHER): Payer: 59 | Admitting: Oncology

## 2020-10-27 ENCOUNTER — Encounter: Payer: Self-pay | Admitting: Oncology

## 2020-10-27 ENCOUNTER — Other Ambulatory Visit: Payer: Self-pay

## 2020-10-27 DIAGNOSIS — D500 Iron deficiency anemia secondary to blood loss (chronic): Secondary | ICD-10-CM | POA: Diagnosis not present

## 2020-10-27 NOTE — Progress Notes (Signed)
HEMATOLOGY-ONCOLOGY TeleHEALTH VISIT PROGRESS NOTE  I connected with Justin Powers on 10/27/20 at  2:45 PM EST by video enabled telemedicine visit and verified that I am speaking with the correct person using two identifiers. I discussed the limitations, risks, security and privacy concerns of performing an evaluation and management service by telemedicine and the availability of in-person appointments. I also discussed with the patient that there may be a patient responsible charge related to this service. The patient expressed understanding and agreed to proceed.   Other persons participating in the visit and their role in the encounter:  None  Patient's location: at work  Provider's location: office Chief Complaint: iron deficiency anemia   INTERVAL HISTORY Justin Powers is a 30 y.o. male who has above history reviewed by me today presents for follow up visit for management of iron deficiency anemia.  Problems and complaints are listed below:  Patient is status post repair of paraesophageal hernia on 10/02/2020. He reports that he is recovering well.  Fatigue level has improved since previous IV iron treatments. No new complaints  Review of Systems  Constitutional: Negative for appetite change, chills, fatigue, fever and unexpected weight change.  HENT:   Negative for hearing loss and voice change.   Eyes: Negative for eye problems and icterus.  Respiratory: Negative for chest tightness, cough and shortness of breath.   Cardiovascular: Negative for chest pain and leg swelling.  Gastrointestinal: Negative for abdominal distention and abdominal pain.  Endocrine: Negative for hot flashes.  Genitourinary: Negative for difficulty urinating, dysuria and frequency.   Musculoskeletal: Negative for arthralgias.  Skin: Negative for itching and rash.  Neurological: Negative for light-headedness and numbness.  Hematological: Negative for adenopathy. Does not bruise/bleed easily.   Psychiatric/Behavioral: Negative for confusion.    Past Medical History:  Diagnosis Date  . Anemia   . Hypertension    Past Surgical History:  Procedure Laterality Date  . COLONOSCOPY WITH PROPOFOL N/A 11/08/2018   Procedure: COLONOSCOPY WITH PROPOFOL;  Surgeon: [name], MD;  Location: [facility] ENDOSCOPY;  Service: Gastroenterology;  Laterality: N/A;  . ESOPHAGOGASTRODUODENOSCOPY (EGD) WITH PROPOFOL N/A 11/08/2018   Procedure: ESOPHAGOGASTRODUODENOSCOPY (EGD) WITH PROPOFOL;  Surgeon: [name], MD;  Location: [facility] ENDOSCOPY;  Service: Gastroenterology;  Laterality: N/A;  . ESOPHAGOGASTRODUODENOSCOPY (EGD) WITH PROPOFOL N/A 03/06/2019   Procedure: ESOPHAGOGASTRODUODENOSCOPY (EGD) WITH PROPOFOL with Dilation;  Surgeon: [name], MD;  Location: [facility] ENDOSCOPY;  Service: Gastroenterology;  Laterality: N/A;  . ESOPHAGOGASTRODUODENOSCOPY (EGD) WITH PROPOFOL N/A 05/01/2019   Procedure: ESOPHAGOGASTRODUODENOSCOPY (EGD) WITH PROPOFOL with Dilation;  Surgeon: [name], MD;  Location: [facility] ENDOSCOPY;  Service: Gastroenterology;  Laterality: N/A;  . ESOPHAGOGASTRODUODENOSCOPY (EGD) WITH PROPOFOL N/A 09/21/2019   Procedure: ESOPHAGOGASTRODUODENOSCOPY (EGD) WITH PROPOFOL with Dilation;  Surgeon: [name], MD;  Location: [facility] ENDOSCOPY;  Service: Gastroenterology;  Laterality: N/A;  . wisdon tooth removal      Family History  Problem Relation Age of Onset  . Crohn's disease Mother   . Hypertension Mother   . Breast cancer Maternal Aunt   . Bladder Cancer Maternal Grandfather   . Cervical cancer Other   . Non-Hodgkin's lymphoma Other     Social History   Socioeconomic History  . Marital status: Married    Spouse name: Not on file  . Number of children: Not on file  . Years of education: Not on file  . Highest education level: Not on file  Occupational History  . Not on file  Tobacco Use  . Smoking status: Current Every Day  Smoker    Packs/day: 1.00    Types: Cigarettes  .  Smokeless tobacco: Never Used  Vaping Use  . Vaping Use: Never used  Substance and Sexual Activity  . Alcohol use: Not Currently    Comment: Occassionally   . Drug use: Never  . Sexual activity: Not on file  Other Topics Concern  . Not on file  Social History Narrative  . Not on file   Social Determinants of Health   Financial Resource Strain: Not on file  Food Insecurity: Not on file  Transportation Needs: Not on file  Physical Activity: Not on file  Stress: Not on file  Social Connections: Not on file  Intimate Partner Violence: Not on file    Current Outpatient Medications on File Prior to Visit  Medication Sig Dispense Refill  . albuterol (VENTOLIN HFA) 108 (90 Base) MCG/ACT inhaler 2 PUFF(S) INHALATION EVERY FOUR HOURS AS NEEDED    . atorvastatin (LIPITOR) 20 MG tablet Take 20 mg by mouth daily.    Marland Kitchen BREO ELLIPTA 100-25 MCG/INH AEPB 1 PUFF(S) INHALATION EVERY DAY (Patient not taking: Reported on 06/27/2020)    . buPROPion (WELLBUTRIN SR) 200 MG 12 hr tablet Take 200 mg by mouth 2 (two) times daily. (Patient not taking: Reported on 06/27/2020)    . CHANTIX CONTINUING MONTH PAK 1 MG tablet See admin instructions. (Patient not taking: Reported on 06/27/2020)  2  . D3-50 1.25 MG (50000 UT) capsule Take 50,000 Units by mouth once a week. (Patient not taking: Reported on 06/27/2020)    . hydrOXYzine (ATARAX/VISTARIL) 25 MG tablet every 4 (four) hours as needed.  (Patient not taking: Reported on 06/27/2020)    . lisinopril (ZESTRIL) 10 MG tablet Take 10 mg by mouth daily.    Marland Kitchen LORazepam (ATIVAN) 1 MG tablet PLEASE SEE ATTACHED FOR DETAILED DIRECTIONS (Patient not taking: Reported on 06/27/2020)    . metoprolol succinate (TOPROL-XL) 25 MG 24 hr tablet  (Patient not taking: Reported on 06/27/2020)    . omeprazole (PRILOSEC) 40 MG capsule TAKE 1 CAPSULE BY MOUTH TWICE A DAY (INS PAYS 30/30DAYS) 30 capsule 11  . VRAYLAR capsule Take 1.5 mg by mouth daily. (Patient not taking: Reported on  06/27/2020)     No current facility-administered medications on file prior to visit.    No Known Allergies     Observations/Objective: There were no vitals filed for this visit. There is no height or weight on file to calculate BMI.  Physical Exam Constitutional:      General: He is not in acute distress. Neurological:     Mental Status: He is alert.     CBC    Component Value Date/Time   WBC 9.8 10/24/2020 1251   RBC 5.14 10/24/2020 1251   HGB 15.4 10/24/2020 1251   HCT 44.7 10/24/2020 1251   PLT 362 10/24/2020 1251   MCV 87.0 10/24/2020 1251   MCH 30.0 10/24/2020 1251   MCHC 34.5 10/24/2020 1251   RDW 14.7 10/24/2020 1251   LYMPHSABS 2.3 10/24/2020 1251   MONOABS 0.7 10/24/2020 1251   EOSABS 0.2 10/24/2020 1251   BASOSABS 0.1 10/24/2020 1251    CMP     Component Value Date/Time   NA 138 05/30/2019 1234   K 2.9 (L) 05/30/2019 1234   CL 104 05/30/2019 1234   CO2 23 05/30/2019 1234   GLUCOSE 128 (H) 05/30/2019 1234   BUN 16 05/30/2019 1234   CREATININE 0.92 05/30/2019 1234   CALCIUM 9.1 05/30/2019 1234  PROT 8.0 05/30/2019 1234   ALBUMIN 4.1 05/30/2019 1234   AST 60 (H) 05/30/2019 1234   ALT 34 05/30/2019 1234   ALKPHOS 112 05/30/2019 1234   BILITOT 0.6 05/30/2019 1234   GFRNONAA >60 05/30/2019 1234   GFRAA >60 05/30/2019 1234     Assessment and Plan: 1. Iron deficiency anemia due to chronic blood loss     #Iron deficiency anemia, Labs are reviewed and discussed with patient. Stable iron store as well as stable and normal hemoglobin. No need for IV iron treatment at this point. He has had paraesophageal hernia repaired recently.  Hopefully this will take care of the etiology of GI blood loss/iron deficiency.  Follow Up Instructions: 4 months   I discussed the assessment and treatment plan with the patient. The patient was provided an opportunity to ask questions and all were answered. The patient agreed with the plan and demonstrated an  understanding of the instructions.  The patient was advised to call back or seek an in-person evaluation if the symptoms worsen or if the condition fails to improve as anticipated.   Rickard Patience, MD 10/27/2020 4:46 PM

## 2020-10-27 NOTE — Progress Notes (Signed)
Multiple unsuccessful attempts at contacting patient via phone to update chart for virtual visit with Dr. Cathie Hoops today.  Dr. Cathie Hoops was able to have the virtual visit today.

## 2020-10-28 ENCOUNTER — Inpatient Hospital Stay: Payer: 59

## 2021-02-20 ENCOUNTER — Other Ambulatory Visit: Payer: Self-pay

## 2021-02-20 ENCOUNTER — Inpatient Hospital Stay: Payer: 59 | Attending: Oncology

## 2021-02-20 DIAGNOSIS — R319 Hematuria, unspecified: Secondary | ICD-10-CM | POA: Diagnosis not present

## 2021-02-20 DIAGNOSIS — Z8379 Family history of other diseases of the digestive system: Secondary | ICD-10-CM | POA: Insufficient documentation

## 2021-02-20 DIAGNOSIS — D500 Iron deficiency anemia secondary to blood loss (chronic): Secondary | ICD-10-CM | POA: Diagnosis not present

## 2021-02-20 DIAGNOSIS — R200 Anesthesia of skin: Secondary | ICD-10-CM | POA: Diagnosis not present

## 2021-02-20 DIAGNOSIS — F17210 Nicotine dependence, cigarettes, uncomplicated: Secondary | ICD-10-CM | POA: Insufficient documentation

## 2021-02-20 DIAGNOSIS — Z803 Family history of malignant neoplasm of breast: Secondary | ICD-10-CM | POA: Insufficient documentation

## 2021-02-20 DIAGNOSIS — R5383 Other fatigue: Secondary | ICD-10-CM | POA: Diagnosis not present

## 2021-02-20 DIAGNOSIS — Z8249 Family history of ischemic heart disease and other diseases of the circulatory system: Secondary | ICD-10-CM | POA: Diagnosis not present

## 2021-02-20 DIAGNOSIS — Z8052 Family history of malignant neoplasm of bladder: Secondary | ICD-10-CM | POA: Insufficient documentation

## 2021-02-20 DIAGNOSIS — Z79899 Other long term (current) drug therapy: Secondary | ICD-10-CM | POA: Diagnosis not present

## 2021-02-20 DIAGNOSIS — Z8049 Family history of malignant neoplasm of other genital organs: Secondary | ICD-10-CM | POA: Diagnosis not present

## 2021-02-20 DIAGNOSIS — Z808 Family history of malignant neoplasm of other organs or systems: Secondary | ICD-10-CM | POA: Insufficient documentation

## 2021-02-20 LAB — CBC WITH DIFFERENTIAL/PLATELET
Abs Immature Granulocytes: 0.04 10*3/uL (ref 0.00–0.07)
Basophils Absolute: 0.1 10*3/uL (ref 0.0–0.1)
Basophils Relative: 1 %
Eosinophils Absolute: 0.1 10*3/uL (ref 0.0–0.5)
Eosinophils Relative: 1 %
HCT: 45.1 % (ref 39.0–52.0)
Hemoglobin: 14.8 g/dL (ref 13.0–17.0)
Immature Granulocytes: 0 %
Lymphocytes Relative: 32 %
Lymphs Abs: 3.1 10*3/uL (ref 0.7–4.0)
MCH: 30.5 pg (ref 26.0–34.0)
MCHC: 32.8 g/dL (ref 30.0–36.0)
MCV: 93 fL (ref 80.0–100.0)
Monocytes Absolute: 0.9 10*3/uL (ref 0.1–1.0)
Monocytes Relative: 9 %
Neutro Abs: 5.6 10*3/uL (ref 1.7–7.7)
Neutrophils Relative %: 57 %
Platelets: 315 10*3/uL (ref 150–400)
RBC: 4.85 MIL/uL (ref 4.22–5.81)
RDW: 13.3 % (ref 11.5–15.5)
WBC: 9.9 10*3/uL (ref 4.0–10.5)
nRBC: 0 % (ref 0.0–0.2)

## 2021-02-20 LAB — FERRITIN: Ferritin: 21 ng/mL — ABNORMAL LOW (ref 24–336)

## 2021-02-20 LAB — IRON AND TIBC
Iron: 35 ug/dL — ABNORMAL LOW (ref 45–182)
Saturation Ratios: 7 % — ABNORMAL LOW (ref 17.9–39.5)
TIBC: 472 ug/dL — ABNORMAL HIGH (ref 250–450)
UIBC: 437 ug/dL

## 2021-02-23 ENCOUNTER — Encounter: Payer: Self-pay | Admitting: Oncology

## 2021-02-23 ENCOUNTER — Other Ambulatory Visit: Payer: Self-pay

## 2021-02-23 ENCOUNTER — Inpatient Hospital Stay (HOSPITAL_BASED_OUTPATIENT_CLINIC_OR_DEPARTMENT_OTHER): Payer: 59 | Admitting: Oncology

## 2021-02-23 DIAGNOSIS — D500 Iron deficiency anemia secondary to blood loss (chronic): Secondary | ICD-10-CM | POA: Diagnosis not present

## 2021-02-23 NOTE — Progress Notes (Signed)
Patient contacted for Mychart visit. Pt reports numbness and stiffness to hands. He is currently on antibiotic for kidney stone related infection.

## 2021-02-23 NOTE — Progress Notes (Signed)
HEMATOLOGY-ONCOLOGY TeleHEALTH VISIT PROGRESS NOTE  I connected with Justin Powers on 02/23/21 at  2:00 PM EDT by video enabled telemedicine visit and verified that I am speaking with the correct person using two identifiers. I discussed the limitations, risks, security and privacy concerns of performing an evaluation and management service by telemedicine and the availability of in-person appointments. I also discussed with the patient that there may be a patient responsible charge related to this service. The patient expressed understanding and agreed to proceed.   Other persons participating in the visit and their role in the encounter:  None  Patient's location: at work  Provider's location: office Chief Complaint: iron deficiency anemia   INTERVAL HISTORY Justin Powers is a 30 y.o. male who has above history reviewed by me today presents for follow up visit for management of iron deficiency anemia.  Problems and complaints are listed below:  Had hematuria and was informed by PCP that he has UTI. On antibiotics.  Numbness stiffness of his hand.  More fatigue  Review of Systems  Constitutional: Negative for appetite change, chills, fatigue, fever and unexpected weight change.  HENT:   Negative for hearing loss and voice change.   Eyes: Negative for eye problems and icterus.  Respiratory: Negative for chest tightness, cough and shortness of breath.   Cardiovascular: Negative for chest pain and leg swelling.  Gastrointestinal: Negative for abdominal distention and abdominal pain.  Endocrine: Negative for hot flashes.  Genitourinary: Negative for difficulty urinating, dysuria and frequency.   Musculoskeletal: Negative for arthralgias.  Skin: Negative for itching and rash.  Neurological: Negative for light-headedness and numbness.  Hematological: Negative for adenopathy. Does not bruise/bleed easily.  Psychiatric/Behavioral: Negative for confusion.    Past Medical History:   Diagnosis Date  . Anemia   . Hypertension    Past Surgical History:  Procedure Laterality Date  . COLONOSCOPY WITH PROPOFOL N/A 11/08/2018   Procedure: COLONOSCOPY WITH PROPOFOL;  Surgeon: [name], MD;  Location: [facility] ENDOSCOPY;  Service: Gastroenterology;  Laterality: N/A;  . ESOPHAGOGASTRODUODENOSCOPY (EGD) WITH PROPOFOL N/A 11/08/2018   Procedure: ESOPHAGOGASTRODUODENOSCOPY (EGD) WITH PROPOFOL;  Surgeon: [name], MD;  Location: [facility] ENDOSCOPY;  Service: Gastroenterology;  Laterality: N/A;  . ESOPHAGOGASTRODUODENOSCOPY (EGD) WITH PROPOFOL N/A 03/06/2019   Procedure: ESOPHAGOGASTRODUODENOSCOPY (EGD) WITH PROPOFOL with Dilation;  Surgeon: [name], MD;  Location: [facility] ENDOSCOPY;  Service: Gastroenterology;  Laterality: N/A;  . ESOPHAGOGASTRODUODENOSCOPY (EGD) WITH PROPOFOL N/A 05/01/2019   Procedure: ESOPHAGOGASTRODUODENOSCOPY (EGD) WITH PROPOFOL with Dilation;  Surgeon: [name], MD;  Location: [facility] ENDOSCOPY;  Service: Gastroenterology;  Laterality: N/A;  . ESOPHAGOGASTRODUODENOSCOPY (EGD) WITH PROPOFOL N/A 09/21/2019   Procedure: ESOPHAGOGASTRODUODENOSCOPY (EGD) WITH PROPOFOL with Dilation;  Surgeon: [name], MD;  Location: [facility] ENDOSCOPY;  Service: Gastroenterology;  Laterality: N/A;  . wisdon tooth removal      Family History  Problem Relation Age of Onset  . Crohn's disease Mother   . Hypertension Mother   . Breast cancer Maternal Aunt   . Bladder Cancer Maternal Grandfather   . Cervical cancer Other   . Non-Hodgkin's lymphoma Other     Social History   Socioeconomic History  . Marital status: Married    Spouse name: Not on file  . Number of children: Not on file  . Years of education: Not on file  . Highest education level: Not on file  Occupational History  . Not on file  Tobacco Use  . Smoking status: Current Every Day Smoker    Packs/day: 1.00  Types: Cigarettes  . Smokeless tobacco: Never Used  Vaping Use  . Vaping Use: Never used  Substance and  Sexual Activity  . Alcohol use: Not Currently    Comment: Occassionally   . Drug use: Never  . Sexual activity: Not on file  Other Topics Concern  . Not on file  Social History Narrative  . Not on file   Social Determinants of Health   Financial Resource Strain: Not on file  Food Insecurity: Not on file  Transportation Needs: Not on file  Physical Activity: Not on file  Stress: Not on file  Social Connections: Not on file  Intimate Partner Violence: Not on file    Current Outpatient Medications on File Prior to Visit  Medication Sig Dispense Refill  . albuterol (VENTOLIN HFA) 108 (90 Base) MCG/ACT inhaler 2 PUFF(S) INHALATION EVERY FOUR HOURS AS NEEDED    . atorvastatin (LIPITOR) 20 MG tablet Take 20 mg by mouth daily.    . clonazePAM (KLONOPIN) 0.5 MG tablet Take 0.5 mg by mouth daily as needed.    Marland Kitchen levofloxacin (LEVAQUIN) 500 MG tablet Take 500 mg by mouth daily.    Marland Kitchen lisinopril (ZESTRIL) 10 MG tablet Take 10 mg by mouth daily.    Marland Kitchen omeprazole (PRILOSEC) 40 MG capsule TAKE 1 CAPSULE BY MOUTH TWICE A DAY (INS PAYS 30/30DAYS) 30 capsule 11  . ondansetron (ZOFRAN) 8 MG tablet Take by mouth.    . propranolol (INDERAL) 40 MG tablet Take 40 mg by mouth 2 (two) times daily.    . tamsulosin (FLOMAX) 0.4 MG CAPS capsule Take 0.4 mg by mouth daily.    Marland Kitchen BREO ELLIPTA 100-25 MCG/INH AEPB 1 PUFF(S) INHALATION EVERY DAY (Patient not taking: No sig reported)    . buPROPion (WELLBUTRIN SR) 200 MG 12 hr tablet Take 200 mg by mouth 2 (two) times daily. (Patient not taking: No sig reported)    . CHANTIX CONTINUING MONTH PAK 1 MG tablet See admin instructions. (Patient not taking: No sig reported)  2  . D3-50 1.25 MG (50000 UT) capsule Take 50,000 Units by mouth once a week. (Patient not taking: No sig reported)    . hydrOXYzine (ATARAX/VISTARIL) 25 MG tablet every 4 (four) hours as needed.  (Patient not taking: No sig reported)    . LORazepam (ATIVAN) 1 MG tablet PLEASE SEE ATTACHED FOR DETAILED  DIRECTIONS (Patient not taking: No sig reported)    . metoprolol succinate (TOPROL-XL) 25 MG 24 hr tablet  (Patient not taking: No sig reported)    . VRAYLAR capsule Take 1.5 mg by mouth daily. (Patient not taking: No sig reported)     No current facility-administered medications on file prior to visit.    No Known Allergies     Observations/Objective: Today's Vitals   02/23/21 1319  PainSc: 0-No pain   There is no height or weight on file to calculate BMI.  Physical Exam Constitutional:      General: He is not in acute distress. Neurological:     Mental Status: He is alert.     CBC    Component Value Date/Time   WBC 9.9 02/20/2021 1358   RBC 4.85 02/20/2021 1358   HGB 14.8 02/20/2021 1358   HCT 45.1 02/20/2021 1358   PLT 315 02/20/2021 1358   MCV 93.0 02/20/2021 1358   MCH 30.5 02/20/2021 1358   MCHC 32.8 02/20/2021 1358   RDW 13.3 02/20/2021 1358   LYMPHSABS 3.1 02/20/2021 1358   MONOABS 0.9 02/20/2021 1358  EOSABS 0.1 02/20/2021 1358   BASOSABS 0.1 02/20/2021 1358    CMP     Component Value Date/Time   NA 138 05/30/2019 1234   K 2.9 (L) 05/30/2019 1234   CL 104 05/30/2019 1234   CO2 23 05/30/2019 1234   GLUCOSE 128 (H) 05/30/2019 1234   BUN 16 05/30/2019 1234   CREATININE 0.92 05/30/2019 1234   CALCIUM 9.1 05/30/2019 1234   PROT 8.0 05/30/2019 1234   ALBUMIN 4.1 05/30/2019 1234   AST 60 (H) 05/30/2019 1234   ALT 34 05/30/2019 1234   ALKPHOS 112 05/30/2019 1234   BILITOT 0.6 05/30/2019 1234   GFRNONAA >60 05/30/2019 1234   GFRAA >60 05/30/2019 1234     Assessment and Plan: 1. Iron deficiency anemia due to chronic blood loss     #Iron deficiency anemia, Labs are reviewed and discussed with patient. Iron store has decreased. Hemoglobin is normal Recommend IV venofer weekly x 2.    Follow Up Instructions: 3 months   I discussed the assessment and treatment plan with the patient. The patient was provided an opportunity to ask questions and all  were answered. The patient agreed with the plan and demonstrated an understanding of the instructions.  The patient was advised to call back or seek an in-person evaluation if the symptoms worsen or if the condition fails to improve as anticipated.   Rickard Patience, MD 02/23/2021 10:33 PM

## 2021-02-27 ENCOUNTER — Inpatient Hospital Stay: Payer: 59

## 2021-02-27 ENCOUNTER — Other Ambulatory Visit: Payer: Self-pay

## 2021-02-27 VITALS — BP 127/77 | HR 97 | Temp 97.0°F | Resp 18

## 2021-02-27 DIAGNOSIS — D500 Iron deficiency anemia secondary to blood loss (chronic): Secondary | ICD-10-CM | POA: Diagnosis not present

## 2021-02-27 MED ORDER — SODIUM CHLORIDE 0.9 % IV SOLN
Freq: Once | INTRAVENOUS | Status: AC
Start: 2021-02-27 — End: 2021-02-27
  Filled 2021-02-27: qty 250

## 2021-02-27 MED ORDER — IRON SUCROSE 20 MG/ML IV SOLN
200.0000 mg | Freq: Once | INTRAVENOUS | Status: AC
  Administered 2021-02-27: 200 mg via INTRAVENOUS
  Filled 2021-02-27: qty 10

## 2021-02-27 MED ORDER — SODIUM CHLORIDE 0.9 % IV SOLN
200.0000 mg | Freq: Once | INTRAVENOUS | Status: DC
  Filled 2021-02-27: qty 10

## 2021-03-06 ENCOUNTER — Other Ambulatory Visit: Payer: Self-pay

## 2021-03-06 ENCOUNTER — Inpatient Hospital Stay: Payer: 59

## 2021-03-06 VITALS — BP 110/69 | HR 85 | Temp 96.5°F | Resp 18

## 2021-03-06 DIAGNOSIS — D500 Iron deficiency anemia secondary to blood loss (chronic): Secondary | ICD-10-CM

## 2021-03-06 MED ORDER — IRON SUCROSE 20 MG/ML IV SOLN
200.0000 mg | Freq: Once | INTRAVENOUS | Status: AC
  Administered 2021-03-06: 200 mg via INTRAVENOUS
  Filled 2021-03-06: qty 10

## 2021-03-06 MED ORDER — SODIUM CHLORIDE 0.9 % IV SOLN
Freq: Once | INTRAVENOUS | Status: AC
  Filled 2021-03-06: qty 250

## 2021-03-06 MED ORDER — SODIUM CHLORIDE 0.9 % IV SOLN: 200.0000 mg | Freq: Once | INTRAVENOUS | Status: DC

## 2021-03-06 NOTE — Patient Instructions (Signed)

## 2021-04-28 DIAGNOSIS — Z8719 Personal history of other diseases of the digestive system: Secondary | ICD-10-CM | POA: Insufficient documentation

## 2021-05-25 ENCOUNTER — Inpatient Hospital Stay: Payer: 59 | Attending: Oncology

## 2021-05-25 DIAGNOSIS — D500 Iron deficiency anemia secondary to blood loss (chronic): Secondary | ICD-10-CM | POA: Diagnosis present

## 2021-05-25 DIAGNOSIS — I10 Essential (primary) hypertension: Secondary | ICD-10-CM | POA: Insufficient documentation

## 2021-05-25 DIAGNOSIS — R319 Hematuria, unspecified: Secondary | ICD-10-CM | POA: Diagnosis not present

## 2021-05-25 DIAGNOSIS — F17210 Nicotine dependence, cigarettes, uncomplicated: Secondary | ICD-10-CM | POA: Insufficient documentation

## 2021-05-25 DIAGNOSIS — Z807 Family history of other malignant neoplasms of lymphoid, hematopoietic and related tissues: Secondary | ICD-10-CM | POA: Diagnosis not present

## 2021-05-25 DIAGNOSIS — Z808 Family history of malignant neoplasm of other organs or systems: Secondary | ICD-10-CM | POA: Insufficient documentation

## 2021-05-25 DIAGNOSIS — Z8052 Family history of malignant neoplasm of bladder: Secondary | ICD-10-CM | POA: Insufficient documentation

## 2021-05-25 DIAGNOSIS — K922 Gastrointestinal hemorrhage, unspecified: Secondary | ICD-10-CM | POA: Diagnosis not present

## 2021-05-25 DIAGNOSIS — Z803 Family history of malignant neoplasm of breast: Secondary | ICD-10-CM | POA: Insufficient documentation

## 2021-05-25 LAB — IRON AND TIBC
Iron: 94 ug/dL (ref 45–182)
Saturation Ratios: 24 % (ref 17.9–39.5)
TIBC: 399 ug/dL (ref 250–450)
UIBC: 305 ug/dL

## 2021-05-25 LAB — FERRITIN: Ferritin: 171 ng/mL (ref 24–336)

## 2021-05-25 LAB — URINALYSIS, COMPLETE (UACMP) WITH MICROSCOPIC
Bacteria, UA: NONE SEEN
Bilirubin Urine: NEGATIVE
Glucose, UA: NEGATIVE mg/dL
Hgb urine dipstick: NEGATIVE
Ketones, ur: NEGATIVE mg/dL
Leukocytes,Ua: NEGATIVE
Nitrite: NEGATIVE
Protein, ur: NEGATIVE mg/dL
Specific Gravity, Urine: 1.005 (ref 1.005–1.030)
Squamous Epithelial / HPF: NONE SEEN (ref 0–5)
pH: 9 — ABNORMAL HIGH (ref 5.0–8.0)

## 2021-05-25 LAB — CBC WITH DIFFERENTIAL/PLATELET
Abs Immature Granulocytes: 0.09 10*3/uL — ABNORMAL HIGH (ref 0.00–0.07)
Basophils Absolute: 0.1 10*3/uL (ref 0.0–0.1)
Basophils Relative: 1 %
Eosinophils Absolute: 0.1 10*3/uL (ref 0.0–0.5)
Eosinophils Relative: 1 %
HCT: 45.6 % (ref 39.0–52.0)
Hemoglobin: 15.1 g/dL (ref 13.0–17.0)
Immature Granulocytes: 1 %
Lymphocytes Relative: 26 %
Lymphs Abs: 3.1 10*3/uL (ref 0.7–4.0)
MCH: 30.3 pg (ref 26.0–34.0)
MCHC: 33.1 g/dL (ref 30.0–36.0)
MCV: 91.6 fL (ref 80.0–100.0)
Monocytes Absolute: 0.5 10*3/uL (ref 0.1–1.0)
Monocytes Relative: 5 %
Neutro Abs: 7.8 10*3/uL — ABNORMAL HIGH (ref 1.7–7.7)
Neutrophils Relative %: 66 %
Platelets: 290 10*3/uL (ref 150–400)
RBC: 4.98 MIL/uL (ref 4.22–5.81)
RDW: 15.5 % (ref 11.5–15.5)
WBC: 11.7 10*3/uL — ABNORMAL HIGH (ref 4.0–10.5)
nRBC: 0 % (ref 0.0–0.2)

## 2021-05-29 ENCOUNTER — Inpatient Hospital Stay (HOSPITAL_BASED_OUTPATIENT_CLINIC_OR_DEPARTMENT_OTHER): Payer: 59 | Admitting: Oncology

## 2021-05-29 ENCOUNTER — Inpatient Hospital Stay: Payer: 59

## 2021-05-29 ENCOUNTER — Other Ambulatory Visit: Payer: Self-pay | Admitting: Lab

## 2021-05-29 ENCOUNTER — Other Ambulatory Visit: Payer: Self-pay

## 2021-05-29 ENCOUNTER — Encounter: Payer: Self-pay | Admitting: Oncology

## 2021-05-29 VITALS — BP 134/92 | HR 84 | Temp 98.7°F | Wt 204.9 lb

## 2021-05-29 DIAGNOSIS — R300 Dysuria: Secondary | ICD-10-CM | POA: Diagnosis not present

## 2021-05-29 DIAGNOSIS — D500 Iron deficiency anemia secondary to blood loss (chronic): Secondary | ICD-10-CM | POA: Diagnosis not present

## 2021-05-29 LAB — URINALYSIS, COMPLETE (UACMP) WITH MICROSCOPIC
Bilirubin Urine: NEGATIVE
Glucose, UA: NEGATIVE mg/dL
Hgb urine dipstick: NEGATIVE
Ketones, ur: NEGATIVE mg/dL
Leukocytes,Ua: NEGATIVE
Nitrite: NEGATIVE
Protein, ur: NEGATIVE mg/dL
Specific Gravity, Urine: 1.016 (ref 1.005–1.030)
pH: 5 (ref 5.0–8.0)

## 2021-05-29 NOTE — Progress Notes (Signed)
HEMATOLOGY-ONCOLOGY TeleHEALTH VISIT PROGRESS NOTE  I connected with Justin Powers on 05/29/21 at  2:30 PM EDT by video enabled telemedicine visit and verified that I am speaking with the correct person using two identifiers. I discussed the limitations, risks, security and privacy concerns of performing an evaluation and management service by telemedicine and the availability of in-person appointments. I also discussed with the patient that there may be a patient responsible charge related to this service. The patient expressed understanding and agreed to proceed.   Other persons participating in the visit and their role in the encounter:  None  Patient's location: at work  Provider's location: office  Chief Complaint: iron deficiency anemia   INTERVAL HISTORY Justin Powers is a 30 year old male with past medical history significant for erosive esophagitis, dysphagia, stricture and stenosis and iron deficiency anemia due to chronic blood loss.  He receives intermittent iron and was last given this in May 2022.  In the interim, reports persistent hematuria although he has been treated on several occasions for a UTI.  States this is just his normal now.  Also reports of rectal bleeding with bowel movements.  Describes the color as red and dark red.  Denies any abdominal concerns.  Reports stable fatigue.   He was seen by GI Dr. Tobi Bastos back in January 2020 and had a colonoscopy which was normal other than internal hemorrhoids.  He has had several upper endoscopies for benign-appearing esophageal stenosis which he has had dilated.  He also has history of hiatal hernia surgery at [facility] with Dr. Augustine Radar with recurrence.  Plan is for robotic hiatal hernia repair likely with mesh in the future.  Review of Systems  Constitutional:  Positive for fatigue. Negative for appetite change, chills and fever.  HENT:  Negative.  Negative for hearing loss, lump/mass, mouth sores and nosebleeds.   Eyes: Negative.   Negative for eye problems.  Respiratory:  Negative for cough, hemoptysis and shortness of breath.   Cardiovascular: Negative.  Negative for chest pain and leg swelling.  Gastrointestinal:  Positive for blood in stool. Negative for abdominal pain, constipation, diarrhea, nausea and vomiting.  Endocrine: Negative.  Negative for hot flashes.  Genitourinary:  Positive for dysuria and hematuria. Negative for bladder incontinence, difficulty urinating and frequency.   Musculoskeletal: Negative.  Negative for back pain, flank pain, gait problem and myalgias.  Skin: Negative.  Negative for itching and rash.  Neurological: Negative.  Negative for dizziness, gait problem, headaches, light-headedness and numbness.  Hematological: Negative.  Negative for adenopathy.  Psychiatric/Behavioral:  Negative for confusion. The patient is not nervous/anxious.    Past Medical History:  Diagnosis Date   Anemia    Hypertension    Past Surgical History:  Procedure Laterality Date   COLONOSCOPY WITH PROPOFOL N/A 11/08/2018   Procedure: COLONOSCOPY WITH PROPOFOL;  Surgeon: [name], MD;  Location: [facility] ENDOSCOPY;  Service: Gastroenterology;  Laterality: N/A;   ESOPHAGOGASTRODUODENOSCOPY (EGD) WITH PROPOFOL N/A 11/08/2018   Procedure: ESOPHAGOGASTRODUODENOSCOPY (EGD) WITH PROPOFOL;  Surgeon: [name], MD;  Location: [facility] ENDOSCOPY;  Service: Gastroenterology;  Laterality: N/A;   ESOPHAGOGASTRODUODENOSCOPY (EGD) WITH PROPOFOL N/A 03/06/2019   Procedure: ESOPHAGOGASTRODUODENOSCOPY (EGD) WITH PROPOFOL with Dilation;  Surgeon: [name], MD;  Location: [facility] ENDOSCOPY;  Service: Gastroenterology;  Laterality: N/A;   ESOPHAGOGASTRODUODENOSCOPY (EGD) WITH PROPOFOL N/A 05/01/2019   Procedure: ESOPHAGOGASTRODUODENOSCOPY (EGD) WITH PROPOFOL with Dilation;  Surgeon: [name], MD;  Location: [facility] ENDOSCOPY;  Service: Gastroenterology;  Laterality: N/A;   ESOPHAGOGASTRODUODENOSCOPY (EGD) WITH PROPOFOL N/A 09/21/2019  Procedure: ESOPHAGOGASTRODUODENOSCOPY (EGD) WITH PROPOFOL with Dilation;  Surgeon: [name], MD;  Location: [facility] ENDOSCOPY;  Service: Gastroenterology;  Laterality: N/A;   wisdon tooth removal      Family History  Problem Relation Age of Onset   Crohn's disease Mother    Hypertension Mother    Breast cancer Maternal Aunt    Bladder Cancer Maternal Grandfather    Cervical cancer Other    Non-Hodgkin's lymphoma Other     Social History   Socioeconomic History   Marital status: Married    Spouse name: Not on file   Number of children: Not on file   Years of education: Not on file   Highest education level: Not on file  Occupational History   Not on file  Tobacco Use   Smoking status: Every Day    Packs/day: 1.00    Types: Cigarettes   Smokeless tobacco: Never  Vaping Use   Vaping Use: Never used  Substance and Sexual Activity   Alcohol use: Not Currently    Comment: Occassionally    Drug use: Never   Sexual activity: Not on file  Other Topics Concern   Not on file  Social History Narrative   Not on file   Social Determinants of Health   Financial Resource Strain: Not on file  Food Insecurity: Not on file  Transportation Needs: Not on file  Physical Activity: Not on file  Stress: Not on file  Social Connections: Not on file  Intimate Partner Violence: Not on file    Current Outpatient Medications on File Prior to Visit  Medication Sig Dispense Refill   albuterol (VENTOLIN HFA) 108 (90 Base) MCG/ACT inhaler 2 PUFF(S) INHALATION EVERY FOUR HOURS AS NEEDED     atorvastatin (LIPITOR) 20 MG tablet Take 20 mg by mouth daily.     clonazePAM (KLONOPIN) 0.5 MG tablet Take 0.5 mg by mouth daily as needed.     levofloxacin (LEVAQUIN) 500 MG tablet Take 500 mg by mouth daily.     lisinopril (ZESTRIL) 10 MG tablet Take 10 mg by mouth daily.     omeprazole (PRILOSEC) 40 MG capsule TAKE 1 CAPSULE BY MOUTH TWICE A DAY (INS PAYS 30/30DAYS) 30 capsule 11   ondansetron (ZOFRAN) 8  MG tablet Take by mouth.     propranolol (INDERAL) 40 MG tablet Take 40 mg by mouth 2 (two) times daily.     BREO ELLIPTA 100-25 MCG/INH AEPB 1 PUFF(S) INHALATION EVERY DAY (Patient not taking: No sig reported)     buPROPion (WELLBUTRIN SR) 200 MG 12 hr tablet Take 200 mg by mouth 2 (two) times daily. (Patient not taking: No sig reported)     CHANTIX CONTINUING MONTH PAK 1 MG tablet See admin instructions. (Patient not taking: No sig reported)  2   D3-50 1.25 MG (50000 UT) capsule Take 50,000 Units by mouth once a week. (Patient not taking: No sig reported)     hydrOXYzine (ATARAX/VISTARIL) 25 MG tablet every 4 (four) hours as needed.  (Patient not taking: No sig reported)     LORazepam (ATIVAN) 1 MG tablet PLEASE SEE ATTACHED FOR DETAILED DIRECTIONS (Patient not taking: No sig reported)     metoprolol succinate (TOPROL-XL) 25 MG 24 hr tablet  (Patient not taking: No sig reported)     tamsulosin (FLOMAX) 0.4 MG CAPS capsule Take 0.4 mg by mouth daily. (Patient not taking: Reported on 05/29/2021)     VRAYLAR capsule Take 1.5 mg by mouth daily. (Patient not taking: No  sig reported)     No current facility-administered medications on file prior to visit.    No Known Allergies     Observations/Objective: Today's Vitals   05/29/21 1452  BP: (!) 134/92  Pulse: 84  Temp: 98.7 F (37.1 C)  SpO2: 96%  Weight: 204 lb 14.4 oz (92.9 kg)  PainSc: 0-No pain   Body mass index is 32.09 kg/m.  Physical Exam Constitutional:      Appearance: Normal appearance. He is obese.  HENT:     Head: Normocephalic and atraumatic.  Eyes:     Pupils: Pupils are equal, round, and reactive to light.  Cardiovascular:     Rate and Rhythm: Normal rate and regular rhythm.     Heart sounds: Normal heart sounds. No murmur heard. Pulmonary:     Effort: Pulmonary effort is normal.     Breath sounds: Normal breath sounds. No wheezing.  Abdominal:     General: Bowel sounds are normal. There is no distension.      Palpations: Abdomen is soft.     Tenderness: There is no abdominal tenderness.  Musculoskeletal:        General: Normal range of motion.     Cervical back: Normal range of motion.  Skin:    General: Skin is warm and dry.     Findings: No rash.  Neurological:     Mental Status: He is alert and oriented to person, place, and time.     Gait: Gait is intact.  Psychiatric:        Mood and Affect: Mood and affect normal.        Cognition and Memory: Memory normal.        Judgment: Judgment normal.    CBC    Component Value Date/Time   WBC 11.7 (H) 05/25/2021 1456   RBC 4.98 05/25/2021 1456   HGB 15.1 05/25/2021 1456   HCT 45.6 05/25/2021 1456   PLT 290 05/25/2021 1456   MCV 91.6 05/25/2021 1456   MCH 30.3 05/25/2021 1456   MCHC 33.1 05/25/2021 1456   RDW 15.5 05/25/2021 1456   LYMPHSABS 3.1 05/25/2021 1456   MONOABS 0.5 05/25/2021 1456   EOSABS 0.1 05/25/2021 1456   BASOSABS 0.1 05/25/2021 1456    CMP     Component Value Date/Time   NA 138 05/30/2019 1234   K 2.9 (L) 05/30/2019 1234   CL 104 05/30/2019 1234   CO2 23 05/30/2019 1234   GLUCOSE 128 (H) 05/30/2019 1234   BUN 16 05/30/2019 1234   CREATININE 0.92 05/30/2019 1234   CALCIUM 9.1 05/30/2019 1234   PROT 8.0 05/30/2019 1234   ALBUMIN 4.1 05/30/2019 1234   AST 60 (H) 05/30/2019 1234   ALT 34 05/30/2019 1234   ALKPHOS 112 05/30/2019 1234   BILITOT 0.6 05/30/2019 1234   GFRNONAA >60 05/30/2019 1234   GFRAA >60 05/30/2019 1234     Assessment and Plan: No diagnosis found.   Iron deficiency anemia.- Justin Powers receives intermittent IV iron last given on 02/27/2021 and 03/06/2021.  Unclear etiology but patient does have history of previous hiatal hernia surgery.  More recently, patient complains of blood in his stool and hematuria.  Labs from 05/25/2021 show a ferritin of 171, hemoglobin 15.1 and iron saturations of 24%.  He does not need any additional IV iron today.  Recommend he return to clinic in 3 months with  repeat lab work, MD assessment and possible IV iron.  Hematuria- Unclear etiology.  He was started  on Flomax and given antibiotic by his PCP without resolution of his symptoms.  He did have a urinalysis completed on 05/25/2021 which does not show any blood in his urine.  We will repeat this today.  Patient may need referral to urology if this continues.  GI bleed- Reports this happens almost every bowel movement with a mixture of bright red and dark blood.  Denies any clotting.  Reports a history of a colonoscopy approximately 3 years ago and he was seen by Dr. Tobi Bastos.  Colonoscopy was on 11/08/2018 which showed nonbleeding internal hemorrhoids but otherwise unremarkable.  We will refer him back given he is seeing visible blood.  Follow Up Instructions: 3 months/labs and assessment  I spent 25 minutes dedicated to the care of this patient (face-to-face and non-face-to-face) on the date of the encounter to include what is described in the assessment and plan.  I discussed the assessment and treatment plan with the patient. The patient was provided an opportunity to ask questions and all were answered. The patient agreed with the plan and demonstrated an understanding of the instructions.   The patient was advised to call back or seek an in-person evaluation if the symptoms worsen or if the condition fails to improve as anticipated.   Mauro Kaufmann, NP 05/29/2021 3:08 PM

## 2021-05-31 LAB — URINE CULTURE: Culture: NO GROWTH

## 2021-07-24 ENCOUNTER — Other Ambulatory Visit: Payer: Self-pay

## 2021-07-27 ENCOUNTER — Ambulatory Visit: Payer: 59 | Admitting: Gastroenterology

## 2021-07-27 NOTE — Progress Notes (Deleted)
Justin Mood MD, MRCP(U.K) [address]  [address]  [place], [place] [address]  Main: [phone]  Fax: [phone]   Primary Care Physician: Franciso Bend, NP  Primary Gastroenterologist:  Dr. Wyline Powers   No chief complaint on file.   HPI: Justin Powers is a 30 y.o. male   Summary of history :   He was initially referred and seen in January 2020 for iron deficiency anemia by Dr. Cathie Hoops.  At that point he also had a bit of rectal bleeding.  H. pylori breath test was negative.  TTG antibody was positive.  Treated for an infectious diarrhea secondary to adenovirus.  He underwent an EGD in January 2020 which noted a large hiatal hernia and severe esophagitis.  Biopsies of the duodenum did not demonstrate any villous blunting.  A colonoscopy was also performed at that point of time with random colon biopsies were normal.  I referred him to [facility] to obtain repair of his hiatal hernia. Treated in March 2020 for IBS-D with Xifaxan which helped the diarrhea.     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    11/22/2019: Iron low at 29 percentage saturation 6.  Ferritin 13.5.  Hemoglobin 14.4 g.    Interval history 12/11/2018-07/27/2021  12/18/2020 was seen by [facility] where he underwent an upper endoscopy for preoperative assessment.  Initial surgery was in December 2021 with toupee repair.  Subsequently underwent an EGD:  LA grade B esophagitis was noted toupee fundoplication was found at the GE junction wrap appeared intact 4 cm hiatal hernia was found.04/23/2021 seen by [facility] bariatric surgery for recurrence of hiatal hernia. .  An upper GI series was performed in April 2022 with a large hiatal hernia plan was for robotic hiatal hernia repair likely  with mesh and with gastropexy.  Subsequently seen by hematology and iron studies were normal does not require any further iron.  At that time he mentioned that he had blood in his stools after every bowel movement and was referred back to see me.     Current Outpatient Medications  Medication Sig Dispense Refill   albuterol (VENTOLIN HFA) 108 (90 Base) MCG/ACT inhaler 2 PUFF(S) INHALATION EVERY FOUR HOURS AS NEEDED     atorvastatin (LIPITOR) 20 MG tablet Take 20 mg by mouth daily.     BREO ELLIPTA 100-25 MCG/INH AEPB 1 PUFF(S) INHALATION EVERY DAY (Patient not taking: No sig reported)     buPROPion (WELLBUTRIN SR) 200 MG 12 hr tablet Take 200 mg by mouth 2 (two) times daily. (Patient not taking: No sig reported)     CHANTIX CONTINUING MONTH PAK 1 MG tablet See admin instructions. (Patient not taking: No sig reported)  2   clonazePAM (KLONOPIN) 0.5 MG tablet Take 0.5 mg by mouth daily as needed.     cyclobenzaprine (FLEXERIL) 10 MG tablet Take 10 mg by mouth 3 (three) times daily as needed.     D3-50 1.25 MG (50000 UT) capsule Take 50,000 Units by mouth once a week. (Patient not taking: No sig reported)     hydrOXYzine (ATARAX/VISTARIL) 25 MG tablet every 4 (four) hours as needed.  (Patient not taking: No sig reported)     levofloxacin (LEVAQUIN) 500 MG tablet Take 500 mg by  mouth daily.     lisinopril (ZESTRIL) 10 MG tablet Take 10 mg by mouth daily.     LORazepam (ATIVAN) 1 MG tablet PLEASE SEE ATTACHED FOR DETAILED DIRECTIONS (Patient not taking: No sig reported)     metoprolol succinate (TOPROL-XL) 25 MG 24 hr tablet  (Patient not taking: No sig reported)     omeprazole (PRILOSEC) 40 MG capsule TAKE 1 CAPSULE BY MOUTH TWICE A DAY (INS PAYS 30/30DAYS) 30 capsule 11   ondansetron (ZOFRAN) 8 MG tablet Take by mouth.     propranolol (INDERAL) 40 MG tablet Take 40 mg by mouth 2 (two) times daily.     tamsulosin (FLOMAX) 0.4 MG CAPS capsule Take 0.4 mg by mouth daily. (Patient not taking:  Reported on 05/29/2021)     VRAYLAR capsule Take 1.5 mg by mouth daily. (Patient not taking: No sig reported)     No current facility-administered medications for this visit.    Allergies as of 07/27/2021   (No Known Allergies)    ROS:  General: Negative for anorexia, weight loss, fever, chills, fatigue, weakness. ENT: Negative for hoarseness, difficulty swallowing , nasal congestion. CV: Negative for chest pain, angina, palpitations, dyspnea on exertion, peripheral edema.  Respiratory: Negative for dyspnea at rest, dyspnea on exertion, cough, sputum, wheezing.  GI: See history of present illness. GU:  Negative for dysuria, hematuria, urinary incontinence, urinary frequency, nocturnal urination.  Endo: Negative for unusual weight change.    Physical Examination:   There were no vitals taken for this visit.  General: Well-nourished, well-developed in no acute distress.  Eyes: No icterus. Conjunctivae pink. Mouth: Oropharyngeal mucosa moist and pink , no lesions erythema or exudate. Lungs: Clear to auscultation bilaterally. Non-labored. Heart: Regular rate and rhythm, no murmurs rubs or gallops.  Abdomen: Bowel sounds are normal, nontender, nondistended, no hepatosplenomegaly or masses, no abdominal bruits or hernia , no rebound or guarding.   Extremities: No lower extremity edema. No clubbing or deformities. Neuro: Alert and oriented x 3.  Grossly intact. Skin: Warm and dry, no jaundice.   Psych: Alert and cooperative, normal Powers and affect.   Imaging Studies: No results found.  Assessment and Plan:   Justin Powers is a 30 y.o. y/o male with a history of erosive esophagitis secondary to a large hiatal hernia that was repaired initially in 2021 with recurrence of a large hiatal hernia subsequently.  Reevaluated by [facility] surgeons and plan is for a repeat surgery.  Followed hematology iron studies have normalized not on any IV iron at this point of time.  At his recent visit  complained of rectal bleeding..    Dr Justin Mood  MD,MRCP [facility]) Follow up in ***

## 2021-08-04 ENCOUNTER — Encounter: Payer: Self-pay | Admitting: Gastroenterology

## 2021-08-04 ENCOUNTER — Ambulatory Visit: Payer: 59 | Admitting: Gastroenterology

## 2021-08-04 ENCOUNTER — Other Ambulatory Visit: Payer: Self-pay

## 2021-08-04 NOTE — Progress Notes (Deleted)
[name] MD, MRCP(U.K) [address]  [address]  [place], [place] [address]  Main: [phone]  Fax: [phone]   Primary Care Physician: Franciso Bend, NP  Primary Gastroenterologist:  Dr. [name]   C/c:    HPI: Justin Powers is a 30 y.o. male  Summary of history :   He was initially referred and seen in January 2020 for iron deficiency anemia by Dr. Cathie Hoops.  At that point he also had a bit of rectal bleeding.  H. pylori breath test was negative.  TTG antibody was positive.  Treated for an infectious diarrhea secondary to adenovirus.  He underwent an EGD in January 2020 which noted a large hiatal hernia and severe esophagitis.  Biopsies of the duodenum did not demonstrate any villous blunting.  A colonoscopy was also performed at that point of time with random colon biopsies were normal.  I referred him to [facility] to obtain repair of his hiatal hernia. Treated in March 2020 for IBS-D with Xifaxan which helped the diarrhea.  Missed appointment at [facility] and has not scheduled it   Provided him the telephone number to call them and schedule it.   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   11/22/2019: Iron low at 29 percentage saturation 6.  Ferritin 13.5.  Hemoglobin 14.4 g.   Doing well no symptoms of acid reflux.  No issues with dysphagia.  Not yet been seen by [facility].    Interval history  12/12/2019-08/04/2021     Current Outpatient Medications  Medication Sig Dispense Refill   albuterol (VENTOLIN HFA) 108 (90 Base) MCG/ACT inhaler 2 PUFF(S) INHALATION EVERY FOUR HOURS AS NEEDED     atorvastatin (LIPITOR) 20 MG tablet Take 20 mg by mouth daily.     BREO ELLIPTA 100-25 MCG/INH AEPB 1 PUFF(S) INHALATION EVERY DAY (Patient not  taking: No sig reported)     buPROPion (WELLBUTRIN SR) 200 MG 12 hr tablet Take 200 mg by mouth 2 (two) times daily. (Patient not taking: No sig reported)     CHANTIX CONTINUING MONTH PAK 1 MG tablet See admin instructions. (Patient not taking: No sig reported)  2   clonazePAM (KLONOPIN) 0.5 MG tablet Take 0.5 mg by mouth daily as needed.     cyclobenzaprine (FLEXERIL) 10 MG tablet Take 10 mg by mouth 3 (three) times daily as needed.     D3-50 1.25 MG (50000 UT) capsule Take 50,000 Units by mouth once a week. (Patient not taking: No sig reported)     hydrOXYzine (ATARAX/VISTARIL) 25 MG tablet every 4 (four) hours as needed.  (Patient not taking: No sig reported)     levofloxacin (LEVAQUIN) 500 MG tablet Take 500 mg by mouth daily.     lisinopril (ZESTRIL) 10 MG tablet Take 10 mg by mouth daily.     LORazepam (ATIVAN) 1 MG tablet PLEASE SEE ATTACHED FOR DETAILED DIRECTIONS (Patient not taking: No sig reported)     metoprolol succinate (TOPROL-XL) 25 MG 24 hr tablet  (Patient not taking: No sig reported)     omeprazole (PRILOSEC) 40 MG capsule TAKE 1 CAPSULE BY MOUTH TWICE A DAY (INS PAYS 30/30DAYS) 30 capsule 11   ondansetron (ZOFRAN) 8 MG tablet Take by mouth.     propranolol (INDERAL)  40 MG tablet Take 40 mg by mouth 2 (two) times daily.     tamsulosin (FLOMAX) 0.4 MG CAPS capsule Take 0.4 mg by mouth daily. (Patient not taking: Reported on 05/29/2021)     VRAYLAR capsule Take 1.5 mg by mouth daily. (Patient not taking: No sig reported)     No current facility-administered medications for this visit.    Allergies as of 08/04/2021   (No Known Allergies)    ROS:  General: Negative for anorexia, weight loss, fever, chills, fatigue, weakness. ENT: Negative for hoarseness, difficulty swallowing , nasal congestion. CV: Negative for chest pain, angina, palpitations, dyspnea on exertion, peripheral edema.  Respiratory: Negative for dyspnea at rest, dyspnea on exertion, cough, sputum, wheezing.   GI: See history of present illness. GU:  Negative for dysuria, hematuria, urinary incontinence, urinary frequency, nocturnal urination.  Endo: Negative for unusual weight change.    Physical Examination:   There were no vitals taken for this visit.  General: Well-nourished, well-developed in no acute distress.  Eyes: No icterus. Conjunctivae pink. Mouth: Oropharyngeal mucosa moist and pink , no lesions erythema or exudate. Lungs: Clear to auscultation bilaterally. Non-labored. Heart: Regular rate and rhythm, no murmurs rubs or gallops.  Abdomen: Bowel sounds are normal, nontender, nondistended, no hepatosplenomegaly or masses, no abdominal bruits or hernia , no rebound or guarding.   Extremities: No lower extremity edema. No clubbing or deformities. Neuro: Alert and oriented x 3.  Grossly intact. Skin: Warm and dry, no jaundice.   Psych: Alert and cooperative, normal mood and affect.   Imaging Studies: No results found.  Assessment and Plan:   Justin Powers is a 30 y.o. y/o male here to follow up GERD with severe esophagitis.  Iron deficiency anemia likely secondary to severe acid reflux and esophagitis.  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  Presently is asymptomatic from acid reflux and dysphagia.  Strongly suggested him to follow-up at [facility] to have his hiatal hernia fixed as this would be the only long-term solution to avoid taking medications for many years to come as he is very young.       Plan  1. Continue taking Prilosec and Pepcid combination. 2. Strongly suggested to call at [facility] to get evaluated for repair of hiatal hernia and set up follow-up appointment telephone number provided yet again.    Dr [name]  MD,MRCP [facility]) Follow up in ***

## 2021-08-31 ENCOUNTER — Inpatient Hospital Stay (HOSPITAL_BASED_OUTPATIENT_CLINIC_OR_DEPARTMENT_OTHER): Payer: 59 | Admitting: Oncology

## 2021-08-31 ENCOUNTER — Inpatient Hospital Stay: Payer: 59

## 2021-08-31 ENCOUNTER — Other Ambulatory Visit: Payer: Self-pay

## 2021-08-31 ENCOUNTER — Inpatient Hospital Stay: Payer: 59 | Attending: Oncology

## 2021-08-31 ENCOUNTER — Encounter: Payer: Self-pay | Admitting: Oncology

## 2021-08-31 VITALS — BP 120/81 | HR 75 | Temp 98.2°F | Resp 18 | Wt 209.6 lb

## 2021-08-31 DIAGNOSIS — Z8052 Family history of malignant neoplasm of bladder: Secondary | ICD-10-CM | POA: Insufficient documentation

## 2021-08-31 DIAGNOSIS — D751 Secondary polycythemia: Secondary | ICD-10-CM | POA: Diagnosis not present

## 2021-08-31 DIAGNOSIS — Z803 Family history of malignant neoplasm of breast: Secondary | ICD-10-CM | POA: Diagnosis not present

## 2021-08-31 DIAGNOSIS — Z720 Tobacco use: Secondary | ICD-10-CM | POA: Diagnosis not present

## 2021-08-31 DIAGNOSIS — Z807 Family history of other malignant neoplasms of lymphoid, hematopoietic and related tissues: Secondary | ICD-10-CM | POA: Insufficient documentation

## 2021-08-31 DIAGNOSIS — Z808 Family history of malignant neoplasm of other organs or systems: Secondary | ICD-10-CM | POA: Insufficient documentation

## 2021-08-31 DIAGNOSIS — I10 Essential (primary) hypertension: Secondary | ICD-10-CM | POA: Insufficient documentation

## 2021-08-31 DIAGNOSIS — E611 Iron deficiency: Secondary | ICD-10-CM | POA: Diagnosis not present

## 2021-08-31 DIAGNOSIS — F17210 Nicotine dependence, cigarettes, uncomplicated: Secondary | ICD-10-CM | POA: Diagnosis not present

## 2021-08-31 DIAGNOSIS — D500 Iron deficiency anemia secondary to blood loss (chronic): Secondary | ICD-10-CM

## 2021-08-31 LAB — CBC WITH DIFFERENTIAL/PLATELET
Abs Immature Granulocytes: 0.02 10*3/uL (ref 0.00–0.07)
Basophils Absolute: 0 10*3/uL (ref 0.0–0.1)
Basophils Relative: 1 %
Eosinophils Absolute: 0.3 10*3/uL (ref 0.0–0.5)
Eosinophils Relative: 4 %
HCT: 51.5 % (ref 39.0–52.0)
Hemoglobin: 17.1 g/dL — ABNORMAL HIGH (ref 13.0–17.0)
Immature Granulocytes: 0 %
Lymphocytes Relative: 30 %
Lymphs Abs: 2.4 10*3/uL (ref 0.7–4.0)
MCH: 31.4 pg (ref 26.0–34.0)
MCHC: 33.2 g/dL (ref 30.0–36.0)
MCV: 94.5 fL (ref 80.0–100.0)
Monocytes Absolute: 0.5 10*3/uL (ref 0.1–1.0)
Monocytes Relative: 7 %
Neutro Abs: 4.7 10*3/uL (ref 1.7–7.7)
Neutrophils Relative %: 58 %
Platelets: 229 10*3/uL (ref 150–400)
RBC: 5.45 MIL/uL (ref 4.22–5.81)
RDW: 13.3 % (ref 11.5–15.5)
WBC: 7.9 10*3/uL (ref 4.0–10.5)
nRBC: 0 % (ref 0.0–0.2)

## 2021-08-31 LAB — FERRITIN: Ferritin: 43 ng/mL (ref 24–336)

## 2021-08-31 LAB — IRON AND TIBC
Iron: 54 ug/dL (ref 45–182)
Saturation Ratios: 10 % — ABNORMAL LOW (ref 17.9–39.5)
TIBC: 522 ug/dL — ABNORMAL HIGH (ref 250–450)
UIBC: 468 ug/dL

## 2021-09-05 ENCOUNTER — Encounter: Payer: Self-pay | Admitting: Oncology

## 2021-09-05 NOTE — Progress Notes (Signed)
Hematology/Oncology follow up note [phone] [phone]   Patient Care Team: Franciso Bend, NP as PCP - General (Nurse Practitioner) Rickard Patience, MD as Consulting Physician (Hematology and Oncology)  REFERRING PROVIDER: Franciso Bend, NP CHIEF COMPLAINTS/REASON FOR VISIT:  History of iron deficiency anemia, erythrocytosis.  HISTORY OF PRESENTING ILLNESS:  Justin Powers is a  30 y.o.  male with PMH listed below who was referred to me for evaluation of anemia Patient recently presented to ED after an episode of "upper chest tightness". Reports having multiple episodes for the past few months.  In ED, he had negative cardiology work up including negative EKG, troponin, hemoglobin was noted to be low at 10.6, microcytic.  He was sent home with presumed diagnosis of iron deficiency anemia and prescribed with iron supplements.  Also reports fatigue, progressively worsened lately.  He followed up with PCP and had additional work up done.  Reviewed patient's recent labs that was done at [facility] office. Labs revealed anemia with hemoglobin of 12, MCV 68, platelet count 378,000,  Differential showed increased basophils,  Iron panel showed saturation of 68%, iron 363, TIBC 537, normal TSH Reviewed patient's previous labs ordered by primary care physician's office, anemia is chronic onset , duration is since  No aggravating or improving factors.   #Previous gastroenterology work-up # EGD and colonoscopy on 11/08/2018 Non-bleeding internal hemorrhoids. - The examined portion of the ileum was normal. Biopsied. - The entire examined colon is normal. Biopsied. - The examination was otherwise normal. .A large hiatal hernia was present, LA Grade D (one or more mucosal breaks involving at least 75% of esophageal circumference) esophagitis with bleeding was found in the lower third of the esophagus. Normal examined duodenum. Biopsied.   EGD on 03/06/2019 which showed  large hiatal hernia, benign-appearing esophageal stenosis.  Dilated and biopsied.  Esophagitis.  Biopsy results showed stratified squamous epithelium with rare intraepithelial eosinophils and neutrophils, parakeratosis and a basal hyperplasia.  Negative for dysplasia and malignancy. #He had a repeat EGD on 7/14/2020Which showed large hiatal hernia, benign appearance esophageal stenosis.  Dilated.   INTERVAL HISTORY Justin Powers is a 29 y.o. male who has above history reviewed by me today presents for follow up visit for iron deficiency and erythrocytosis. Patient reports no new complaints.  Chronic fatigue has not changed.   Review of Systems  Constitutional:  Positive for fatigue. Negative for appetite change, chills, fever and unexpected weight change.  HENT:   Negative for hearing loss and voice change.   Eyes:  Negative for eye problems and icterus.  Respiratory:  Negative for chest tightness, cough and shortness of breath.   Cardiovascular:  Negative for chest pain and leg swelling.  Gastrointestinal:  Negative for abdominal distention, abdominal pain, blood in stool and diarrhea.  Endocrine: Negative for hot flashes.  Genitourinary:  Negative for difficulty urinating, dysuria and frequency.   Musculoskeletal:  Negative for arthralgias.  Skin:  Negative for itching and rash.  Neurological:  Negative for headaches, light-headedness and numbness.  Hematological:  Negative for adenopathy. Does not bruise/bleed easily.  Psychiatric/Behavioral:  Negative for confusion. The patient is not nervous/anxious.    MEDICAL HISTORY:  Past Medical History:  Diagnosis Date   Anemia    Hypertension     SURGICAL HISTORY: Past Surgical History:  Procedure Laterality Date   COLONOSCOPY WITH PROPOFOL N/A 11/08/2018   Procedure: COLONOSCOPY WITH PROPOFOL;  Surgeon: [name], MD;  Location[facility] END[facility]COPY;  Service: Gastroenterology;  Laterality: N/A;  ESOPHAGOGASTRODUODENOSCOPY (EGD) WITH  PROPOFOL N/A 11/08/2018   Procedure: ESOPHAGOGASTRODUODENOSCOPY (EGD) WITH PROPOFOL;  Surgeon: [name], MD;  Location: [facility] ENDOSCOPY;  Service: Gastroenterology;  Laterality: N/A;   ESOPHAGOGASTRODUODENOSCOPY (EGD) WITH PROPOFOL N/A 03/06/2019   Procedure: ESOPHAGOGASTRODUODENOSCOPY (EGD) WITH PROPOFOL with Dilation;  Surgeon: [name], MD;  Location: [facility] ENDOSCOPY;  Service: Gastroenterology;  Laterality: N/A;   ESOPHAGOGASTRODUODENOSCOPY (EGD) WITH PROPOFOL N/A 05/01/2019   Procedure: ESOPHAGOGASTRODUODENOSCOPY (EGD) WITH PROPOFOL with Dilation;  Surgeon: [name], MD;  Location: [facility] ENDOSCOPY;  Service: Gastroenterology;  Laterality: N/A;   ESOPHAGOGASTRODUODENOSCOPY (EGD) WITH PROPOFOL N/A 09/21/2019   Procedure: ESOPHAGOGASTRODUODENOSCOPY (EGD) WITH PROPOFOL with Dilation;  Surgeon: [name], MD;  Location: [facility] ENDOSCOPY;  Service: Gastroenterology;  Laterality: N/A;   wisdon tooth removal      SOCIAL HISTORY: Social History   Socioeconomic History   Marital status: Married    Spouse name: Not on file   Number of children: Not on file   Years of education: Not on file   Highest education level: Not on file  Occupational History   Not on file  Tobacco Use   Smoking status: Every Day    Packs/day: 1.00    Types: Cigarettes   Smokeless tobacco: Never  Vaping Use   Vaping Use: Never used  Substance and Sexual Activity   Alcohol use: Not Currently    Comment: Occassionally    Drug use: Never   Sexual activity: Not on file  Other Topics Concern   Not on file  Social History Narrative   Not on file   Social Determinants of Health   Financial Resource Strain: Not on file  Food Insecurity: Not on file  Transportation Needs: Not on file  Physical Activity: Not on file  Stress: Not on file  Social Connections: Not on file  Intimate Partner Violence: Not on file    FAMILY HISTORY: Family History  Problem Relation Age of Onset   Crohn's disease Mother     Hypertension Mother    Breast cancer Maternal Aunt    Bladder Cancer Maternal Grandfather    Cervical cancer Other    Non-Hodgkin's lymphoma Other     ALLERGIES:  has No Known Allergies.  MEDICATIONS:  Current Outpatient Medications  Medication Sig Dispense Refill   clonazePAM (KLONOPIN) 0.5 MG tablet Take 0.5 mg by mouth daily as needed.     lisinopril (ZESTRIL) 20 MG tablet Take 20 mg by mouth daily.     propranolol (INDERAL) 40 MG tablet Take 40 mg by mouth 2 (two) times daily.     albuterol (VENTOLIN HFA) 108 (90 Base) MCG/ACT inhaler 2 PUFF(S) INHALATION EVERY FOUR HOURS AS NEEDED (Patient not taking: Reported on 08/31/2021)     atorvastatin (LIPITOR) 20 MG tablet Take 20 mg by mouth daily. (Patient not taking: Reported on 08/31/2021)     BREO ELLIPTA 100-25 MCG/INH AEPB 1 PUFF(S) INHALATION EVERY DAY (Patient not taking: Reported on 08/31/2021)     buPROPion (WELLBUTRIN SR) 200 MG 12 hr tablet Take 200 mg by mouth 2 (two) times daily. (Patient not taking: Reported on 08/31/2021)     CHANTIX CONTINUING MONTH PAK 1 MG tablet See admin instructions. (Patient not taking: Reported on 08/31/2021)  2   cyclobenzaprine (FLEXERIL) 10 MG tablet Take 10 mg by mouth 3 (three) times daily as needed. (Patient not taking: Reported on 08/31/2021)     D3-50 1.25 MG (50000 UT) capsule Take 50,000 Units by mouth once a week. (Patient not taking: Reported on  08/31/2021)     hydrOXYzine (ATARAX/VISTARIL) 25 MG tablet every 4 (four) hours as needed.  (Patient not taking: No sig reported)     levofloxacin (LEVAQUIN) 500 MG tablet Take 500 mg by mouth daily. (Patient not taking: Reported on 08/31/2021)     LORazepam (ATIVAN) 1 MG tablet PLEASE SEE ATTACHED FOR DETAILED DIRECTIONS (Patient not taking: No sig reported)     metoprolol succinate (TOPROL-XL) 25 MG 24 hr tablet  (Patient not taking: No sig reported)     omeprazole (PRILOSEC) 40 MG capsule TAKE 1 CAPSULE BY MOUTH TWICE A DAY (INS PAYS 30/30DAYS)  (Patient not taking: Reported on 08/31/2021) 30 capsule 11   ondansetron (ZOFRAN) 8 MG tablet Take by mouth. (Patient not taking: Reported on 08/31/2021)     tamsulosin (FLOMAX) 0.4 MG CAPS capsule Take 0.4 mg by mouth daily. (Patient not taking: No sig reported)     VRAYLAR capsule Take 1.5 mg by mouth daily. (Patient not taking: No sig reported)     No current facility-administered medications for this visit.     PHYSICAL EXAMINATION: ECOG PERFORMANCE STATUS: 0 - Asymptomatic Vitals:   08/31/21 1414  BP: 120/81  Pulse: 75  Resp: 18  Temp: 98.2 F (36.8 C)   Filed Weights   08/31/21 1414  Weight: 209 lb 9.6 oz (95.1 kg)    Physical Exam Constitutional:      General: He is not in acute distress.    Appearance: He is obese.  HENT:     Head: Normocephalic and atraumatic.  Eyes:     General: No scleral icterus.    Pupils: Pupils are equal, round, and reactive to light.  Cardiovascular:     Rate and Rhythm: Normal rate and regular rhythm.     Heart sounds: Normal heart sounds.  Pulmonary:     Effort: Pulmonary effort is normal. No respiratory distress.     Breath sounds: No wheezing.  Abdominal:     General: Bowel sounds are normal. There is no distension.     Palpations: Abdomen is soft. There is no mass.     Tenderness: There is no abdominal tenderness.  Musculoskeletal:        General: No deformity. Normal range of motion.     Cervical back: Normal range of motion and neck supple.  Skin:    General: Skin is warm and dry.     Coloration: Skin is not pale.     Findings: No erythema or rash.  Neurological:     Mental Status: He is alert and oriented to person, place, and time.     Cranial Nerves: No cranial nerve deficit.     Coordination: Coordination normal.  Psychiatric:        Behavior: Behavior normal.        Thought Content: Thought content normal.     LABORATORY DATA:  I have reviewed the data as listed Lab Results  Component Value Date   WBC 7.9  08/31/2021   HGB 17.1 (H) 08/31/2021   HCT 51.5 08/31/2021   MCV 94.5 08/31/2021   PLT 229 08/31/2021   No results for input(s): NA, K, CL, CO2, GLUCOSE, BUN, CREATININE, CALCIUM, GFRNONAA, GFRAA, PROT, ALBUMIN, AST, ALT, ALKPHOS, BILITOT, BILIDIR, IBILI in the last 8760 hours. Iron/TIBC/Ferritin/ %Sat    Component Value Date/Time   IRON 54 08/31/2021 1358   TIBC 522 (H) 08/31/2021 1358   FERRITIN 43 08/31/2021 1358   IRONPCTSAT 10 (L) 08/31/2021 1358  ASSESSMENT & PLAN:  1. Iron deficiency   2. Erythrocytosis   3. Tobacco use    #Iron deficiency without anemia. Labs reviewed and discussed with patient. Hemoglobin is normal.  Iron panel is consistent with iron deficiency.  #Patient has developed erythrocytosis, hemoglobin 17.1.  Hematocrit 51.2.  I will hold off iron supplementation/IV iron at this point.  Erythrocytosis likely secondary to smoking.    #Tobacco use,Smoking cessation was discussed. All questions were answered. The patient knows to call the clinic with any problems questions or concerns.  Follow up 6 months.  Earlie Server, MD, PhD 09/05/2021

## 2022-03-01 ENCOUNTER — Inpatient Hospital Stay: Payer: 59 | Attending: Oncology

## 2022-03-01 DIAGNOSIS — D751 Secondary polycythemia: Secondary | ICD-10-CM | POA: Diagnosis not present

## 2022-03-01 DIAGNOSIS — R7401 Elevation of levels of liver transaminase levels: Secondary | ICD-10-CM | POA: Insufficient documentation

## 2022-03-01 DIAGNOSIS — I10 Essential (primary) hypertension: Secondary | ICD-10-CM | POA: Diagnosis not present

## 2022-03-01 DIAGNOSIS — K219 Gastro-esophageal reflux disease without esophagitis: Secondary | ICD-10-CM | POA: Diagnosis not present

## 2022-03-01 DIAGNOSIS — Z8052 Family history of malignant neoplasm of bladder: Secondary | ICD-10-CM | POA: Insufficient documentation

## 2022-03-01 DIAGNOSIS — Z803 Family history of malignant neoplasm of breast: Secondary | ICD-10-CM | POA: Insufficient documentation

## 2022-03-01 DIAGNOSIS — F17210 Nicotine dependence, cigarettes, uncomplicated: Secondary | ICD-10-CM | POA: Diagnosis not present

## 2022-03-01 DIAGNOSIS — D509 Iron deficiency anemia, unspecified: Secondary | ICD-10-CM | POA: Insufficient documentation

## 2022-03-01 DIAGNOSIS — E611 Iron deficiency: Secondary | ICD-10-CM

## 2022-03-01 LAB — COMPREHENSIVE METABOLIC PANEL
ALT: 80 U/L — ABNORMAL HIGH (ref 0–44)
AST: 279 U/L — ABNORMAL HIGH (ref 15–41)
Albumin: 3.7 g/dL (ref 3.5–5.0)
Alkaline Phosphatase: 88 U/L (ref 38–126)
Anion gap: 8 (ref 5–15)
BUN: 9 mg/dL (ref 6–20)
CO2: 27 mmol/L (ref 22–32)
Calcium: 9.3 mg/dL (ref 8.9–10.3)
Chloride: 98 mmol/L (ref 98–111)
Creatinine, Ser: 0.86 mg/dL (ref 0.61–1.24)
GFR, Estimated: >60 mL/min (ref >60)
Glucose, Bld: 101 mg/dL — ABNORMAL HIGH (ref 70–99)
Potassium: 3.7 mmol/L (ref 3.5–5.1)
Sodium: 133 mmol/L — ABNORMAL LOW (ref 135–145)
Total Bilirubin: 0.9 mg/dL (ref 0.3–1.2)
Total Protein: 8.1 g/dL (ref 6.5–8.1)

## 2022-03-01 LAB — CBC WITH DIFFERENTIAL/PLATELET
Abs Immature Granulocytes: 0.07 10*3/uL (ref 0.00–0.07)
Basophils Absolute: 0.1 10*3/uL (ref 0.0–0.1)
Basophils Relative: 1 %
Eosinophils Absolute: 0.1 10*3/uL (ref 0.0–0.5)
Eosinophils Relative: 1 %
HCT: 45.8 % (ref 39.0–52.0)
Hemoglobin: 15.8 g/dL (ref 13.0–17.0)
Immature Granulocytes: 1 %
Lymphocytes Relative: 23 %
Lymphs Abs: 2.4 10*3/uL (ref 0.7–4.0)
MCH: 31.9 pg (ref 26.0–34.0)
MCHC: 34.5 g/dL (ref 30.0–36.0)
MCV: 92.5 fL (ref 80.0–100.0)
Monocytes Absolute: 0.7 10*3/uL (ref 0.1–1.0)
Monocytes Relative: 6 %
Neutro Abs: 7.3 10*3/uL (ref 1.7–7.7)
Neutrophils Relative %: 68 %
Platelets: 219 10*3/uL (ref 150–400)
RBC: 4.95 MIL/uL (ref 4.22–5.81)
RDW: 13.4 % (ref 11.5–15.5)
WBC: 10.6 10*3/uL — ABNORMAL HIGH (ref 4.0–10.5)
nRBC: 0 % (ref 0.0–0.2)

## 2022-03-01 LAB — IRON AND TIBC
Iron: 240 ug/dL — ABNORMAL HIGH (ref 45–182)
Saturation Ratios: 62 % — ABNORMAL HIGH (ref 17.9–39.5)
TIBC: 386 ug/dL (ref 250–450)
UIBC: 146 ug/dL

## 2022-03-01 LAB — FERRITIN: Ferritin: 351 ng/mL — ABNORMAL HIGH (ref 24–336)

## 2022-03-03 ENCOUNTER — Inpatient Hospital Stay: Payer: 59

## 2022-03-03 ENCOUNTER — Inpatient Hospital Stay (HOSPITAL_BASED_OUTPATIENT_CLINIC_OR_DEPARTMENT_OTHER): Payer: 59 | Admitting: Oncology

## 2022-03-03 ENCOUNTER — Encounter: Payer: Self-pay | Admitting: Oncology

## 2022-03-03 VITALS — BP 114/86 | HR 87 | Temp 96.6°F | Resp 18 | Wt 211.0 lb

## 2022-03-03 DIAGNOSIS — D751 Secondary polycythemia: Secondary | ICD-10-CM | POA: Diagnosis not present

## 2022-03-03 DIAGNOSIS — Z789 Other specified health status: Secondary | ICD-10-CM

## 2022-03-03 DIAGNOSIS — R7401 Elevation of levels of liver transaminase levels: Secondary | ICD-10-CM | POA: Diagnosis not present

## 2022-03-03 DIAGNOSIS — E611 Iron deficiency: Secondary | ICD-10-CM | POA: Diagnosis not present

## 2022-03-03 DIAGNOSIS — Z720 Tobacco use: Secondary | ICD-10-CM

## 2022-03-03 DIAGNOSIS — K219 Gastro-esophageal reflux disease without esophagitis: Secondary | ICD-10-CM

## 2022-03-03 LAB — HEPATITIS PANEL, ACUTE
HCV Ab: NONREACTIVE
Hep A IgM: NONREACTIVE
Hep B C IgM: NONREACTIVE
Hepatitis B Surface Ag: NONREACTIVE

## 2022-03-03 NOTE — Progress Notes (Signed)
?Hematology/Oncology follow up note ?[phone] [phone] ? ? ?Patient Care Team: ?Gae Bon, NP as PCP - General (Nurse Practitioner) ?Earlie Server, MD as Consulting Physician (Hematology and Oncology) ? ?REFERRING PROVIDER: ?Gae Bon, NP ?CHIEF COMPLAINTS/REASON FOR VISIT:  ?History of iron deficiency anemia, erythrocytosis. ? ?HISTORY OF PRESENTING ILLNESS:  ?Justin Powers is a  31 y.o.  male with PMH listed below who was referred to me for evaluation of anemia ?Patient recently presented to ED after an episode of "upper chest tightness". Reports having multiple episodes for the past few months.  ?In ED, he had negative cardiology work up including negative EKG, troponin, hemoglobin was noted to be low at 10.6, microcytic.  ?He was sent home with presumed diagnosis of iron deficiency anemia and prescribed with iron supplements.  ?Also reports fatigue, progressively worsened lately. ? ?He followed up with PCP and had additional work up done.  ?Reviewed patient's recent labs that was done at [facility] office. Labs revealed anemia with hemoglobin of 12, MCV 68, platelet count 378,000,  ?Differential showed increased basophils,  ?Iron panel showed saturation of 68%, iron 363, TIBC 537, normal TSH ?Reviewed patient's previous labs ordered by primary care physician's office, anemia is chronic onset , duration is since  ?No aggravating or improving factors. ? ? ?#Previous gastroenterology work-up ?# EGD and colonoscopy on 11/08/2018 ?Non-bleeding internal hemorrhoids. ?- The examined portion of the ileum was normal. Biopsied. ?- The entire examined colon is normal. Biopsied. ?- The examination was otherwise normal. ?.A large hiatal hernia was present, LA Grade D (one or more mucosal breaks involving at least 75% of esophageal circumference) esophagitis with bleeding was found ?in the lower third of the esophagus. Normal examined duodenum. Biopsied. ? ? EGD on 03/06/2019 which showed  large hiatal hernia, benign-appearing esophageal stenosis.  Dilated and biopsied.  Esophagitis.  Biopsy results showed stratified squamous epithelium with rare intraepithelial eosinophils and neutrophils, parakeratosis and a basal hyperplasia.  Negative for dysplasia and malignancy. ?#He had a repeat EGD on 7/14/2020Which showed large hiatal hernia, benign appearance esophageal stenosis.  Dilated. ? ? ?INTERVAL HISTORY ?Justin Powers is a 31 y.o. male who has above history reviewed by me today presents for follow up visit for iron deficiency and erythrocytosis. ?Patient continues to drink alcohol.  Recently he has had multiple "threw up" episodes. ? ? ?Review of Systems  ?Constitutional:  Positive for fatigue. Negative for appetite change, chills, fever and unexpected weight change.  ?HENT:   Negative for hearing loss and voice change.   ?Eyes:  Negative for eye problems and icterus.  ?Respiratory:  Negative for chest tightness, cough and shortness of breath.   ?Cardiovascular:  Negative for chest pain and leg swelling.  ?Gastrointestinal:  Negative for abdominal distention, abdominal pain, blood in stool and diarrhea.  ?Endocrine: Negative for hot flashes.  ?Genitourinary:  Negative for difficulty urinating, dysuria and frequency.   ?Musculoskeletal:  Negative for arthralgias.  ?Skin:  Negative for itching and rash.  ?Neurological:  Negative for headaches, light-headedness and numbness.  ?Hematological:  Negative for adenopathy. Does not bruise/bleed easily.  ?Psychiatric/Behavioral:  Negative for confusion. The patient is not nervous/anxious.   ? ?MEDICAL HISTORY:  ?Past Medical History:  ?Diagnosis Date  ? Anemia   ? Hypertension   ? ? ?SURGICAL HISTORY: ?Past Surgical History:  ?Procedure Laterality Date  ? COLONOSCOPY WITH PROPOFOL N/A 11/08/2018  ? Procedure: COLONOSCOPY WITH PROPOFOL;  Surgeon: Jonathon Bellows, MD;  Location: [facility] ENDOSCOPY;  Service: Gastroenterology;  Laterality:  N/A;  ?  ESOPHAGOGASTRODUODENOSCOPY (EGD) WITH PROPOFOL N/A 11/08/2018  ? Procedure: ESOPHAGOGASTRODUODENOSCOPY (EGD) WITH PROPOFOL;  Surgeon: Jonathon Bellows, MD;  Location: [facility] ENDOSCOPY;  Service: Gastroenterology;  Laterality: N/A;  ? ESOPHAGOGASTRODUODENOSCOPY (EGD) WITH PROPOFOL N/A 03/06/2019  ? Procedure: ESOPHAGOGASTRODUODENOSCOPY (EGD) WITH PROPOFOL with Dilation;  Surgeon: Jonathon Bellows, MD;  Location: [facility] ENDOSCOPY;  Service: Gastroenterology;  Laterality: N/A;  ? ESOPHAGOGASTRODUODENOSCOPY (EGD) WITH PROPOFOL N/A 05/01/2019  ? Procedure: ESOPHAGOGASTRODUODENOSCOPY (EGD) WITH PROPOFOL with Dilation;  Surgeon: Jonathon Bellows, MD;  Location: [facility] ENDOSCOPY;  Service: Gastroenterology;  Laterality: N/A;  ? ESOPHAGOGASTRODUODENOSCOPY (EGD) WITH PROPOFOL N/A 09/21/2019  ? Procedure: ESOPHAGOGASTRODUODENOSCOPY (EGD) WITH PROPOFOL with Dilation;  Surgeon: Jonathon Bellows, MD;  Location: [facility] ENDOSCOPY;  Service: Gastroenterology;  Laterality: N/A;  ? wisdon tooth removal    ? ? ?SOCIAL HISTORY: ?Social History  ? ?Socioeconomic History  ? Marital status: Married  ?  Spouse name: Not on file  ? Number of children: Not on file  ? Years of education: Not on file  ? Highest education level: Not on file  ?Occupational History  ? Not on file  ?Tobacco Use  ? Smoking status: Every Day  ?  Packs/day: 1.00  ?  Types: Cigarettes  ? Smokeless tobacco: Never  ?Vaping Use  ? Vaping Use: Never used  ?Substance and Sexual Activity  ? Alcohol use: Not Currently  ?  Comment: Occassionally   ? Drug use: Never  ? Sexual activity: Not on file  ?Other Topics Concern  ? Not on file  ?Social History Narrative  ? Not on file  ? ?Social Determinants of Health  ? ?Financial Resource Strain: Not on file  ?Food Insecurity: Not on file  ?Transportation Needs: Not on file  ?Physical Activity: Not on file  ?Stress: Not on file  ?Social Connections: Not on file  ?Intimate Partner Violence: Not on file  ? ? ?FAMILY HISTORY: ?Family History  ?Problem Relation Age of  Onset  ? Crohn's disease Mother   ? Hypertension Mother   ? Breast cancer Maternal Aunt   ? Bladder Cancer Maternal Grandfather   ? Cervical cancer Other   ? Non-Hodgkin's lymphoma Other   ? ? ?ALLERGIES:  has No Known Allergies. ? ?MEDICATIONS:  ?Current Outpatient Medications  ?Medication Sig Dispense Refill  ? clonazePAM (KLONOPIN) 0.5 MG tablet Take 0.5 mg by mouth daily as needed.    ? lisinopril (ZESTRIL) 20 MG tablet Take 20 mg by mouth daily.    ? omeprazole (PRILOSEC) 40 MG capsule TAKE 1 CAPSULE BY MOUTH TWICE A DAY (INS PAYS 30/30DAYS) 30 capsule 11  ? propranolol (INDERAL) 40 MG tablet Take 40 mg by mouth 2 (two) times daily.    ? albuterol (VENTOLIN HFA) 108 (90 Base) MCG/ACT inhaler 2 PUFF(S) INHALATION EVERY FOUR HOURS AS NEEDED (Patient not taking: Reported on 08/31/2021)    ? atorvastatin (LIPITOR) 20 MG tablet Take 20 mg by mouth daily. (Patient not taking: Reported on 08/31/2021)    ? BREO ELLIPTA 100-25 MCG/INH AEPB 1 PUFF(S) INHALATION EVERY DAY (Patient not taking: Reported on 08/31/2021)    ? buPROPion (WELLBUTRIN SR) 200 MG 12 hr tablet Take 200 mg by mouth 2 (two) times daily. (Patient not taking: Reported on 08/31/2021)    ? CHANTIX CONTINUING MONTH PAK 1 MG tablet See admin instructions. (Patient not taking: Reported on 08/31/2021)  2  ? cyclobenzaprine (FLEXERIL) 10 MG tablet Take 10 mg by mouth 3 (three) times daily as needed. (Patient not taking: Reported on  08/31/2021)    ? D3-50 1.25 MG (50000 UT) capsule Take 50,000 Units by mouth once a week. (Patient not taking: Reported on 08/31/2021)    ? hydrOXYzine (ATARAX/VISTARIL) 25 MG tablet every 4 (four) hours as needed.  (Patient not taking: Reported on 06/27/2020)    ? levofloxacin (LEVAQUIN) 500 MG tablet Take 500 mg by mouth daily. (Patient not taking: Reported on 08/31/2021)    ? LORazepam (ATIVAN) 1 MG tablet PLEASE SEE ATTACHED FOR DETAILED DIRECTIONS (Patient not taking: No sig reported)    ? metoprolol succinate (TOPROL-XL) 25 MG  24 hr tablet  (Patient not taking: Reported on 06/27/2020)    ? ondansetron (ZOFRAN) 8 MG tablet Take by mouth. (Patient not taking: Reported on 08/31/2021)    ? tamsulosin (FLOMAX) 0.4 MG CAPS capsule Take 0.4 mg by mouth daily.

## 2022-03-03 NOTE — Progress Notes (Signed)
Pt here for follow up. No new concerns voiced.   

## 2022-03-04 ENCOUNTER — Ambulatory Visit: Payer: 59 | Admitting: Gastroenterology

## 2022-03-04 ENCOUNTER — Encounter: Payer: Self-pay | Admitting: Gastroenterology

## 2022-03-04 VITALS — BP 121/82 | HR 82 | Temp 98.8°F | Ht 67.0 in | Wt 209.8 lb

## 2022-03-04 DIAGNOSIS — K449 Diaphragmatic hernia without obstruction or gangrene: Secondary | ICD-10-CM | POA: Diagnosis not present

## 2022-03-04 DIAGNOSIS — F1010 Alcohol abuse, uncomplicated: Secondary | ICD-10-CM

## 2022-03-04 DIAGNOSIS — R7989 Other specified abnormal findings of blood chemistry: Secondary | ICD-10-CM

## 2022-03-04 DIAGNOSIS — K2210 Ulcer of esophagus without bleeding: Secondary | ICD-10-CM

## 2022-03-04 NOTE — Patient Instructions (Signed)
ULTRASDOUND SCHEDULED FOR Mar 16 2022 AT 8:00AM AT [facility] NPO AFTER MIDNIGHT THE NIGHT BEFORE

## 2022-03-04 NOTE — Progress Notes (Signed)
[name] MD, MRCP(U.K) [address]  [address]  [place], [place] [address]  Main: [phone]  Fax: [phone]   Primary Care Physician: Franciso Bend, NP  Primary Gastroenterologist:  Dr. [name]   Chief Complaint  Patient presents with   Follow-up    HPI: Justin Powers is a 31 y.o. male   Summary of history :    He was initially referred and seen in January 2020 for iron deficiency anemia by Dr. Cathie Hoops.  At that point he also had a bit of rectal bleeding.  H. pylori breath test was negative.  TTG antibody was positive.  Treated for an infectious diarrhea secondary to adenovirus.  He underwent an EGD in January 2020 which noted a large hiatal hernia and severe esophagitis.  Biopsies of the duodenum did not demonstrate any villous blunting.  A colonoscopy was also performed at that point of time with random colon biopsies were normal.  I referred him to [facility] to obtain repair of his hiatal hernia. Treated in March 2020 for IBS-D with Xifaxan which helped the diarrhea.  Missed appointment at [facility] and has not scheduled it   Provided him the telephone number to call them and schedule it.   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   11/22/2019: Iron low at 29 percentage saturation 6.  Ferritin 13.5.  Hemoglobin 14.4 g.    Interval history 12/11/2018-03/04/2022  12/18/2020 : EGD: 4:00/may be a elliptical LA grade B esophagitis 4 cm hiatal hernia toupee fundoplication performed.    He states that he was advised to repeat surgery and he is yet to schedule it with them. Was recommended to quit smoking and stop alcohol intake He was seen by Dr. Cathie Hoops yesterday for iron deficiency.  Was found to have elevated LFTs.  He says he drinks 2  bottles of whiskey a day.  Denies any illegal drug use.  History of tattoos.  He admits he is an alcoholic.  Tried alcoholic Anonymous but failed.  No new medications.  No incarceration.     Current Outpatient Medications  Medication Sig Dispense Refill   albuterol (VENTOLIN HFA) 108 (90 Base) MCG/ACT inhaler      clonazePAM (KLONOPIN) 0.5 MG tablet Take 0.5 mg by mouth daily as needed.     omeprazole (PRILOSEC) 40 MG capsule TAKE 1 CAPSULE BY MOUTH TWICE A DAY (INS PAYS 30/30DAYS) 30 capsule 11   ondansetron (ZOFRAN) 8 MG tablet Take by mouth.     propranolol (INDERAL) 40 MG tablet Take 40 mg by mouth 2 (two) times daily.     atorvastatin (LIPITOR) 20 MG tablet Take 20 mg by mouth daily. (Patient not taking: Reported on 08/31/2021)     BREO ELLIPTA 100-25 MCG/INH AEPB 1 PUFF(S) INHALATION EVERY DAY (Patient not taking: Reported on 08/31/2021)     buPROPion (WELLBUTRIN SR) 200 MG 12 hr tablet Take 200 mg by mouth 2 (two) times daily. (Patient not taking: Reported on 08/31/2021)     CHANTIX CONTINUING MONTH PAK 1 MG tablet See admin instructions. (Patient not taking: Reported on 08/31/2021)  2   cyclobenzaprine (FLEXERIL) 10 MG tablet Take 10 mg by mouth 3 (three) times daily as needed. (Patient not taking: Reported on 08/31/2021)  D3-50 1.25 MG (50000 UT) capsule Take 50,000 Units by mouth once a week. (Patient not taking: Reported on 08/31/2021)     hydrOXYzine (ATARAX/VISTARIL) 25 MG tablet every 4 (four) hours as needed.  (Patient not taking: Reported on 06/27/2020)     levofloxacin (LEVAQUIN) 500 MG tablet Take 500 mg by mouth daily. (Patient not taking: Reported on 08/31/2021)     lisinopril (ZESTRIL) 20 MG tablet Take 20 mg by mouth daily. (Patient not taking: Reported on 03/04/2022)     LORazepam (ATIVAN) 1 MG tablet PLEASE SEE ATTACHED FOR DETAILED DIRECTIONS (Patient not taking: No sig reported)     metoprolol succinate (TOPROL-XL) 25 MG 24 hr tablet  (Patient not taking: Reported on  06/27/2020)     tamsulosin (FLOMAX) 0.4 MG CAPS capsule Take 0.4 mg by mouth daily. (Patient not taking: Reported on 05/29/2021)     VRAYLAR capsule Take 1.5 mg by mouth daily. (Patient not taking: Reported on 06/27/2020)     No current facility-administered medications for this visit.    Allergies as of 03/04/2022   (No Known Allergies)    ROS:  General: Negative for anorexia, weight loss, fever, chills, fatigue, weakness. ENT: Negative for hoarseness, difficulty swallowing , nasal congestion. CV: Negative for chest pain, angina, palpitations, dyspnea on exertion, peripheral edema.  Respiratory: Negative for dyspnea at rest, dyspnea on exertion, cough, sputum, wheezing.  GI: See history of present illness. GU:  Negative for dysuria, hematuria, urinary incontinence, urinary frequency, nocturnal urination.  Endo: Negative for unusual weight change.    Physical Examination:   BP 121/82   Pulse 82   Temp 98.8 F (37.1 C) (Oral)   Ht 5\' 7"  (1.702 m)   Wt 209 lb 12.8 oz (95.2 kg)   BMI 32.86 kg/m   General: Well-nourished, well-developed in no acute distress.  Eyes: No icterus. Conjunctivae pink. Mouth: Oropharyngeal mucosa moist and pink , no lesions erythema or exudate. Neuro: Alert and oriented x 3.  Grossly intact. Skin: Warm and dry, no jaundice.   Psych: Alert and cooperative, normal mood and affect.   Imaging Studies: No results found.  Assessment and Plan:   Justin Powers is a 31 y.o. y/o male with a longstanding history of GERD and severe esophagitis.  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  He underwent toupee surgery at [facility] which has failed and was recommended repeat surgery which she is thinking about.  In the interim was found to have elevated transaminases.  He does admit to drinking large quantities of alcohol approximately 2 bottles of whiskey a day.  He admits that he is an alcoholic.  Very likely abnormal LFTs due to excess  alcohol consumption.  I have explained to him that he must stop drinking alcohol otherwise he will die.  I will screen him for other conditions which can cause abnormal LFTs but I am reasonably certain that majority of his LFT elevation is due to alcohol consumption.  From his reflux point of view he should continue his therapy and follow-up with [facility] to discuss further about surgery once he stops drinking alcohol.I will also obtain a right upper quadrant ultrasound to evaluate the liver I will try and provide him some information about resources to quit alcohol,   Dr [facility]  MD,MRCP [facility]) Follow up in 4 weeks

## 2022-03-08 NOTE — Progress Notes (Signed)
Needs Hep A/B vaccine

## 2022-03-10 LAB — CK: Total CK: 58 U/L (ref 49–439)

## 2022-03-10 LAB — HEPATITIS C ANTIBODY: Hep C Virus Ab: NONREACTIVE

## 2022-03-10 LAB — IMMUNOGLOBULINS A/E/G/M, SERUM
IgA/Immunoglobulin A, Serum: 407 mg/dL — ABNORMAL HIGH (ref 90–386)
IgE (Immunoglobulin E), Serum: 628 IU/mL — ABNORMAL HIGH (ref 6–495)
IgG (Immunoglobin G), Serum: 1699 mg/dL — ABNORMAL HIGH (ref 603–1613)
IgM (Immunoglobulin M), Srm: 125 mg/dL (ref 20–172)

## 2022-03-10 LAB — HEPATITIS A ANTIBODY, TOTAL: hep A Total Ab: NEGATIVE

## 2022-03-10 LAB — IRON,TIBC AND FERRITIN PANEL
Ferritin: 414 ng/mL — ABNORMAL HIGH (ref 30–400)
Iron Saturation: 80 % (ref 15–55)
Iron: 285 ug/dL (ref 38–169)
Total Iron Binding Capacity: 356 ug/dL (ref 250–450)
UIBC: 71 ug/dL — ABNORMAL LOW (ref 111–343)

## 2022-03-10 LAB — HIV ANTIBODY (ROUTINE TESTING W REFLEX): HIV Screen 4th Generation wRfx: NONREACTIVE

## 2022-03-10 LAB — HEPATITIS B E ANTIBODY: Hep B E Ab: NEGATIVE

## 2022-03-10 LAB — CELIAC DISEASE AB SCREEN W/RFX
Antigliadin Abs, IgA: 13 units (ref 0–19)
Transglutaminase IgA: <2 U/mL (ref 0–3)

## 2022-03-10 LAB — CERULOPLASMIN: Ceruloplasmin: 20 mg/dL (ref 16.0–31.0)

## 2022-03-10 LAB — HEPATITIS B SURFACE ANTIBODY,QUALITATIVE: Hep B Surface Ab, Qual: NONREACTIVE

## 2022-03-10 LAB — HEPATITIS B SURFACE ANTIGEN: Hepatitis B Surface Ag: NEGATIVE

## 2022-03-10 LAB — ALPHA-1-ANTITRYPSIN: A-1 Antitrypsin: 146 mg/dL (ref 95–164)

## 2022-03-10 LAB — MITOCHONDRIAL/SMOOTH MUSCLE AB PNL
Mitochondrial Ab: <20 Units (ref 0.0–20.0)
Smooth Muscle Ab: 43 Units — ABNORMAL HIGH (ref 0–19)

## 2022-03-10 LAB — HEMOCHROMATOSIS DNA-PCR(C282Y,H63D)

## 2022-03-10 LAB — HEPATITIS B E ANTIGEN: Hep B E Ag: NEGATIVE

## 2022-03-10 LAB — ANTI-MICROSOMAL ANTIBODY LIVER / KIDNEY: LKM1 Ab: 3.4 Units (ref 0.0–20.0)

## 2022-03-10 LAB — HEPATITIS B CORE ANTIBODY, TOTAL: Hep B Core Total Ab: NEGATIVE

## 2022-03-10 LAB — ANA: Anti Nuclear Antibody (ANA): NEGATIVE

## 2022-03-11 NOTE — Telephone Encounter (Signed)
Hep A/ B antibody negative means you do not have any antibodies to protect you against the infection and once you get the vaccination your antibodies will turn positive and reduce your risks of getting sick from Hep A/B

## 2022-03-16 ENCOUNTER — Ambulatory Visit
Admission: RE | Admit: 2022-03-16 | Discharge: 2022-03-16 | Disposition: A | Discharge status: Home / Self Care | Payer: 59 | Source: Ambulatory Visit | Attending: Gastroenterology | Admitting: Gastroenterology

## 2022-03-16 DIAGNOSIS — R7989 Other specified abnormal findings of blood chemistry: Secondary | ICD-10-CM | POA: Insufficient documentation

## 2022-04-05 ENCOUNTER — Other Ambulatory Visit: Payer: Self-pay

## 2022-04-05 ENCOUNTER — Ambulatory Visit (INDEPENDENT_AMBULATORY_CARE_PROVIDER_SITE_OTHER): Payer: 59 | Admitting: Gastroenterology

## 2022-04-05 ENCOUNTER — Other Ambulatory Visit: Payer: Self-pay | Admitting: Gastroenterology

## 2022-04-05 ENCOUNTER — Encounter: Payer: Self-pay | Admitting: Gastroenterology

## 2022-04-05 VITALS — BP 115/81 | HR 86 | Temp 98.6°F | Wt 208.0 lb

## 2022-04-05 DIAGNOSIS — Z23 Encounter for immunization: Secondary | ICD-10-CM | POA: Diagnosis not present

## 2022-04-05 DIAGNOSIS — K760 Fatty (change of) liver, not elsewhere classified: Secondary | ICD-10-CM | POA: Diagnosis not present

## 2022-04-05 DIAGNOSIS — F1010 Alcohol abuse, uncomplicated: Secondary | ICD-10-CM | POA: Diagnosis not present

## 2022-04-05 DIAGNOSIS — K449 Diaphragmatic hernia without obstruction or gangrene: Secondary | ICD-10-CM

## 2022-04-05 NOTE — Progress Notes (Signed)
Jonathon Bellows MD, MRCP(U.K) [address]  [place]  [place], [place] [address]  Main: [phone]  Fax: [phone]   Primary Care Physician: Gae Bon, NP  Primary Gastroenterologist:  Dr. Jonathon Bellows   Chief Complaint  Patient presents with   Ulcerative Colitis    HPI: Justin Powers is a 31 y.o. male  Summary of history :     He was initially referred and seen in January 2020 for iron deficiency anemia by Dr. Tasia Catchings.  At that point he also had a bit of rectal bleeding.  H. pylori breath test was negative.  TTG antibody was positive.  Treated for an infectious diarrhea secondary to adenovirus.  He underwent an EGD in January 2020 which noted a large hiatal hernia and severe esophagitis.  Biopsies of the duodenum did not demonstrate any villous blunting.  A colonoscopy was also performed at that point of time with random colon biopsies were normal.  I referred him to [facility] to obtain repair of his hiatal hernia. Treated in March 2020 for IBS-D with Xifaxan which helped the diarrhea.  Missed appointment at [facility] and has not scheduled it   Provided him the telephone number to call them and schedule it.   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   11/22/2019: Iron low at 29 percentage saturation 6.  Ferritin 13.5.  Hemoglobin 14.4 g.  12/18/2020 : EGD:LA grade B esophagitis 4 cm hiatal hernia toupee fundoplication performed.    Interval history 03/04/2022-04/05/2022   03/16/2022: Right upper quadrant ultrasound: Moderate hepatic steatosis.  03/04/2022: Not immune to hep A B- for viral hepatitis B and C.  HIV negative, smooth muscle antibody positive at 43 ceruloplasmin normal ferritin 400 iron saturation 80 celiac serology negative  hemochromatosis gene not detected.  He has not scheduled appointment with [facility] for repair of his hiatal hernia.  Still drinking alcohol but he states he has cut down.  Attended 8 sessions.  Does not help him does not have the money or resources to attend rehab facilities.          Current Outpatient Medications  Medication Sig Dispense Refill   albuterol (VENTOLIN HFA) 108 (90 Base) MCG/ACT inhaler      atorvastatin (LIPITOR) 20 MG tablet Take 20 mg by mouth daily.     BREO ELLIPTA 100-25 MCG/INH AEPB      buPROPion (WELLBUTRIN SR) 200 MG 12 hr tablet Take 200 mg by mouth 2 (two) times daily.     CHANTIX CONTINUING MONTH PAK 1 MG tablet See admin instructions.  2   clonazePAM (KLONOPIN) 0.5 MG tablet Take 0.5 mg by mouth daily as needed.     cyclobenzaprine (FLEXERIL) 10 MG tablet Take 10 mg by mouth 3 (three) times daily as needed.     D3-50 1.25 MG (50000 UT) capsule Take 50,000 Units by mouth once a week.     hydrOXYzine (ATARAX/VISTARIL) 25 MG tablet every 4 (four) hours as needed.     levofloxacin (LEVAQUIN) 500 MG tablet Take 500 mg by mouth daily.     lisinopril (ZESTRIL) 20 MG tablet Take 20 mg by mouth daily.     LORazepam (ATIVAN) 1 MG tablet PLEASE SEE ATTACHED FOR DETAILED DIRECTIONS     metoprolol succinate (  TOPROL-XL) 25 MG 24 hr tablet      NEXLIZET 180-10 MG TABS Take 1 tablet by mouth daily.     omeprazole (PRILOSEC) 40 MG capsule TAKE 1 CAPSULE BY MOUTH TWICE A DAY (INS PAYS 30/30DAYS) 30 capsule 11   ondansetron (ZOFRAN) 8 MG tablet Take by mouth.     propranolol (INDERAL) 40 MG tablet Take 40 mg by mouth 2 (two) times daily.     tamsulosin (FLOMAX) 0.4 MG CAPS capsule Take 0.4 mg by mouth daily.     VRAYLAR capsule Take 1.5 mg by mouth daily.     No current facility-administered medications for this visit.    Allergies as of 04/05/2022   (No Known Allergies)    ROS:  General: Negative for anorexia, weight loss, fever, chills, fatigue, weakness. ENT:  Negative for hoarseness, difficulty swallowing , nasal congestion. CV: Negative for chest pain, angina, palpitations, dyspnea on exertion, peripheral edema.  Respiratory: Negative for dyspnea at rest, dyspnea on exertion, cough, sputum, wheezing.  GI: See history of present illness. GU:  Negative for dysuria, hematuria, urinary incontinence, urinary frequency, nocturnal urination.  Endo: Negative for unusual weight change.    Physical Examination:   BP 115/81   Pulse 86   Temp 98.6 F (37 C) (Oral)   Wt 208 lb (94.3 kg)   BMI 32.58 kg/m   General: Well-nourished, well-developed in no acute distress.  Eyes: No icterus. Conjunctivae pink. Mouth: Oropharyngeal mucosa moist and pink , no lesions erythema or exudate. Neuro: Alert and oriented x 3.  Grossly intact. Skin: Warm and dry, no jaundice.   Psych: Alert and cooperative, normal mood and affect.   Imaging Studies: US Abdomen Limited RUQ (LIVER/GB)  Result Date: 03/16/2022 CLINICAL DATA:  31 year old male with abnormal LFTs. EXAM: ULTRASOUND ABDOMEN LIMITED RIGHT UPPER QUADRANT COMPARISON:  03/06/2016 ultrasound FINDINGS: Gallbladder: The gallbladder is unremarkable. There is no evidence of cholelithiasis or acute cholecystitis. Common bile duct: Diameter: 3 mm. There is no evidence of intrahepatic or extrahepatic biliary dilatation. Liver: Moderate diffuse increase in hepatic echogenicity is noted compatible with hepatic steatosis. No other hepatic abnormalities are identified. Portal vein is patent on color Doppler imaging with normal direction of blood flow towards the liver. Other: None. IMPRESSION: 1. Moderate hepatic steatosis. 2. Unremarkable gallbladder. No evidence of biliary dilatation. Electronically Signed   By: Harmon Pier M.D.   On: 03/16/2022 14:14    Assessment and Plan:   Justin Powers is a 31 y.o. y/o male  with a longstanding history of GERD and severe esophagitis.  Prior history of dilation of his esophagus for  strictures from reflux.  He underwent toupee surgery at [facility] which has failed and was recommended repeat surgery which she is thinking about.  In the interim was found to have elevated transaminases.  Very likely secondary to excess consumption of alcohol.  Autoimmune screen has been negative except a positive smooth muscle antibody which could be false positive which I will recheck again in a few months time  Plan 1.  Positive smooth muscle antibody, possibly nonspecific versus false positive I will recheck in 3 to 4 months time when he has cut down on alcohol consumption or stopped 2.  Stop all alcohol consumption discussed in detail has attended AA but has not helped does not have the resources to attend rehab facilities 3.  Alcoholic hepatosteatosis 4.  Needs hepatitis A and B vaccine which she has started and obtaining at an outside facility 5.  Recheck CMP  Dr [name]  MD,MRCP [facility]) Follow up in 4 to 5 months

## 2022-04-06 LAB — COMPREHENSIVE METABOLIC PANEL
ALT: 53 IU/L — ABNORMAL HIGH (ref 0–44)
AST: 142 IU/L — ABNORMAL HIGH (ref 0–40)
Albumin/Globulin Ratio: 1.3 (ref 1.2–2.2)
Albumin: 4.1 g/dL (ref 4.0–5.0)
Alkaline Phosphatase: 101 IU/L (ref 44–121)
BUN/Creatinine Ratio: 8 — ABNORMAL LOW (ref 9–20)
BUN: 8 mg/dL (ref 6–20)
Bilirubin Total: 0.6 mg/dL (ref 0.0–1.2)
CO2: 25 mmol/L (ref 20–29)
Calcium: 9.9 mg/dL (ref 8.7–10.2)
Chloride: 98 mmol/L (ref 96–106)
Creatinine, Ser: 0.95 mg/dL (ref 0.76–1.27)
Globulin, Total: 3.2 g/dL (ref 1.5–4.5)
Glucose: 82 mg/dL (ref 70–99)
Potassium: 3.8 mmol/L (ref 3.5–5.2)
Sodium: 143 mmol/L (ref 134–144)
Total Protein: 7.3 g/dL (ref 6.0–8.5)
eGFR: 110 mL/min/{1.73_m2} (ref >59)

## 2022-07-31 ENCOUNTER — Emergency Department: Payer: 59

## 2022-07-31 ENCOUNTER — Other Ambulatory Visit: Payer: Self-pay

## 2022-07-31 ENCOUNTER — Inpatient Hospital Stay
Admission: EM | Admit: 2022-07-31 | Discharge: 2022-08-05 | DRG: 439 | Disposition: A | Discharge status: Home / Self Care | Payer: 59 | Attending: Internal Medicine | Admitting: Internal Medicine

## 2022-07-31 ENCOUNTER — Inpatient Hospital Stay: Payer: 59

## 2022-07-31 ENCOUNTER — Encounter: Payer: Self-pay | Admitting: Emergency Medicine

## 2022-07-31 DIAGNOSIS — F1010 Alcohol abuse, uncomplicated: Secondary | ICD-10-CM | POA: Diagnosis present

## 2022-07-31 DIAGNOSIS — D509 Iron deficiency anemia, unspecified: Secondary | ICD-10-CM | POA: Diagnosis present

## 2022-07-31 DIAGNOSIS — K859 Acute pancreatitis without necrosis or infection, unspecified: Secondary | ICD-10-CM | POA: Diagnosis present

## 2022-07-31 DIAGNOSIS — Z79899 Other long term (current) drug therapy: Secondary | ICD-10-CM

## 2022-07-31 DIAGNOSIS — R718 Other abnormality of red blood cells: Secondary | ICD-10-CM | POA: Insufficient documentation

## 2022-07-31 DIAGNOSIS — E782 Mixed hyperlipidemia: Secondary | ICD-10-CM | POA: Diagnosis present

## 2022-07-31 DIAGNOSIS — E876 Hypokalemia: Secondary | ICD-10-CM | POA: Diagnosis present

## 2022-07-31 DIAGNOSIS — E860 Dehydration: Secondary | ICD-10-CM | POA: Diagnosis present

## 2022-07-31 DIAGNOSIS — K852 Alcohol induced acute pancreatitis without necrosis or infection: Secondary | ICD-10-CM | POA: Diagnosis present

## 2022-07-31 DIAGNOSIS — R109 Unspecified abdominal pain: Secondary | ICD-10-CM | POA: Insufficient documentation

## 2022-07-31 DIAGNOSIS — F419 Anxiety disorder, unspecified: Secondary | ICD-10-CM | POA: Diagnosis present

## 2022-07-31 DIAGNOSIS — N179 Acute kidney failure, unspecified: Secondary | ICD-10-CM

## 2022-07-31 DIAGNOSIS — Z7951 Long term (current) use of inhaled steroids: Secondary | ICD-10-CM

## 2022-07-31 DIAGNOSIS — F17210 Nicotine dependence, cigarettes, uncomplicated: Secondary | ICD-10-CM | POA: Diagnosis present

## 2022-07-31 DIAGNOSIS — K760 Fatty (change of) liver, not elsewhere classified: Secondary | ICD-10-CM | POA: Diagnosis present

## 2022-07-31 DIAGNOSIS — Z7141 Alcohol abuse counseling and surveillance of alcoholic: Secondary | ICD-10-CM

## 2022-07-31 DIAGNOSIS — Z8249 Family history of ischemic heart disease and other diseases of the circulatory system: Secondary | ICD-10-CM

## 2022-07-31 DIAGNOSIS — Z8639 Personal history of other endocrine, nutritional and metabolic disease: Secondary | ICD-10-CM

## 2022-07-31 DIAGNOSIS — K2100 Gastro-esophageal reflux disease with esophagitis, without bleeding: Secondary | ICD-10-CM | POA: Diagnosis present

## 2022-07-31 DIAGNOSIS — I10 Essential (primary) hypertension: Secondary | ICD-10-CM | POA: Diagnosis present

## 2022-07-31 DIAGNOSIS — R1010 Upper abdominal pain, unspecified: Secondary | ICD-10-CM

## 2022-07-31 DIAGNOSIS — K449 Diaphragmatic hernia without obstruction or gangrene: Secondary | ICD-10-CM

## 2022-07-31 DIAGNOSIS — E538 Deficiency of other specified B group vitamins: Secondary | ICD-10-CM | POA: Diagnosis present

## 2022-07-31 DIAGNOSIS — R112 Nausea with vomiting, unspecified: Secondary | ICD-10-CM

## 2022-07-31 DIAGNOSIS — M549 Dorsalgia, unspecified: Secondary | ICD-10-CM

## 2022-07-31 DIAGNOSIS — N189 Chronic kidney disease, unspecified: Secondary | ICD-10-CM

## 2022-07-31 DIAGNOSIS — R110 Nausea: Secondary | ICD-10-CM | POA: Insufficient documentation

## 2022-07-31 LAB — COMPREHENSIVE METABOLIC PANEL
ALT: 34 U/L (ref 0–44)
AST: 82 U/L — ABNORMAL HIGH (ref 15–41)
Albumin: 3.8 g/dL (ref 3.5–5.0)
Alkaline Phosphatase: 106 U/L (ref 38–126)
Anion gap: 15 (ref 5–15)
BUN: 12 mg/dL (ref 6–20)
CO2: 42 mmol/L — ABNORMAL HIGH (ref 22–32)
Calcium: 15 mg/dL (ref 8.9–10.3)
Chloride: 79 mmol/L — ABNORMAL LOW (ref 98–111)
Creatinine, Ser: 1.97 mg/dL — ABNORMAL HIGH (ref 0.61–1.24)
GFR, Estimated: 46 mL/min — ABNORMAL LOW (ref >60)
Glucose, Bld: 148 mg/dL — ABNORMAL HIGH (ref 70–99)
Potassium: 2.5 mmol/L — CL (ref 3.5–5.1)
Sodium: 136 mmol/L (ref 135–145)
Total Bilirubin: 1.6 mg/dL — ABNORMAL HIGH (ref 0.3–1.2)
Total Protein: 8.5 g/dL — ABNORMAL HIGH (ref 6.5–8.1)

## 2022-07-31 LAB — TROPONIN I (HIGH SENSITIVITY)
Troponin I (High Sensitivity): 5 ng/L (ref <18)
Troponin I (High Sensitivity): 6 ng/L (ref <18)

## 2022-07-31 LAB — URINALYSIS, ROUTINE W REFLEX MICROSCOPIC
Glucose, UA: NEGATIVE mg/dL
Hgb urine dipstick: NEGATIVE
Ketones, ur: NEGATIVE mg/dL
Leukocytes,Ua: NEGATIVE
Nitrite: NEGATIVE
Protein, ur: 100 mg/dL — AB
Specific Gravity, Urine: 1.015 (ref 1.005–1.030)
pH: 7 (ref 5.0–8.0)

## 2022-07-31 LAB — CREATININE, SERUM
Creatinine, Ser: 1.8 mg/dL — ABNORMAL HIGH (ref 0.61–1.24)
GFR, Estimated: 51 mL/min — ABNORMAL LOW (ref >60)

## 2022-07-31 LAB — CBC
HCT: 38.3 % — ABNORMAL LOW (ref 39.0–52.0)
Hemoglobin: 13.5 g/dL (ref 13.0–17.0)
MCH: 37.2 pg — ABNORMAL HIGH (ref 26.0–34.0)
MCHC: 35.2 g/dL (ref 30.0–36.0)
MCV: 105.5 fL — ABNORMAL HIGH (ref 80.0–100.0)
Platelets: 194 10*3/uL (ref 150–400)
RBC: 3.63 MIL/uL — ABNORMAL LOW (ref 4.22–5.81)
RDW: 16.8 % — ABNORMAL HIGH (ref 11.5–15.5)
WBC: 14.5 10*3/uL — ABNORMAL HIGH (ref 4.0–10.5)
nRBC: 0 % (ref 0.0–0.2)

## 2022-07-31 LAB — CBC WITH DIFFERENTIAL/PLATELET
Abs Immature Granulocytes: 0.11 10*3/uL — ABNORMAL HIGH (ref 0.00–0.07)
Basophils Absolute: 0.1 10*3/uL (ref 0.0–0.1)
Basophils Relative: 0 %
Eosinophils Absolute: 0.1 10*3/uL (ref 0.0–0.5)
Eosinophils Relative: 0 %
HCT: 47.6 % (ref 39.0–52.0)
Hemoglobin: 16.4 g/dL (ref 13.0–17.0)
Immature Granulocytes: 1 %
Lymphocytes Relative: 11 %
Lymphs Abs: 2.4 10*3/uL (ref 0.7–4.0)
MCH: 35.9 pg — ABNORMAL HIGH (ref 26.0–34.0)
MCHC: 34.5 g/dL (ref 30.0–36.0)
MCV: 104.2 fL — ABNORMAL HIGH (ref 80.0–100.0)
Monocytes Absolute: 1.2 10*3/uL — ABNORMAL HIGH (ref 0.1–1.0)
Monocytes Relative: 6 %
Neutro Abs: 17.1 10*3/uL — ABNORMAL HIGH (ref 1.7–7.7)
Neutrophils Relative %: 82 %
Platelets: 336 10*3/uL (ref 150–400)
RBC: 4.57 MIL/uL (ref 4.22–5.81)
RDW: 17.1 % — ABNORMAL HIGH (ref 11.5–15.5)
WBC: 20.9 10*3/uL — ABNORMAL HIGH (ref 4.0–10.5)
nRBC: 0 % (ref 0.0–0.2)

## 2022-07-31 LAB — LIPASE, BLOOD: Lipase: 305 U/L — ABNORMAL HIGH (ref 11–51)

## 2022-07-31 LAB — MAGNESIUM: Magnesium: 0.8 mg/dL — CL (ref 1.7–2.4)

## 2022-07-31 LAB — PHOSPHORUS: Phosphorus: 1.8 mg/dL — ABNORMAL LOW (ref 2.5–4.6)

## 2022-07-31 MED ORDER — PANTOPRAZOLE SODIUM 40 MG IV SOLR
40.0000 mg | Freq: Two times a day (BID) | INTRAVENOUS | Status: DC
  Administered 2022-07-31 – 2022-08-05 (×10): 40 mg via INTRAVENOUS
  Filled 2022-07-31 (×10): qty 10

## 2022-07-31 MED ORDER — POTASSIUM CHLORIDE 10 MEQ/100ML IV SOLN
10.0000 meq | Freq: Once | INTRAVENOUS | Status: AC
  Administered 2022-07-31: 10 meq via INTRAVENOUS
  Filled 2022-07-31: qty 100

## 2022-07-31 MED ORDER — ONDANSETRON HCL 4 MG PO TABS
4.0000 mg | ORAL_TABLET | Freq: Four times a day (QID) | ORAL | Status: DC | PRN
  Administered 2022-08-05: 4 mg via ORAL
  Filled 2022-07-31: qty 1

## 2022-07-31 MED ORDER — MAGNESIUM OXIDE -MG SUPPLEMENT 400 (240 MG) MG PO TABS
400.0000 mg | ORAL_TABLET | Freq: Two times a day (BID) | ORAL | Status: AC
  Administered 2022-07-31 – 2022-08-01 (×3): 400 mg via ORAL
  Filled 2022-07-31 (×3): qty 1

## 2022-07-31 MED ORDER — FENTANYL CITRATE PF 50 MCG/ML IJ SOSY
50.0000 ug | PREFILLED_SYRINGE | Freq: Once | INTRAMUSCULAR | Status: AC
  Administered 2022-07-31: 50 ug via INTRAVENOUS
  Filled 2022-07-31: qty 1

## 2022-07-31 MED ORDER — MAGNESIUM SULFATE 2 GM/50ML IV SOLN
2.0000 g | Freq: Once | INTRAVENOUS | Status: AC
  Administered 2022-07-31: 2 g via INTRAVENOUS
  Filled 2022-07-31: qty 50

## 2022-07-31 MED ORDER — SODIUM CHLORIDE 0.9 % IV BOLUS
1000.0000 mL | Freq: Once | INTRAVENOUS | Status: AC
  Administered 2022-07-31: 1000 mL via INTRAVENOUS

## 2022-07-31 MED ORDER — POTASSIUM PHOSPHATES 15 MMOLE/5ML IV SOLN
30.0000 mmol | Freq: Once | INTRAVENOUS | Status: AC
  Administered 2022-07-31: 30 mmol via INTRAVENOUS
  Filled 2022-07-31: qty 10

## 2022-07-31 MED ORDER — PANTOPRAZOLE SODIUM 40 MG IV SOLR
40.0000 mg | Freq: Once | INTRAVENOUS | Status: AC
  Administered 2022-07-31: 40 mg via INTRAVENOUS
  Filled 2022-07-31: qty 10

## 2022-07-31 MED ORDER — ONDANSETRON HCL 4 MG/2ML IJ SOLN
4.0000 mg | Freq: Once | INTRAMUSCULAR | Status: AC
  Administered 2022-07-31: 4 mg via INTRAVENOUS
  Filled 2022-07-31: qty 2

## 2022-07-31 MED ORDER — MORPHINE SULFATE (PF) 4 MG/ML IV SOLN
4.0000 mg | Freq: Once | INTRAVENOUS | Status: AC
  Administered 2022-07-31: 4 mg via INTRAVENOUS
  Filled 2022-07-31: qty 1

## 2022-07-31 MED ORDER — MORPHINE SULFATE (PF) 2 MG/ML IV SOLN
2.0000 mg | INTRAVENOUS | Status: DC | PRN
  Administered 2022-07-31 – 2022-08-01 (×5): 2 mg via INTRAVENOUS
  Filled 2022-07-31 (×5): qty 1

## 2022-07-31 MED ORDER — LISINOPRIL 10 MG PO TABS
20.0000 mg | ORAL_TABLET | Freq: Every day | ORAL | Status: DC
  Administered 2022-07-31 – 2022-08-01 (×2): 20 mg via ORAL
  Filled 2022-07-31 (×2): qty 2

## 2022-07-31 MED ORDER — ACETAMINOPHEN 650 MG RE SUPP: 650.0000 mg | Freq: Four times a day (QID) | RECTAL | Status: DC | PRN

## 2022-07-31 MED ORDER — TAMSULOSIN HCL 0.4 MG PO CAPS
0.4000 mg | ORAL_CAPSULE | Freq: Every day | ORAL | Status: DC
  Administered 2022-07-31 – 2022-08-05 (×6): 0.4 mg via ORAL
  Filled 2022-07-31 (×6): qty 1

## 2022-07-31 MED ORDER — LACTATED RINGERS IV SOLN: INTRAVENOUS | Status: AC

## 2022-07-31 MED ORDER — ONDANSETRON HCL 4 MG/2ML IJ SOLN
4.0000 mg | Freq: Four times a day (QID) | INTRAMUSCULAR | Status: DC | PRN
  Administered 2022-08-01: 4 mg via INTRAVENOUS
  Filled 2022-07-31: qty 2

## 2022-07-31 MED ORDER — POTASSIUM CHLORIDE 10 MEQ/100ML IV SOLN
10.0000 meq | INTRAVENOUS | Status: AC
  Administered 2022-07-31 – 2022-08-01 (×2): 10 meq via INTRAVENOUS
  Filled 2022-07-31: qty 100

## 2022-07-31 MED ORDER — ENOXAPARIN SODIUM 40 MG/0.4ML IJ SOSY
40.0000 mg | PREFILLED_SYRINGE | INTRAMUSCULAR | Status: DC
  Administered 2022-07-31 – 2022-08-04 (×5): 40 mg via SUBCUTANEOUS
  Filled 2022-07-31 (×5): qty 0.4

## 2022-07-31 MED ORDER — ACETAMINOPHEN 325 MG PO TABS
650.0000 mg | ORAL_TABLET | Freq: Four times a day (QID) | ORAL | Status: DC | PRN
  Administered 2022-07-31 – 2022-08-01 (×2): 650 mg via ORAL
  Filled 2022-07-31 (×2): qty 2

## 2022-07-31 MED ORDER — POTASSIUM CHLORIDE CRYS ER 20 MEQ PO TBCR
40.0000 meq | EXTENDED_RELEASE_TABLET | Freq: Once | ORAL | Status: AC
  Administered 2022-07-31: 40 meq via ORAL
  Filled 2022-07-31: qty 2

## 2022-07-31 MED ORDER — IOHEXOL 300 MG/ML  SOLN
80.0000 mL | Freq: Once | INTRAMUSCULAR | Status: AC | PRN
  Administered 2022-07-31: 80 mL via INTRAVENOUS

## 2022-07-31 NOTE — ED Provider Triage Note (Signed)
  Emergency Medicine Provider Triage Evaluation Note  Justin Powers , a 31 y.o.male,  was evaluated in triage.  Pt complains of epigastric pain.  Patient states that he has a extensive history of hiatal hernias and GERD.  He has had surgery for this hernia in the past.  He states that over the past week, he has had persistent epigastric pain radiating to his back which is worse than usual.  He states that he is barely able to hold down any food or water.   Review of Systems  Positive: Epigastric pain, back pain Negative: Denies fever, chest pain, vomiting  Physical Exam  There were no vitals filed for this visit. Gen:   Awake, no distress   Resp:  Normal effort  MSK:   Moves extremities without difficulty  Other:    Medical Decision Making  Given the patient's initial medical screening exam, the following diagnostic evaluation has been ordered. The patient will be placed in the appropriate treatment space, once one is available, to complete the evaluation and treatment. I have discussed the plan of care with the patient and I have advised the patient that an ED physician or mid-level practitioner will reevaluate their condition after the test results have been received, as the results may give them additional insight into the type of treatment they may need.    Diagnostics: Labs, UA, EKG  Treatments: none immediately   Teodoro Spray, [place] 07/31/22 1432

## 2022-07-31 NOTE — ED Notes (Signed)
Critical lab called.    Calcium 15.0  Potassium 2.5

## 2022-07-31 NOTE — H&P (Signed)
History and Physical    Patient: Justin Powers WUX:[id number] DOB: 1991-07-11 DOA: 07/31/2022 DOS: the patient was seen and examined on 07/31/2022 PCP: Franciso Bend, NP  Patient coming from: Home  Chief Complaint:  Chief Complaint  Patient presents with   Chest Pain   Nausea   Shortness of Breath   HPI: Justin Powers is a 31 y.o. male with medical history significant of alcohol abuse, iron deficiency anemia, hiatal hernia, history of dysphagia, hyperlipidemia, GERD, esophagitis who presents to the emergency department due to 3-day onset of upper abdominal pain, this was associated with nausea and several episodes of nonbloody vomiting.  Patient states that he has not been able to keep anything down since onset of symptoms, he states that he drinks 2 16 ounce beers daily and last alcohol intake was 4 days ago.  Patient denies vomiting of blood or blood in stool.  He states that he has had Nissen fundoplication due to severe esophagitis in the past which failed and that he needed to follow-up with [facility] for another procedure.  He denies shortness of breath, fever, chills, headache, blurry vision, diarrhea.  ED Course:  In the emergency department, temperature 98.3 F, respiratory rate 16/min, pulse 104 bpm, BP 125/100, O2 sat 97% on room air.  Work-up in the ED showed WBC 20.9, H/H 16.4/47.6, MCV 104.2, platelets 336.  Lipase 305, BMP showed sodium 136, potassium 2.5, chloride 79, bicarb 42, glucose 148, BUN 12, creatinine 1.97 (baseline creatinine 0.8-1.1), calcium 15.  Phosphorus 1.8, magnesium 0.8.  Troponin x1 -  5 CT abdomen and pelvis with contrast showed findings compatible with acute pancreatitis, hepatomegaly and fatty infiltration of liver, moderate size hiatal hernia and distal esophageal wall thickening, likely related to esophagitis Chest x-ray showed no active cardiopulmonary disease Patient was treated with IV fentanyl.  Magnesium and potassium were replenished,.  Protonix was  given, morphine was given due to pain and Zofran was given due to nausea and vomiting.  Hospitalist was asked to admit patient for further evaluation and management.  Review of Systems: Review of systems as noted in the HPI. All other systems reviewed and are negative.   Past Medical History:  Diagnosis Date   Anemia    Hypertension    Past Surgical History:  Procedure Laterality Date   COLONOSCOPY WITH PROPOFOL N/A 11/08/2018   Procedure: COLONOSCOPY WITH PROPOFOL;  Surgeon: [name], MD;  Location: [facility] ENDOSCOPY;  Service: Gastroenterology;  Laterality: N/A;   ESOPHAGOGASTRODUODENOSCOPY (EGD) WITH PROPOFOL N/A 11/08/2018   Procedure: ESOPHAGOGASTRODUODENOSCOPY (EGD) WITH PROPOFOL;  Surgeon: [name], MD;  Location: [facility] ENDOSCOPY;  Service: Gastroenterology;  Laterality: N/A;   ESOPHAGOGASTRODUODENOSCOPY (EGD) WITH PROPOFOL N/A 03/06/2019   Procedure: ESOPHAGOGASTRODUODENOSCOPY (EGD) WITH PROPOFOL with Dilation;  Surgeon: [name], MD;  Location: [facility] ENDOSCOPY;  Service: Gastroenterology;  Laterality: N/A;   ESOPHAGOGASTRODUODENOSCOPY (EGD) WITH PROPOFOL N/A 05/01/2019   Procedure: ESOPHAGOGASTRODUODENOSCOPY (EGD) WITH PROPOFOL with Dilation;  Surgeon: [name], MD;  Location: [facility] ENDOSCOPY;  Service: Gastroenterology;  Laterality: N/A;   ESOPHAGOGASTRODUODENOSCOPY (EGD) WITH PROPOFOL N/A 09/21/2019   Procedure: ESOPHAGOGASTRODUODENOSCOPY (EGD) WITH PROPOFOL with Dilation;  Surgeon: [name], MD;  Location: [facility] ENDOSCOPY;  Service: Gastroenterology;  Laterality: N/A;   wisdon tooth removal      Social History:  reports that he has been smoking cigarettes. He has been smoking an average of 1 pack per day. He has never used smokeless tobacco. He reports that he does not currently use alcohol. He reports that he does not  use drugs.   No Known Allergies  Family History  Problem Relation Age of Onset   Crohn's disease Mother    Hypertension Mother    Breast cancer  Maternal Aunt    Bladder Cancer Maternal Grandfather    Cervical cancer Other    Non-Hodgkin's lymphoma Other      Prior to Admission medications   Medication Sig Start Date End Date Taking? Authorizing Provider  albuterol (VENTOLIN HFA) 108 (90 Base) MCG/ACT inhaler  02/19/19   [provider]  atorvastatin (LIPITOR) 20 MG tablet Take 20 mg by mouth daily. 04/24/19   [provider]  Earlie Server 100-25 MCG/INH AEPB  02/19/19   [provider]  buPROPion (WELLBUTRIN SR) 200 MG 12 hr tablet Take 200 mg by mouth 2 (two) times daily. 09/12/19   [provider]  CHANTIX CONTINUING MONTH PAK 1 MG tablet See admin instructions. 09/12/18   [provider]  clonazePAM (KLONOPIN) 0.5 MG tablet Take 0.5 mg by mouth daily as needed. 09/25/20   [provider]  cyclobenzaprine (FLEXERIL) 10 MG tablet Take 10 mg by mouth 3 (three) times daily as needed. 04/05/21   [provider]  D3-50 1.25 MG (50000 UT) capsule Take 50,000 Units by mouth once a week. 09/04/19   [provider]  hydrOXYzine (ATARAX/VISTARIL) 25 MG tablet every 4 (four) hours as needed. 11/02/18   [provider]  levofloxacin (LEVAQUIN) 500 MG tablet Take 500 mg by mouth daily. 02/16/21   [provider]  lisinopril (ZESTRIL) 20 MG tablet Take 20 mg by mouth daily. 06/01/21   [provider]  LORazepam (ATIVAN) 1 MG tablet PLEASE SEE ATTACHED FOR DETAILED DIRECTIONS 09/06/19   [provider]  metoprolol succinate (TOPROL-XL) 25 MG 24 hr tablet  04/18/19   [provider]  NEXLIZET 180-10 MG TABS Take 1 tablet by mouth daily. 03/22/22   [provider]  omeprazole (PRILOSEC) 40 MG capsule TAKE 1 CAPSULE BY MOUTH TWICE A DAY (INS PAYS 30/30DAYS) 05/22/20   [name], MD  ondansetron (ZOFRAN) 8 MG tablet Take by mouth. 02/16/21   [provider]  propranolol (INDERAL) 40 MG tablet Take 40 mg by mouth 2 (two) times daily.  02/08/21   [provider]  tamsulosin (FLOMAX) 0.4 MG CAPS capsule Take 0.4 mg by mouth daily. 02/16/21   [provider]  VRAYLAR capsule Take 1.5 mg by mouth daily. 09/06/19   [provider]    Physical Exam: BP (!) 118/90   Pulse 98   Temp 98.3 F (36.8 C) (Oral)   Resp 16   Ht 5\' 7"  (1.702 m)   Wt 90.7 kg   SpO2 100%   BMI 31.32 kg/m   General: 31 y.o. year-old male ill appearing, but in no acute distress.  Alert and oriented x3. HEENT: NCAT, EOMI, dry mucous membranes Neck: Supple, trachea medial Cardiovascular: Regular rate and rhythm with no rubs or gallops.  No thyromegaly or JVD noted.  No lower extremity edema. 2/4 pulses in all 4 extremities. Respiratory: Clear to auscultation with no wheezes or rales. Good inspiratory effort. Abdomen: Soft, tender to palpation of epigastrium without guarding.  Nondistended with normal bowel sounds x4 quadrants. Muskuloskeletal: No cyanosis, clubbing or edema noted bilaterally Neuro: CN II-XII intact, strength 5/5 x 4, sensation, reflexes intact Skin: No ulcerative lesions noted or rashes Psychiatry: Judgement and insight appear normal. Mood is appropriate for condition and setting  Labs on Admission:  Basic Metabolic Panel: Recent Labs  Lab 07/31/22 1518 07/31/22 1633  NA 136  --   K 2.5*  --   CL 79*  --   CO2 42*  --   GLUCOSE 148*  --   BUN 12  --   CREATININE 1.97*  --   CALCIUM 15.0*  --   MG 0.8*  --   PHOS  --  1.8*   Liver Function Tests: Recent Labs  Lab 07/31/22 1518  AST 82*  ALT 34  ALKPHOS 106  BILITOT 1.6*  PROT 8.5*  ALBUMIN 3.8   Recent Labs  Lab 07/31/22 1518  LIPASE 305*   No results for input(s): "AMMONIA" in the last 168 hours. CBC: Recent Labs  Lab 07/31/22 1518  WBC 20.9*  NEUTROABS 17.1*  HGB 16.4  HCT 47.6  MCV 104.2*  PLT 336   Cardiac Enzymes: No results for input(s): "CKTOTAL", "CKMB", "CKMBINDEX", "TROPONINI" in the last 168  hours.  BNP (last 3 results) No results for input(s): "BNP" in the last 8760 hours.  ProBNP (last 3 results) No results for input(s): "PROBNP" in the last 8760 hours.  CBG: No results for input(s): "GLUCAP" in the last 168 hours.  Radiological Exams on Admission: CT ABDOMEN PELVIS W CONTRAST  Result Date: 07/31/2022 CLINICAL DATA:  Epigastric pain EXAM: CT ABDOMEN AND PELVIS WITH CONTRAST TECHNIQUE: Multidetector CT imaging of the abdomen and pelvis was performed using the standard protocol following bolus administration of intravenous contrast. RADIATION DOSE REDUCTION: This exam was performed according to the departmental dose-optimization program which includes automated exposure control, adjustment of the mA and/or kV according to patient size and/or use of iterative reconstruction technique. CONTRAST:  69mL OMNIPAQUE IOHEXOL 300 MG/ML  SOLN COMPARISON:  None Available. FINDINGS: Lower chest: No acute abnormality. Hepatobiliary: The liver is enlarged. There is diffuse fatty infiltration of the liver. Gallbladder and bile ducts are within normal limits. Pancreas: There is a small to moderate amount of fluid and stranding surrounding the pancreas. No ductal dilatation or fluid collection. Pancreas enhances normally. Spleen: Normal in size without focal abnormality. Adrenals/Urinary Tract: Adrenal glands are unremarkable. Kidneys are normal, without renal calculi, focal lesion, or hydronephrosis. Bladder is unremarkable. Stomach/Bowel: There is some distal esophageal wall thickening. There is a moderate-sized hiatal hernia. Stomach is otherwise within normal limits. Appendix appears normal. No evidence of bowel wall thickening, distention, or inflammatory changes. Vascular/Lymphatic: No significant vascular findings are present. No enlarged abdominal or pelvic lymph nodes. Reproductive: Prostate is unremarkable. Other: There is no ascites or free air. There is no focal abdominal wall hernia.  Musculoskeletal: No acute or significant osseous findings. IMPRESSION: 1. Findings compatible with acute pancreatitis. No evidence for pancreatic necrosis or fluid collection. 2. Hepatomegaly and fatty infiltration of the liver. 3. Moderate-sized hiatal hernia. 4. Distal esophageal wall thickening, likely related to esophagitis. Electronically Signed   By: Darliss Cheney M.D.   On: 07/31/2022 18:27   DG Chest 2 View  Result Date: 07/31/2022 CLINICAL DATA:  Chest pain EXAM: CHEST - 2 VIEW COMPARISON:  Chest radiograph dated August 29, 2018. FINDINGS: The heart size and mediastinal contours are within normal limits. Both lungs are clear. Mild thoracic kyphosis. No acute osseous abnormality. IMPRESSION: No active cardiopulmonary disease. Electronically Signed   By: Larose Hires D.O.   On: 07/31/2022 14:50    EKG: I independently viewed the EKG done and my findings are as followed: Sinus tachycardia at a rate of 110 bpm  Assessment/Plan Present on Admission:  Acute pancreatitis  Principal Problem:   Acute pancreatitis Active Problems:   Iron deficiency anemia   Hiatal hernia   Abdominal pain   Nausea & vomiting   Hypomagnesemia   Hypokalemia   Hypophosphatemia   Hypercalcemia   Acute kidney injury superimposed on chronic kidney disease (HCC)   Essential hypertension   Mixed hyperlipidemia   Alcohol abuse   Elevated MCV  Acute pancreatitis BISAP Score = 0 points (< 1 % risk of mortality) This is possibly due to patient's history of alcoholism Lipase level was 305 CT abdomen and pelvis was suggestive of acute pancreatitis Continue IV Zofran p.r.n Continue IV morphine p.r.n for pain Continue Protonix Continue IV LR at 135ml/Hr Continue full liquid diet with plan to advance diet as tolerated RUQ U/S in the morning to investigate biliary etiology (gallstone and bile duct dilatation  Abdominal pain, nausea and vomiting in the setting of above Continue IV NS hydration Continue IV  morphine 2 mg q.3h p.r.n. for moderate to severe pain Continue IV Zofran p.r.n. Continue full liquid diet with plan to advance diet as tolerated  Abnormal electrolytes Hypomagnesemia 0.8 Hypokalemia 2.5 Hypophosphatemia 1.8 Hypercalcemia 15.0 These will be repleted, continue to monitor electrolytes and further replete as required Continue telemetry  Alcohol abuse AST 82, ALT 34, ALP 116 Patient was counseled on alcohol abuse cessation  History of esophagitis Patient states that he has had prior Nissen fundoplication which failed and that he was yet still follow-up for another procedure in [facility] Continue propanolol  Hiatal hernia Continue Protonix  Iron deficiency anemia Continue ferrous sulfate  Acute kidney injury Dehydration Creatinine 1.97 (baseline creatinine 0.8-1.1) Continue gentle hydration Renally adjust medications, avoid nephrotoxic agents/dehydration/hypotension  Leukocytosis possibly reactive WBC 20.9, continue to monitor WBC with morning labs  Elevated MCV MCV 104.2, this may be related to patient's history of alcoholism, B12 or folate deficiency B12 and folate levels will be checked  Essential hypertension Continue lisinopril, Flomax, propanolol  Mixed hyperlipidemia Lipitor will be temporarily held at this time due to elevated AST  DVT prophylaxis: Lovenox  Code Status: Full code  Consults: None  Family Communication: Father at bedside (all questions answered to satisfaction)  Severity of Illness: The appropriate patient status for this patient is INPATIENT. Inpatient status is judged to be reasonable and necessary in order to provide the required intensity of service to ensure the patient's safety. The patient's presenting symptoms, physical exam findings, and initial radiographic and laboratory data in the context of their chronic comorbidities is felt to place them at high risk for further clinical deterioration. Furthermore, it is not anticipated  that the patient will be medically stable for discharge from the hospital within 2 midnights of admission.   * I certify that at the point of admission it is my clinical judgment that the patient will require inpatient hospital care spanning beyond 2 midnights from the point of admission due to high intensity of service, high risk for further deterioration and high frequency of surveillance required.*  Author: Bernadette Hoit, DO 07/31/2022 10:11 PM  For on call review [email or web address].

## 2022-07-31 NOTE — ED Provider Notes (Signed)
[facility] Provider Note    Event Date/Time   First MD Initiated Contact with Patient 07/31/22 1546     (approximate)   History   Chest Pain, Nausea, and Shortness of Breath   HPI  Justin Powers is a 31 y.o. male with past medical history significant for severe esophagitis, alcohol abuse,presents emergency department with upper abdominal pain.  States on Thursday he developed upper abdominal pain and has had multiple episodes of nausea and vomiting.  States he is unable to keep anything down.  Complaining of severe pain.  Last drink of alcohol was on Tuesday.  Does have a history of alcohol abuse.  States that the longest amount of time he is gone without drinking was a couple of months.  Denies any blood in his stool or diarrhea.  History of a Nissen fundoplication secondary to severe esophagitis that "failed" stated that he needed another surgery however he has not yet followed up with [facility].      Physical Exam   Triage Vital Signs: ED Triage Vitals  Enc Vitals Group     BP 07/31/22 1510 (!) 125/100     Pulse Rate 07/31/22 1510 (!) 104     Resp 07/31/22 1510 16     Temp 07/31/22 1510 98.3 F (36.8 C)     Temp Source 07/31/22 1510 Oral     SpO2 07/31/22 1510 97 %     Weight 07/31/22 1431 200 lb (90.7 kg)     Height 07/31/22 1431 5\' 7"  (1.702 m)     Head Circumference --      Peak Flow --      Pain Score 07/31/22 1431 8     Pain Loc --      Pain Edu? --      Excl. in GC? --     Most recent vital signs: Vitals:   07/31/22 1510  BP: (!) 125/100  Pulse: (!) 104  Resp: 16  Temp: 98.3 F (36.8 C)  SpO2: 97%    Physical Exam Constitutional:      Appearance: He is well-developed. He is ill-appearing.  HENT:     Head: Atraumatic.  Eyes:     Conjunctiva/sclera: Conjunctivae normal.  Cardiovascular:     Rate and Rhythm: Regular rhythm. Tachycardia present.     Heart sounds: Normal heart sounds.  Pulmonary:     Effort: Pulmonary effort is  normal. No respiratory distress.     Breath sounds: Normal breath sounds.  Abdominal:     Palpations: Abdomen is soft.     Tenderness: There is abdominal tenderness (Epigastric abdominal tenderness to palpation).  Musculoskeletal:        General: Normal range of motion.     Cervical back: Normal range of motion.     Right lower leg: No edema.     Left lower leg: No edema.  Skin:    General: Skin is warm.     Capillary Refill: Capillary refill takes more than 3 seconds.  Neurological:     Mental Status: He is alert and oriented to person, place, and time.          IMPRESSION / MDM / ASSESSMENT AND PLAN / ED COURSE  I reviewed the triage vital signs and the nursing notes.  Patient is a 31 year old male with past medical history significant for esophagitis status post Nissen fundoplication, alcohol abuse, presents to the emergency department with upper abdominal pain with nausea and vomiting.  On arrival patient  is ill-appearing, appears significantly dehydrated.  Differential diagnosis including pancreatitis, gastritis/PUD, perforated ulcer, acute cholecystitis, hepatitis, alcohol withdrawal.  Patient was given 1 L of IV fluids, IV antiemetics and IV pain medication  Magnesium and phosphorus came back low.  Given IV magnesium and another 1 L of IV fluids.  Continues to have ongoing pain 6 and another bolus of IV fentanyl.  Currently waiting for CT abdomen and pelvis with contrast to evaluate for intra-abdominal infectious process.  Given IV Protonix.  6:39 PM  on reevaluation ongoing abdominal pain.  Given another dose of IV fentanyl.  CT scan with findings consistent with acute pancreatitis with no abscess or pseudocyst.  No signs of necrotizing findings.  Consulted the hospitalist for admission for acute pancreatitis with multiple electrolyte abnormalities.  ED Results / Procedures / Treatments   Labs (all labs ordered are listed, but only abnormal results are  displayed) Labs interpreted as -   Patient with multiple electrolyte abnormalities signs of hypokalemia, hypomagnesia and hyper phosphorus.  Significantly elevated calcium level of 15.  Creatinine appears to be elevated.  Elevated BUN.  Mildly elevated lipase in the 300s.  No significantly elevated LFTs.  Labs Reviewed  COMPREHENSIVE METABOLIC PANEL - Abnormal; Notable for the following components:      Result Value   Potassium 2.5 (*)    Chloride 79 (*)    CO2 42 (*)    Glucose, Bld 148 (*)    Creatinine, Ser 1.97 (*)    Calcium 15.0 (*)    Total Protein 8.5 (*)    AST 82 (*)    Total Bilirubin 1.6 (*)    GFR, Estimated 46 (*)    All other components within normal limits  LIPASE, BLOOD - Abnormal; Notable for the following components:   Lipase 305 (*)    All other components within normal limits  CBC WITH DIFFERENTIAL/PLATELET - Abnormal; Notable for the following components:   WBC 20.9 (*)    MCV 104.2 (*)    MCH 35.9 (*)    RDW 17.1 (*)    Neutro Abs 17.1 (*)    Monocytes Absolute 1.2 (*)    Abs Immature Granulocytes 0.11 (*)    All other components within normal limits  URINALYSIS, ROUTINE W REFLEX MICROSCOPIC - Abnormal; Notable for the following components:   Color, Urine AMBER (*)    APPearance CLOUDY (*)    Bilirubin Urine SMALL (*)    Protein, ur 100 (*)    Bacteria, UA MANY (*)    All other components within normal limits  MAGNESIUM - Abnormal; Notable for the following components:   Magnesium 0.8 (*)    All other components within normal limits  PHOSPHORUS - Abnormal; Notable for the following components:   Phosphorus 1.8 (*)    All other components within normal limits  TROPONIN I (HIGH SENSITIVITY)  TROPONIN I (HIGH SENSITIVITY)     EKG  My interpretation of the EKG -normal sinus rhythm.  Normal intervals.  No chamber enlargement.  No significant ST elevation or depression.  No signs of acute ischemia or dysrhythmia.  Normal QTc.  No tachycardic or  bradycardic dysrhythmias while on cardiac telemetry.   RADIOLOGY My interpretation of imaging:  CT scan with inflammation around the pancreas.  No obvious abscess.    PROCEDURES:  Critical Care performed: Yes  Procedures CRITICAL CARE Performed by: Nathaniel Man   Total critical care time: 55 minutes  Critical care time was exclusive of separately billable procedures and  treating other patients.  Critical care was necessary to treat or prevent imminent or life-threatening deterioration.  Critical care was time spent personally by me on the following activities: development of treatment plan with patient and/or surrogate as well as nursing, discussions with consultants, evaluation of patient's response to treatment, examination of patient, obtaining history from patient or surrogate, ordering and performing treatments and interventions, ordering and review of laboratory studies, ordering and review of radiographic studies, pulse oximetry and re-evaluation of patient's condition.   Patient's presentation is most consistent with acute presentation with potential threat to life or bodily function.   MEDICATIONS ORDERED IN ED: Medications  sodium chloride 0.9 % bolus 1,000 mL (0 mLs Intravenous Stopped 07/31/22 1734)  potassium chloride 10 mEq in 100 mL IVPB (0 mEq Intravenous Stopped 07/31/22 1756)  fentaNYL (SUBLIMAZE) injection 50 mcg (50 mcg Intravenous Given 07/31/22 1638)  ondansetron (ZOFRAN) injection 4 mg (4 mg Intravenous Given 07/31/22 1648)  magnesium sulfate IVPB 2 g 50 mL (2 g Intravenous New Bag/Given 07/31/22 1735)  pantoprazole (PROTONIX) injection 40 mg (40 mg Intravenous Given 07/31/22 1722)  fentaNYL (SUBLIMAZE) injection 50 mcg (50 mcg Intravenous Given 07/31/22 1755)  sodium chloride 0.9 % bolus 1,000 mL (1,000 mLs Intravenous New Bag/Given 07/31/22 1756)  iohexol (OMNIPAQUE) 300 MG/ML solution 80 mL (80 mLs Intravenous Contrast Given 07/31/22 1809)     FINAL CLINICAL IMPRESSION(S) / ED DIAGNOSES   Final diagnoses:  Hypercalcemia  Alcohol-induced acute pancreatitis without infection or necrosis  Hypomagnesemia  Hypokalemia     Rx / DC Orders   ED Discharge Orders     None        Note:  This document was prepared using Dragon voice recognition software and may include unintentional dictation errors.   Corena Herter, MD 07/31/22 1839

## 2022-08-01 ENCOUNTER — Encounter: Payer: Self-pay | Admitting: Internal Medicine

## 2022-08-01 DIAGNOSIS — N179 Acute kidney failure, unspecified: Secondary | ICD-10-CM | POA: Diagnosis not present

## 2022-08-01 DIAGNOSIS — F1010 Alcohol abuse, uncomplicated: Secondary | ICD-10-CM | POA: Diagnosis not present

## 2022-08-01 DIAGNOSIS — I10 Essential (primary) hypertension: Secondary | ICD-10-CM | POA: Diagnosis not present

## 2022-08-01 DIAGNOSIS — K852 Alcohol induced acute pancreatitis without necrosis or infection: Secondary | ICD-10-CM | POA: Diagnosis not present

## 2022-08-01 LAB — CBC
HCT: 41.1 % (ref 39.0–52.0)
Hemoglobin: 14.2 g/dL (ref 13.0–17.0)
MCH: 36.3 pg — ABNORMAL HIGH (ref 26.0–34.0)
MCHC: 34.5 g/dL (ref 30.0–36.0)
MCV: 105.1 fL — ABNORMAL HIGH (ref 80.0–100.0)
Platelets: 163 10*3/uL (ref 150–400)
RBC: 3.91 MIL/uL — ABNORMAL LOW (ref 4.22–5.81)
RDW: 17 % — ABNORMAL HIGH (ref 11.5–15.5)
WBC: 18.9 10*3/uL — ABNORMAL HIGH (ref 4.0–10.5)
nRBC: 0 % (ref 0.0–0.2)

## 2022-08-01 LAB — COMPREHENSIVE METABOLIC PANEL
ALT: 22 U/L (ref 0–44)
AST: 48 U/L — ABNORMAL HIGH (ref 15–41)
Albumin: 2.7 g/dL — ABNORMAL LOW (ref 3.5–5.0)
Alkaline Phosphatase: 72 U/L (ref 38–126)
Anion gap: 10 (ref 5–15)
BUN: 14 mg/dL (ref 6–20)
CO2: 36 mmol/L — ABNORMAL HIGH (ref 22–32)
Calcium: 11.5 mg/dL — ABNORMAL HIGH (ref 8.9–10.3)
Chloride: 91 mmol/L — ABNORMAL LOW (ref 98–111)
Creatinine, Ser: 1.91 mg/dL — ABNORMAL HIGH (ref 0.61–1.24)
GFR, Estimated: 47 mL/min — ABNORMAL LOW (ref >60)
Glucose, Bld: 115 mg/dL — ABNORMAL HIGH (ref 70–99)
Potassium: 2.9 mmol/L — ABNORMAL LOW (ref 3.5–5.1)
Sodium: 137 mmol/L (ref 135–145)
Total Bilirubin: 1.4 mg/dL — ABNORMAL HIGH (ref 0.3–1.2)
Total Protein: 6 g/dL — ABNORMAL LOW (ref 6.5–8.1)

## 2022-08-01 LAB — FOLATE: Folate: 3.5 ng/mL — ABNORMAL LOW (ref >5.9)

## 2022-08-01 LAB — MAGNESIUM: Magnesium: 1.7 mg/dL (ref 1.7–2.4)

## 2022-08-01 LAB — APTT: aPTT: 31 seconds (ref 24–36)

## 2022-08-01 LAB — VITAMIN B12: Vitamin B-12: 275 pg/mL (ref 180–914)

## 2022-08-01 LAB — PHOSPHORUS: Phosphorus: 5.8 mg/dL — ABNORMAL HIGH (ref 2.5–4.6)

## 2022-08-01 MED ORDER — CLONAZEPAM 0.5 MG PO TABS
0.5000 mg | ORAL_TABLET | Freq: Every day | ORAL | Status: DC | PRN
  Administered 2022-08-01: 0.5 mg via ORAL
  Filled 2022-08-01: qty 1

## 2022-08-01 MED ORDER — PROPRANOLOL HCL 40 MG PO TABS
40.0000 mg | ORAL_TABLET | Freq: Two times a day (BID) | ORAL | Status: DC
  Administered 2022-08-01 – 2022-08-05 (×9): 40 mg via ORAL
  Filled 2022-08-01: qty 2
  Filled 2022-08-01: qty 1
  Filled 2022-08-01: qty 2
  Filled 2022-08-01 (×6): qty 1

## 2022-08-01 MED ORDER — HYDROMORPHONE HCL 1 MG/ML IJ SOLN
1.0000 mg | INTRAMUSCULAR | Status: DC | PRN
  Administered 2022-08-01 – 2022-08-02 (×3): 1 mg via INTRAVENOUS
  Filled 2022-08-01 (×3): qty 1

## 2022-08-01 MED ORDER — POTASSIUM CHLORIDE CRYS ER 20 MEQ PO TBCR
40.0000 meq | EXTENDED_RELEASE_TABLET | ORAL | Status: AC
  Administered 2022-08-01 (×2): 40 meq via ORAL
  Filled 2022-08-01 (×2): qty 2

## 2022-08-01 MED ORDER — POTASSIUM CHLORIDE 10 MEQ/100ML IV SOLN
10.0000 meq | Freq: Once | INTRAVENOUS | Status: AC
  Administered 2022-08-01: 10 meq via INTRAVENOUS

## 2022-08-01 MED ORDER — ALBUTEROL SULFATE (2.5 MG/3ML) 0.083% IN NEBU
2.5000 mg | INHALATION_SOLUTION | Freq: Four times a day (QID) | RESPIRATORY_TRACT | Status: DC | PRN
  Administered 2022-08-01: 2.5 mg via RESPIRATORY_TRACT
  Filled 2022-08-01: qty 3

## 2022-08-01 MED ORDER — FLUTICASONE FUROATE-VILANTEROL 100-25 MCG/ACT IN AEPB
1.0000 | INHALATION_SPRAY | Freq: Every day | RESPIRATORY_TRACT | Status: DC
  Administered 2022-08-05: 1 via RESPIRATORY_TRACT
  Filled 2022-08-01 (×2): qty 28

## 2022-08-01 MED ORDER — ALBUTEROL SULFATE HFA 108 (90 BASE) MCG/ACT IN AERS: 1.0000 | INHALATION_SPRAY | RESPIRATORY_TRACT | Status: DC | PRN

## 2022-08-01 MED ORDER — SUCRALFATE 1 GM/10ML PO SUSP
1.0000 g | Freq: Three times a day (TID) | ORAL | Status: DC
  Administered 2022-08-01 – 2022-08-05 (×15): 1 g via ORAL
  Filled 2022-08-01 (×17): qty 10

## 2022-08-01 MED ORDER — OXYCODONE HCL 5 MG PO TABS
5.0000 mg | ORAL_TABLET | ORAL | Status: DC | PRN
  Administered 2022-08-01 – 2022-08-02 (×3): 5 mg via ORAL
  Filled 2022-08-01 (×3): qty 1

## 2022-08-01 MED ORDER — EZETIMIBE 10 MG PO TABS
10.0000 mg | ORAL_TABLET | Freq: Every day | ORAL | Status: DC
  Administered 2022-08-01 – 2022-08-05 (×5): 10 mg via ORAL
  Filled 2022-08-01 (×5): qty 1

## 2022-08-01 MED ORDER — LACTATED RINGERS IV SOLN: INTRAVENOUS | Status: DC

## 2022-08-01 MED ORDER — MAGNESIUM SULFATE 2 GM/50ML IV SOLN
2.0000 g | Freq: Once | INTRAVENOUS | Status: AC
  Administered 2022-08-01: 2 g via INTRAVENOUS
  Filled 2022-08-01: qty 50

## 2022-08-01 MED ORDER — BEMPEDOIC ACID-EZETIMIBE 180-10 MG PO TABS: 1.0000 | ORAL_TABLET | Freq: Every day | ORAL | Status: DC

## 2022-08-01 NOTE — Assessment & Plan Note (Addendum)
Continue with ezetimibe

## 2022-08-01 NOTE — ED Notes (Signed)
Sleeping, arousable to voice. C/o pain. Relates VS to pain, but also states: "last ETOH Tuesday/Wednesday". Mentions taking "klonopin PRN, and betablocker" neither of which he has had per him. CIWA 3

## 2022-08-01 NOTE — Assessment & Plan Note (Addendum)
Alcohol related pancreatitis.  Patient continue to have abdominal pain, severe in intensity Plan: --cont MIVF@100  --clear liquid as tolerated --d/c IV dilaudid, order IV morphine instead --increase oral to Percocet 2 tabs

## 2022-08-01 NOTE — Assessment & Plan Note (Signed)
Severe GERD Continue with pantoprazole IV and will add sucralfate Clear liquids for now As needed analgesics and antiemetics

## 2022-08-01 NOTE — ED Notes (Signed)
CIWA 6, HR 150-160, Admitting MD notified. Per pharmacy med rec completed last night at 2100.

## 2022-08-01 NOTE — Assessment & Plan Note (Addendum)
Hypokalemia, hypomagnesemia, hypophosphatemia   Renal function with serum cr at 1,9, k is 2,0 and serum bicarbonate at 36. Plan to continue K correction with Kcl 40 meq x2  Continue IV fluids with balanced electrolyte solutions with LR at 100 ml per hr Follow up renal function and electrolytes in am.

## 2022-08-01 NOTE — ED Notes (Signed)
HR 147, CIWA increased to 6. Currently no fluids. K still low. Admitting notified.

## 2022-08-01 NOTE — Assessment & Plan Note (Addendum)
Hold on lisinopril because acute renal failure Continue propanolol for blood pressure control. Close blood pressure monitoring

## 2022-08-01 NOTE — Progress Notes (Signed)
Progress Note   Patient: Justin Powers [id number] DOB: [birth year] DOA: 07/31/2022     1 DOS: the patient was seen and examined on 08/01/2022   Brief hospital course: Mr. Justin Powers was admitted to the hospital with the working diagnosis of acute pancreatitis.   31 yo male with the past medical history of alcohol abuse, iron deficiency anemia, dyslipidemia, GERD and hiatal hernia who presented with chest pain, dyspnea and nausea. Reported 3 days of abdominal pain, associated with nausea and vomiting. Not tolerating po intake. Daily alcohol consumption 2 (16oz) beer daily and his last drink was 4 days prior to hospitalization. On his initial physical examination his blood pressure was 125/100, HR 104, RR 16 and 02 saturation 97% on room air, heart with S1 and S2 present tachycardic and rhythmic, respiratory with no rales or wheezing, abdomen with tenderness to palpation with no guarding, no lower extremity edema.   Na 136, K 2,5 CL 79, bicarbonate 42, glucose 148 bun 12 cr 1,97  P 1,8  Mg 0,8  Lipase 305  ASR 82 ALT 34, T bil 1,6  High sensitive troponin 5 and 6  Wbc 20, hgb 16.4 plt 336  Urine analysis SG 1,015, negative protein, 6-10 wbc   Chest radiograph with no cardiomegaly and no infiltrates.   EKG 110 bpm, normal axis, normal intervals, sinus rhythm with no significant ST segment or T wave changes.  CT abdomen and pelvis with findings compatible with acute pancreatitis, with no evidence of necrosis or fluid collection. Hepatomegaly and fatty infiltration of the liver. Moderate size hiatal hernia. Distal esophageal wall thickening likely related to esophagitis.     Assessment and Plan: * Acute pancreatitis Alcohol related pancreatitis.  Patient continue to have abdominal pain, severe in intensity  Plan to continue supportive medical care.  Resume IV fluids Continue pain control with oral oxycodone and IV hydromorphone Clear liquids for now. Out of bed as tolerated.  Continue  antiacid therapy and as needed antiemetics   AKI (acute kidney injury) ([place]) Hypokalemia, hypomagnesemia, hypophosphatemia   Renal function with serum cr at 1,9, k is 2,0 and serum bicarbonate at 36. Plan to continue K correction with Kcl 40 meq x2  Continue IV fluids with balanced electrolyte solutions with LR at 100 ml per hr Follow up renal function and electrolytes in am.     Alcohol abuse Resume clonazepan as needed Currently patient with no active symptoms of alcohol withdrawal Continue neuro checks, hold on CIWA protocol for now.   Essential hypertension Hold on lisinopril because acute renal failure Continue propanolol for blood pressure control. Close blood pressure monitoring   Hiatal hernia Severe GERD Continue with pantoprazole IV and will add sucralfate Clear liquids for now As needed analgesics and antiemetics   Iron deficiency anemia Follow up cell count.   Mixed hyperlipidemia Continue with ezetimibe         Subjective: Patient continue to have severe abdominal pain, no nausea or vomiting, no dyspnea   Physical Exam: Vitals:   08/01/22 1151 08/01/22 1200 08/01/22 1230 08/01/22 1300  BP: (!) 130/97 (!) 141/87 (!) 142/90 (!) 128/101  Pulse: (!) 160 (!) 168 (!) 162 (!) 136  Resp:  (!) 25 (!) 25 (!) 23  Temp:      TempSrc:      SpO2:  96% 96% 94%  Weight:      Height:       Neurology awake and alert ENT with no pallor or icterus, dry mucous membranes Cardiovascular with  S1 and S2 present and tachycardic Respiratory with no rales or wheezing Abdomen with mild distention and tender to superficial palpation with mild guarding No lower extremity edema   Data Reviewed:    Family Communication: I spoke with patient's mother at the bedside, we talked in detail about patient's condition, plan of care and prognosis and all questions were addressed.   Disposition: Status is: Inpatient Remains inpatient appropriate because: IV fluids and IV  analgesics   Planned Discharge Destination: Home      Author: Tawni Millers, MD 08/01/2022 1:46 PM  For on call review [email or web address].

## 2022-08-01 NOTE — ED Notes (Signed)
Admitting MD at BS.  

## 2022-08-01 NOTE — Hospital Course (Signed)
Mr. Vick was admitted to the hospital with the working diagnosis of acute pancreatitis.   31 yo male with the past medical history of alcohol abuse, iron deficiency anemia, dyslipidemia, GERD and hiatal hernia who presented with chest pain, dyspnea and nausea. Reported 3 days of abdominal pain, associated with nausea and vomiting. Not tolerating po intake. Daily alcohol consumption 2 (16oz) beer daily and his last drink was 4 days prior to hospitalization. On his initial physical examination his blood pressure was 125/100, HR 104, RR 16 and 02 saturation 97% on room air, heart with S1 and S2 present tachycardic and rhythmic, respiratory with no rales or wheezing, abdomen with tenderness to palpation with no guarding, no lower extremity edema.   Na 136, K 2,5 CL 79, bicarbonate 42, glucose 148 bun 12 cr 1,97  P 1,8  Mg 0,8  Lipase 305  ASR 82 ALT 34, T bil 1,6  High sensitive troponin 5 and 6  Wbc 20, hgb 16.4 plt 336  Urine analysis SG 1,015, negative protein, 6-10 wbc   Chest radiograph with no cardiomegaly and no infiltrates.   EKG 110 bpm, normal axis, normal intervals, sinus rhythm with no significant ST segment or T wave changes.  CT abdomen and pelvis with findings compatible with acute pancreatitis, with no evidence of necrosis or fluid collection. Hepatomegaly and fatty infiltration of the liver. Moderate size hiatal hernia. Distal esophageal wall thickening likely related to esophagitis.

## 2022-08-01 NOTE — Assessment & Plan Note (Deleted)
Follow up cell count.

## 2022-08-01 NOTE — Assessment & Plan Note (Signed)
Resume clonazepan as needed Currently patient with no active symptoms of alcohol withdrawal Continue neuro checks, hold on CIWA protocol for now.

## 2022-08-01 NOTE — ED Notes (Addendum)
SPO2 low while sleeping on RA, endorses OSA, returned to 2L [place]. Also endorses feeling bloated, denies nausea, back pain improved. Father at [facility].

## 2022-08-02 DIAGNOSIS — E538 Deficiency of other specified B group vitamins: Secondary | ICD-10-CM

## 2022-08-02 DIAGNOSIS — K852 Alcohol induced acute pancreatitis without necrosis or infection: Secondary | ICD-10-CM | POA: Diagnosis not present

## 2022-08-02 DIAGNOSIS — M549 Dorsalgia, unspecified: Secondary | ICD-10-CM

## 2022-08-02 LAB — CBC
HCT: 39.4 % (ref 39.0–52.0)
Hemoglobin: 13.4 g/dL (ref 13.0–17.0)
MCH: 36.8 pg — ABNORMAL HIGH (ref 26.0–34.0)
MCHC: 34 g/dL (ref 30.0–36.0)
MCV: 108.2 fL — ABNORMAL HIGH (ref 80.0–100.0)
Platelets: 155 10*3/uL (ref 150–400)
RBC: 3.64 MIL/uL — ABNORMAL LOW (ref 4.22–5.81)
RDW: 16.8 % — ABNORMAL HIGH (ref 11.5–15.5)
WBC: 23.9 10*3/uL — ABNORMAL HIGH (ref 4.0–10.5)
nRBC: 0 % (ref 0.0–0.2)

## 2022-08-02 LAB — BASIC METABOLIC PANEL
Anion gap: 8 (ref 5–15)
BUN: 21 mg/dL — ABNORMAL HIGH (ref 6–20)
CO2: 36 mmol/L — ABNORMAL HIGH (ref 22–32)
Calcium: 9.9 mg/dL (ref 8.9–10.3)
Chloride: 91 mmol/L — ABNORMAL LOW (ref 98–111)
Creatinine, Ser: 1.83 mg/dL — ABNORMAL HIGH (ref 0.61–1.24)
GFR, Estimated: 50 mL/min — ABNORMAL LOW (ref >60)
Glucose, Bld: 97 mg/dL (ref 70–99)
Potassium: 3.6 mmol/L (ref 3.5–5.1)
Sodium: 135 mmol/L (ref 135–145)

## 2022-08-02 LAB — URINALYSIS, COMPLETE (UACMP) WITH MICROSCOPIC
Bilirubin Urine: NEGATIVE
Glucose, UA: NEGATIVE mg/dL
Hgb urine dipstick: NEGATIVE
Ketones, ur: NEGATIVE mg/dL
Leukocytes,Ua: NEGATIVE
Nitrite: NEGATIVE
Protein, ur: 100 mg/dL — AB
Specific Gravity, Urine: 1.015 (ref 1.005–1.030)
pH: 8 (ref 5.0–8.0)

## 2022-08-02 LAB — PHOSPHORUS: Phosphorus: 3.1 mg/dL (ref 2.5–4.6)

## 2022-08-02 LAB — MAGNESIUM: Magnesium: 1.4 mg/dL — ABNORMAL LOW (ref 1.7–2.4)

## 2022-08-02 MED ORDER — LIDOCAINE 5 % EX PTCH
2.0000 | MEDICATED_PATCH | CUTANEOUS | Status: DC
  Administered 2022-08-02 – 2022-08-04 (×3): 2 via TRANSDERMAL
  Filled 2022-08-02 (×4): qty 2

## 2022-08-02 MED ORDER — MORPHINE SULFATE (PF) 2 MG/ML IV SOLN
2.0000 mg | INTRAVENOUS | Status: DC | PRN
  Administered 2022-08-02 – 2022-08-04 (×4): 2 mg via INTRAVENOUS
  Filled 2022-08-02 (×4): qty 1

## 2022-08-02 MED ORDER — MAGNESIUM SULFATE 4 GM/100ML IV SOLN
4.0000 g | Freq: Once | INTRAVENOUS | Status: AC
  Administered 2022-08-02: 4 g via INTRAVENOUS
  Filled 2022-08-02: qty 100

## 2022-08-02 MED ORDER — OXYCODONE-ACETAMINOPHEN 5-325 MG PO TABS
1.0000 | ORAL_TABLET | ORAL | Status: DC | PRN
  Administered 2022-08-02 – 2022-08-05 (×9): 2 via ORAL
  Filled 2022-08-02 (×9): qty 2

## 2022-08-02 MED ORDER — CALCIUM CARBONATE ANTACID 500 MG PO CHEW
1.0000 | CHEWABLE_TABLET | Freq: Three times a day (TID) | ORAL | Status: DC | PRN
  Administered 2022-08-02 – 2022-08-03 (×2): 200 mg via ORAL
  Filled 2022-08-02 (×2): qty 1

## 2022-08-02 MED ORDER — METHOCARBAMOL 1000 MG/10ML IJ SOLN
500.0000 mg | Freq: Four times a day (QID) | INTRAVENOUS | Status: DC | PRN
  Administered 2022-08-02 – 2022-08-05 (×4): 500 mg via INTRAVENOUS
  Filled 2022-08-02 (×4): qty 500

## 2022-08-02 MED ORDER — ALUM & MAG HYDROXIDE-SIMETH 200-200-20 MG/5ML PO SUSP
15.0000 mL | Freq: Four times a day (QID) | ORAL | Status: DC | PRN
  Administered 2022-08-02: 15 mL via ORAL
  Filled 2022-08-02: qty 30

## 2022-08-02 NOTE — Assessment & Plan Note (Signed)
--  could be MSK, or referred pain from pancreatitis Plan: --cont opioids pain meds prescribed for pancreatitis --lidocaine patch --trial IV robaxin

## 2022-08-02 NOTE — TOC Initial Note (Signed)
Transition of Care [facility]) - Initial/Assessment Note    Patient Details  Name: Justin Powers MRN: [id number] Date of Birth: [birth year]  Transition of Care [facility]) CM/SW Contact:    Laurena Slimmer, RN Phone Number: 08/02/2022, 4:17 PM  Clinical Narrative:                 Spoke with patient and his mother at the bedside. Patient agreeable to substance abuse resources. Resources provided. He denied any other needs.         Patient Goals and CMS Choice        Expected Discharge Plan and Services                                                Prior Living Arrangements/Services                       Activities of Daily Living Home Assistive Devices/Equipment: None ADL Screening (condition at time of admission) Patient's cognitive ability adequate to safely complete daily activities?: Yes Is the patient deaf or have difficulty hearing?: No Does the patient have difficulty seeing, even when wearing glasses/contacts?: No Does the patient have difficulty concentrating, remembering, or making decisions?: No Patient able to express need for assistance with ADLs?: Yes Does the patient have difficulty dressing or bathing?: No Independently performs ADLs?: Yes (appropriate for developmental age) Does the patient have difficulty walking or climbing stairs?: No Weakness of Legs: None Weakness of Arms/Hands: None  Permission Sought/Granted                  Emotional Assessment              Admission diagnosis:  Hypercalcemia [E83.52] Acute pancreatitis [K85.90] Hypokalemia [E87.6] Hypomagnesemia [E83.42] Alcohol-induced acute pancreatitis without infection or necrosis [K85.20] Patient Active Problem List   Diagnosis Date Noted   AKI (acute kidney injury) ([place]) 08/01/2022   Acute pancreatitis 07/31/2022   Abdominal pain 07/31/2022   Nausea & vomiting 07/31/2022   Hypomagnesemia 07/31/2022   Hypokalemia 07/31/2022   Hypophosphatemia 07/31/2022    Hypercalcemia 07/31/2022   Acute kidney injury superimposed on chronic kidney disease ([place]) 07/31/2022   Essential hypertension 07/31/2022   Mixed hyperlipidemia 07/31/2022   Alcohol abuse 07/31/2022   Elevated MCV 07/31/2022   S/P repair of paraesophageal hernia 04/28/2021   Hiatal hernia 08/19/2020   Stricture and stenosis of esophagus    Dysphagia    Erosive esophagitis    Iron deficiency anemia 10/06/2018   PCP:  Gae Bon, NP Pharmacy:   [id number] [address], [place] [address] Phone: [phone] Fax: [phone]     Social Determinants of Health (SDOH) Interventions    Readmission Risk Interventions     No data to display

## 2022-08-02 NOTE — Plan of Care (Signed)

## 2022-08-02 NOTE — ED Notes (Signed)
Pt requesting pain med for bilateral lower back pain

## 2022-08-02 NOTE — Progress Notes (Signed)
  PROGRESS NOTE    Justin Powers  [id number] DOB: 1991/05/09 DOA: 07/31/2022 PCP: Gae Bon, NP  130A/130A-AA  LOS: 2 days   Brief hospital course:   Assessment & Plan: Justin Powers is a 31 yo male with the past medical history of alcohol abuse, iron deficiency anemia, dyslipidemia, GERD and hiatal hernia who presented with chest pain, dyspnea and nausea. Reported 3 days of abdominal pain, associated with nausea and vomiting. Not tolerating po intake. Daily alcohol consumption 2 (16oz) beer daily and his last drink was 4 days prior to hospitalization.   * Acute pancreatitis Alcohol related pancreatitis.  Patient continue to have abdominal pain, severe in intensity Plan: --cont MIVF@100  --clear liquid as tolerated --d/c IV dilaudid, order IV morphine instead --increase oral to Percocet 2 tabs   AKI (acute kidney injury) (Justin Powers) --Cr 1.97 on presentation.  Baseline around 0.9 --cont MIVF    Alcohol abuse Currently patient with no active symptoms of alcohol withdrawal  Essential hypertension Hold on lisinopril because acute renal failure Continue propanolol for blood pressure control.  Hiatal hernia Severe GERD Continue pantoprazole  --cont sucralfate (new)  Mixed hyperlipidemia Continue with ezetimibe   Back pain --could be MSK, or referred pain from pancreatitis Plan: --cont opioids pain meds prescribed for pancreatitis --lidocaine patch --trial IV robaxin  Folate deficiency --will discharge on folic acid suppl   DVT prophylaxis: Lovenox SQ Code Status: Full code  Family Communication: mother updated at bedside today Level of care: Med-Surg Dispo:   The patient is from: home Anticipated d/c is to: home Anticipated d/c date is: 1-2 days  Subjective and Interval History:  Pt complained of severe abdominal pain.  Also had pain around pelvic area with reported light pink urine.  Also had back pain on both side of mid  spine.   Objective: Vitals:   08/02/22 0832 08/02/22 0957 08/02/22 1657 08/02/22 1940  BP: 130/84  (!) 107/58 113/69  Pulse: 100  94 94  Resp: 17  17 18   Temp: 98 F (36.7 C)  99 F (37.2 C) 98.5 F (36.9 C)  TempSrc:    Oral  SpO2: 95% 95% 96% 92%  Weight:      Height:        Intake/Output Summary (Last 24 hours) at 08/02/2022 2012 Last data filed at 08/02/2022 1801 Gross per 24 hour  Intake 1155.07 ml  Output --  Net 1155.07 ml   Filed Weights   07/31/22 1431  Weight: 90.7 kg    Examination:   Constitutional: NAD, AAOx3 HEENT: conjunctivae and lids normal, EOMI CV: No cyanosis.   RESP: normal respiratory effort, clear lung sounds, on RA Neuro: II - XII grossly intact.     Data Reviewed: I have personally reviewed labs and imaging studies  Time spent: 50 minutes  Enzo Bi, MD [facility] If 7PM-7AM, please contact night-coverage 08/02/2022, 8:12 PM

## 2022-08-02 NOTE — Assessment & Plan Note (Signed)
--  will discharge on folic acid suppl

## 2022-08-03 DIAGNOSIS — F419 Anxiety disorder, unspecified: Secondary | ICD-10-CM

## 2022-08-03 DIAGNOSIS — K852 Alcohol induced acute pancreatitis without necrosis or infection: Secondary | ICD-10-CM | POA: Diagnosis not present

## 2022-08-03 LAB — URINE CULTURE: Culture: NO GROWTH

## 2022-08-03 LAB — CBC
HCT: 37.9 % — ABNORMAL LOW (ref 39.0–52.0)
Hemoglobin: 12.6 g/dL — ABNORMAL LOW (ref 13.0–17.0)
MCH: 35.8 pg — ABNORMAL HIGH (ref 26.0–34.0)
MCHC: 33.2 g/dL (ref 30.0–36.0)
MCV: 107.7 fL — ABNORMAL HIGH (ref 80.0–100.0)
Platelets: 149 10*3/uL — ABNORMAL LOW (ref 150–400)
RBC: 3.52 MIL/uL — ABNORMAL LOW (ref 4.22–5.81)
RDW: 16.9 % — ABNORMAL HIGH (ref 11.5–15.5)
WBC: 15.4 10*3/uL — ABNORMAL HIGH (ref 4.0–10.5)
nRBC: 0 % (ref 0.0–0.2)

## 2022-08-03 LAB — BASIC METABOLIC PANEL
Anion gap: 10 (ref 5–15)
BUN: 24 mg/dL — ABNORMAL HIGH (ref 6–20)
CO2: 34 mmol/L — ABNORMAL HIGH (ref 22–32)
Calcium: 9.2 mg/dL (ref 8.9–10.3)
Chloride: 90 mmol/L — ABNORMAL LOW (ref 98–111)
Creatinine, Ser: 1.52 mg/dL — ABNORMAL HIGH (ref 0.61–1.24)
GFR, Estimated: >60 mL/min (ref >60)
Glucose, Bld: 58 mg/dL — ABNORMAL LOW (ref 70–99)
Potassium: 3.4 mmol/L — ABNORMAL LOW (ref 3.5–5.1)
Sodium: 134 mmol/L — ABNORMAL LOW (ref 135–145)

## 2022-08-03 LAB — MAGNESIUM: Magnesium: 1.9 mg/dL (ref 1.7–2.4)

## 2022-08-03 LAB — LIPASE, BLOOD: Lipase: 84 U/L — ABNORMAL HIGH (ref 11–51)

## 2022-08-03 MED ORDER — POTASSIUM CHLORIDE CRYS ER 20 MEQ PO TBCR
40.0000 meq | EXTENDED_RELEASE_TABLET | Freq: Once | ORAL | Status: AC
  Administered 2022-08-03: 40 meq via ORAL
  Filled 2022-08-03: qty 2

## 2022-08-03 NOTE — Progress Notes (Signed)
  PROGRESS NOTE    Justin Powers  [id number]:[id number] DOB: [birth year] DOA: 07/31/2022 PCP: Gae Bon, NP  130A/130A-AA  LOS: 3 days   Brief hospital course:   Assessment & Plan: Justin Powers is a 31 yo male with the past medical history of alcohol abuse, iron deficiency anemia, dyslipidemia, GERD and hiatal hernia who presented with chest pain, dyspnea and nausea. Reported 3 days of abdominal pain, associated with nausea and vomiting. Not tolerating po intake. Daily alcohol consumption 2 (16oz) beer daily and his last drink was 4 days prior to hospitalization.   * Acute pancreatitis Alcohol related pancreatitis.  Patient continue to have abdominal pain, severe in intensity Plan: --cont MIVF@100  --clear liquid  --IV morphine and Percocet PRN  AKI (acute kidney injury) ([place]) --Cr 1.97 on presentation.  Baseline around 0.9 --cont MIVF    Alcohol abuse Currently patient with no active symptoms of alcohol withdrawal  Essential hypertension Hold on lisinopril because acute renal failure Continue propanolol for blood pressure control.  Hiatal hernia Severe GERD Continue pantoprazole  --cont sucralfate (new)  Mixed hyperlipidemia Continue with ezetimibe   Anxiety --cont home clonazepam PRN  Back pain --could be MSK, or referred pain from pancreatitis Plan: --cont opioids pain meds prescribed for pancreatitis --lidocaine patch --IV robaxin PRN  Folate deficiency --will discharge on folic acid suppl   DVT prophylaxis: Lovenox SQ Code Status: Full code  Family Communication: brother updated at bedside today Level of care: Med-Surg Dispo:   The patient is from: home Anticipated d/c is to: home Anticipated d/c date is: 2-3 days.  Still can not tolerate clear liquid diet.  Subjective and Interval History:  Pt  continued to have abdominal pain and bloating with just sips of broth.     Objective: Vitals:   08/03/22 0910 08/03/22 1645 08/03/22 1948 08/03/22  2114  BP: 121/81 130/80 129/89 116/89  Pulse: 84 86 79 73  Resp: 18 16 17    Temp: 98.2 F (36.8 C) 97.7 F (36.5 C) 97.7 F (36.5 C)   TempSrc:      SpO2: 95% (!) 88% 94%   Weight:      Height:        Intake/Output Summary (Last 24 hours) at 08/03/2022 2218 Last data filed at 08/03/2022 0956 Gross per 24 hour  Intake 1116.71 ml  Output 700 ml  Net 416.71 ml   Filed Weights   07/31/22 1431  Weight: 90.7 kg    Examination:   Constitutional: NAD, AAOx3 HEENT: conjunctivae and lids normal, EOMI CV: No cyanosis.   RESP: normal respiratory effort, on RA Neuro: II - XII grossly intact.   Psych: depressed mood and affect.     Data Reviewed: I have personally reviewed labs and imaging studies  Time spent: 35 minutes  Enzo Bi, MD [facility] If 7PM-7AM, please contact night-coverage 08/03/2022, 10:18 PM

## 2022-08-03 NOTE — Assessment & Plan Note (Signed)
--  cont home clonazepam PRN

## 2022-08-04 DIAGNOSIS — K852 Alcohol induced acute pancreatitis without necrosis or infection: Secondary | ICD-10-CM | POA: Diagnosis not present

## 2022-08-04 LAB — MAGNESIUM: Magnesium: 1.2 mg/dL — ABNORMAL LOW (ref 1.7–2.4)

## 2022-08-04 LAB — CBC
HCT: 33.3 % — ABNORMAL LOW (ref 39.0–52.0)
Hemoglobin: 11.8 g/dL — ABNORMAL LOW (ref 13.0–17.0)
MCH: 37.3 pg — ABNORMAL HIGH (ref 26.0–34.0)
MCHC: 35.4 g/dL (ref 30.0–36.0)
MCV: 105.4 fL — ABNORMAL HIGH (ref 80.0–100.0)
Platelets: 143 10*3/uL — ABNORMAL LOW (ref 150–400)
RBC: 3.16 MIL/uL — ABNORMAL LOW (ref 4.22–5.81)
RDW: 15.6 % — ABNORMAL HIGH (ref 11.5–15.5)
WBC: 11.2 10*3/uL — ABNORMAL HIGH (ref 4.0–10.5)
nRBC: 0 % (ref 0.0–0.2)

## 2022-08-04 LAB — BASIC METABOLIC PANEL
Anion gap: 9 (ref 5–15)
BUN: 20 mg/dL (ref 6–20)
CO2: 32 mmol/L (ref 22–32)
Calcium: 8.2 mg/dL — ABNORMAL LOW (ref 8.9–10.3)
Chloride: 94 mmol/L — ABNORMAL LOW (ref 98–111)
Creatinine, Ser: 1.14 mg/dL (ref 0.61–1.24)
GFR, Estimated: >60 mL/min (ref >60)
Glucose, Bld: 71 mg/dL (ref 70–99)
Potassium: 3.2 mmol/L — ABNORMAL LOW (ref 3.5–5.1)
Sodium: 135 mmol/L (ref 135–145)

## 2022-08-04 MED ORDER — POTASSIUM CHLORIDE 20 MEQ PO PACK
40.0000 meq | PACK | Freq: Once | ORAL | Status: AC
  Administered 2022-08-04: 40 meq via ORAL
  Filled 2022-08-04: qty 2

## 2022-08-04 MED ORDER — TRAZODONE HCL 50 MG PO TABS
50.0000 mg | ORAL_TABLET | Freq: Every evening | ORAL | Status: DC | PRN
  Administered 2022-08-04: 50 mg via ORAL
  Filled 2022-08-04: qty 1

## 2022-08-04 MED ORDER — VITAMIN B-12 1000 MCG PO TABS
1000.0000 ug | ORAL_TABLET | Freq: Every day | ORAL | Status: DC
  Administered 2022-08-04 – 2022-08-05 (×2): 1000 ug via ORAL
  Filled 2022-08-04 (×2): qty 1

## 2022-08-04 MED ORDER — MAGNESIUM SULFATE 4 GM/100ML IV SOLN
4.0000 g | Freq: Once | INTRAVENOUS | Status: AC
  Administered 2022-08-04: 4 g via INTRAVENOUS
  Filled 2022-08-04: qty 100

## 2022-08-04 MED ORDER — FOLIC ACID 1 MG PO TABS
1.0000 mg | ORAL_TABLET | Freq: Every day | ORAL | Status: DC
  Administered 2022-08-04 – 2022-08-05 (×2): 1 mg via ORAL
  Filled 2022-08-04 (×2): qty 1

## 2022-08-04 NOTE — Progress Notes (Signed)
[facility]  - [place] [facility]   PATIENT NAME: Justin Powers    MR#:  [id number]  DATE OF BIRTH:  03-Nov-1990  SUBJECTIVE:  patient complains of abdominal pain. He is having difficulty sleeping in the hospital. Discussed with him about PRN trazodone she wants to try to my. Recommended/encouraged to continue clear liquids. No family at bedside    VITALS:  Blood pressure 124/86, pulse 83, temperature 98.7 F (37.1 C), resp. rate 17, height 5\' 7"  (1.702 m), weight 90.7 kg, SpO2 95 %.  PHYSICAL EXAMINATION:   GENERAL:  31 y.o.-year-old patient lying in the bed with no acute distress.  LUNGS: Normal breath sounds bilaterally, no wheezing CARDIOVASCULAR: S1, S2 normal. No murmurs,   ABDOMEN: Soft,tender, nondistended. Bowel sounds present.  EXTREMITIES: No  edema b/l.    NEUROLOGIC: nonfocal  patient is alert and awake SKIN: No obvious rash, lesion, or ulcer.   LABORATORY PANEL:  CBC Recent Labs  Lab 08/04/22 0331  WBC 11.2*  HGB 11.8*  HCT 33.3*  PLT 143*    Chemistries  Recent Labs  Lab 08/01/22 0502 08/02/22 0559 08/04/22 0331  NA 137   < > 135  K 2.9*   < > 3.2*  CL 91*   < > 94*  CO2 36*   < > 32  GLUCOSE 115*   < > 71  BUN 14   < > 20  CREATININE 1.91*   < > 1.14  CALCIUM 11.5*   < > 8.2*  MG 1.7   < > 1.2*  AST 48*  --   --   ALT 22  --   --   ALKPHOS 72  --   --   BILITOT 1.4*  --   --    < > = values in this interval not displayed.    Assessment and Plan  Justin Powers is a 31 yo male with the past medical history of alcohol abuse, iron deficiency anemia, dyslipidemia, GERD and hiatal hernia who presented with chest pain, dyspnea and nausea. Reported 3 days of abdominal pain, associated with nausea and vomiting. Not tolerating po intake. Daily alcohol consumption 2 (16oz) beer daily and his last drink was 4 days prior to hospitalization.   Acute pancreatitis Alcohol related pancreatitis.  Patient continue to have abdominal  pain, severe in intensity --cont MIVF@100  --clear liquid  --IV morphine and Percocet PRN   AKI (acute kidney injury) ([facility]) electrolyte abnormality (magnesium, low potassium) --Cr 1.97 on presentation.  Baseline around 0.9 --cont MIVF -- pharmacy to replete   Alcohol abuse Currently patient with no active symptoms of alcohol withdrawal   Essential hypertension --Hold on lisinopril because acute renal failure --Continue propanolol for blood pressure control.   Hiatal hernia --Severe GERD --Continue pantoprazole  --cont sucralfate (new)   Mixed hyperlipidemia --Continue with ezetimibe    Anxiety --cont home clonazepam PRN   Back pain --could be MSK, or referred pain from pancreatitis --cont opioids pain meds prescribed for pancreatitis --lidocaine patch --IV robaxin PRN   Folate deficiency --will discharge on folic acid suppl     DVT prophylaxis: Lovenox SQ Code Status: Full code  Family Communication: family events level of care: Med-Surg Dispo:   The patient is from: home Anticipated d/c is to: home Anticipated d/c date is: 2-3 days.  Still can not tolerate clear liquid diet.       TOTAL TIME TAKING CARE OF THIS PATIENT: 35 minutes.  >50% time spent on counselling and  coordination of care  Note: This dictation was prepared with Dragon dictation along with smaller phrase technology. Any transcriptional errors that result from this process are unintentional.  Fritzi Mandes M.D    [facility]   CC: Primary care physician; Gae Bon, NP

## 2022-08-05 DIAGNOSIS — I10 Essential (primary) hypertension: Secondary | ICD-10-CM | POA: Diagnosis not present

## 2022-08-05 DIAGNOSIS — F1010 Alcohol abuse, uncomplicated: Secondary | ICD-10-CM | POA: Diagnosis not present

## 2022-08-05 DIAGNOSIS — N179 Acute kidney failure, unspecified: Secondary | ICD-10-CM | POA: Diagnosis not present

## 2022-08-05 DIAGNOSIS — K852 Alcohol induced acute pancreatitis without necrosis or infection: Secondary | ICD-10-CM | POA: Diagnosis not present

## 2022-08-05 LAB — BASIC METABOLIC PANEL
Anion gap: 9 (ref 5–15)
BUN: 11 mg/dL (ref 6–20)
CO2: 29 mmol/L (ref 22–32)
Calcium: 7.6 mg/dL — ABNORMAL LOW (ref 8.9–10.3)
Chloride: 97 mmol/L — ABNORMAL LOW (ref 98–111)
Creatinine, Ser: 0.81 mg/dL (ref 0.61–1.24)
GFR, Estimated: >60 mL/min (ref >60)
Glucose, Bld: 88 mg/dL (ref 70–99)
Potassium: 3.2 mmol/L — ABNORMAL LOW (ref 3.5–5.1)
Sodium: 135 mmol/L (ref 135–145)

## 2022-08-05 LAB — MAGNESIUM: Magnesium: 1.7 mg/dL (ref 1.7–2.4)

## 2022-08-05 MED ORDER — FOLIC ACID 1 MG PO TABS: 1.0000 mg | ORAL_TABLET | Freq: Every day | ORAL | 1 refills | Status: DC

## 2022-08-05 MED ORDER — PANTOPRAZOLE SODIUM 40 MG PO TBEC: 40.0000 mg | DELAYED_RELEASE_TABLET | Freq: Every day | ORAL | 0 refills | Status: DC

## 2022-08-05 MED ORDER — POTASSIUM CHLORIDE CRYS ER 20 MEQ PO TBCR
40.0000 meq | EXTENDED_RELEASE_TABLET | ORAL | Status: DC
  Administered 2022-08-05: 40 meq via ORAL
  Filled 2022-08-05: qty 2

## 2022-08-05 MED ORDER — MAGNESIUM SULFATE 2 GM/50ML IV SOLN
2.0000 g | Freq: Once | INTRAVENOUS | Status: AC
  Administered 2022-08-05: 2 g via INTRAVENOUS
  Filled 2022-08-05: qty 50

## 2022-08-05 MED ORDER — OXYCODONE-ACETAMINOPHEN 5-325 MG PO TABS: 1.0000 | ORAL_TABLET | Freq: Three times a day (TID) | ORAL | 0 refills | Status: DC | PRN

## 2022-08-05 MED ORDER — PANTOPRAZOLE SODIUM 40 MG PO TBEC: 40.0000 mg | DELAYED_RELEASE_TABLET | Freq: Every day | ORAL | Status: DC

## 2022-08-05 NOTE — Discharge Summary (Signed)
Physician Discharge Summary   Patient: Justin Powers MRN: [id number] DOB: 1991-06-29  Admit date:     07/31/2022  Discharge date: 08/05/22  Discharge Physician: Enedina Finner   PCP: Franciso Bend, NP   Recommendations at discharge:   follow-up PCP in 1 to 2 weeks  Discharge Diagnoses: Principal Problem:   Acute pancreatitis Active Problems:   Alcohol abuse   AKI (acute kidney injury) (HCC)   Essential hypertension   Hiatal hernia   History of iron deficiency   Mixed hyperlipidemia   Folate deficiency   Back pain   Anxiety  Hospital Course:  Justin Powers is a 31 yo male with the past medical history of alcohol abuse, iron deficiency anemia, dyslipidemia, GERD and hiatal hernia who presented with chest pain, dyspnea and nausea. Reported 3 days of abdominal pain, associated with nausea and vomiting. Not tolerating po intake. Daily alcohol consumption 2 (16oz) beer daily and his last drink was 4 days prior to hospitalization.   Acute pancreatitis Alcohol related pancreatitis.  Patient continue to have abdominal pain, severe in intensity -- received IV fluids. -- Patient tolerating full liquid diet -- slept better last night. Abdominal pain improving. -- PRN narcotics --- discussed with patient to abstain from alcohol   AKI (acute kidney injury) (HCC) electrolyte abnormality (magnesium, low potassium) --Cr 1.97 on presentation.  Baseline around 0.9 --cont MIVF -- pharmacy to replete   Alcohol abuse Currently patient with no active symptoms of alcohol withdrawal   Essential hypertension -resume lisinopril and continue propranolol   Hiatal hernia --Severe GERD --Continue pantoprazole    Mixed hyperlipidemia --Continue with ezetimibe    Anxiety --cont home clonazepam PRN   Back pain --could be MSK, or referred pain from pancreatitis --cont opioids pain meds prescribed for pancreatitis --lidocaine patch   Folate deficiency --will discharge on folic acid  suppl     DVT prophylaxis: Lovenox SQ Code Status: Full code  Family Communication: family at bedside events level of care: Med-Surg Dispo:   The patient is from: home Anticipated d/c is to: home today Pt agreeable     Pain control - [place] Controlled Substance Reporting System database was reviewed. and patient was instructed, not to drive, operate heavy machinery, perform activities at heights, swimming or participation in water activities or provide baby-sitting services while on Pain, Sleep and Anxiety Medications; until their outpatient Physician has advised to do so again. Also recommended to not to take more than prescribed Pain, Sleep and Anxiety Medications.  Consultants: none Procedures performed: none  Disposition: Home Diet recommendation:  Discharge Diet Orders (From admission, onward)     Start     Ordered   08/05/22 0000  Diet - low sodium heart healthy        08/05/22 1410           Cardiac diet DISCHARGE MEDICATION: Allergies as of 08/05/2022   No Known Allergies      Medication List     STOP taking these medications    hydrochlorothiazide 25 MG tablet Commonly known as: HYDRODIURIL       TAKE these medications    albuterol 108 (90 Base) MCG/ACT inhaler Commonly known as: VENTOLIN HFA Inhale 1-2 puffs into the lungs every 4 (four) hours as needed for wheezing or shortness of breath.   atorvastatin 20 MG tablet Commonly known as: LIPITOR Take 20 mg by mouth daily.   Breo Ellipta 100-25 MCG/ACT Aepb Generic drug: fluticasone furoate-vilanterol Inhale 1 puff into the lungs daily.  clonazePAM 0.5 MG tablet Commonly known as: KLONOPIN Take 0.5 mg by mouth daily as needed.   folic acid 1 MG tablet Commonly known as: FOLVITE Take 1 tablet (1 mg total) by mouth daily. Start taking on: August 06, 2022   lisinopril 10 MG tablet Commonly known as: ZESTRIL Take 10 mg by mouth daily.   Nexlizet 180-10 MG Tabs Generic drug:  Bempedoic Acid-Ezetimibe Take 1 tablet by mouth daily.   ondansetron 8 MG tablet Commonly known as: ZOFRAN Take by mouth.   oxyCODONE-acetaminophen 5-325 MG tablet Commonly known as: PERCOCET/ROXICET Take 1 tablet by mouth every 8 (eight) hours as needed for moderate pain or severe pain.   pantoprazole 40 MG tablet Commonly known as: PROTONIX Take 1 tablet (40 mg total) by mouth daily. Start taking on: August 06, 2022   propranolol 40 MG tablet Commonly known as: INDERAL Take 40 mg by mouth 2 (two) times daily.        Follow-up Information     Franciso Bend, NP. Schedule an appointment as soon as possible for a visit in 1 week(s).   Specialty: Nurse Practitioner Why: hospital f/u Contact information: [address] [place] [address] [phone]                Discharge Exam: Filed Weights   07/31/22 1431  Weight: 90.7 kg     Condition at discharge: fair  The results of significant diagnostics from this hospitalization (including imaging, microbiology, ancillary and laboratory) are listed below for reference.   Imaging Studies: US Abdomen Limited  Result Date: 07/31/2022 CLINICAL DATA:  History of acute pancreatitis EXAM: ULTRASOUND ABDOMEN LIMITED RIGHT UPPER QUADRANT COMPARISON:  CT from earlier in the same day FINDINGS: Gallbladder: No gallstones or wall thickening visualized. No sonographic Murphy sign noted by sonographer. Common bile duct: Diameter: 2.7 mm. Liver: Diffusely increased in echogenicity without focal mass. The liver is mildly prominent similar to that seen on recent CT examination. Portal vein is patent on color Doppler imaging with normal direction of blood flow towards the liver. Other: Increased echogenicity is noted within the pancreas consistent with the acute pancreatitis seen on recent CT. IMPRESSION: Fatty infiltration of the liver. Mild hepatomegaly. Increased echogenicity within the pancreas consistent with the given  clinical history of pancreatitis. Electronically Signed   By: Alcide Clever M.D.   On: 07/31/2022 22:40   CT ABDOMEN PELVIS W CONTRAST  Result Date: 07/31/2022 CLINICAL DATA:  Epigastric pain EXAM: CT ABDOMEN AND PELVIS WITH CONTRAST TECHNIQUE: Multidetector CT imaging of the abdomen and pelvis was performed using the standard protocol following bolus administration of intravenous contrast. RADIATION DOSE REDUCTION: This exam was performed according to the departmental dose-optimization program which includes automated exposure control, adjustment of the mA and/or kV according to patient size and/or use of iterative reconstruction technique. CONTRAST:  64mL OMNIPAQUE IOHEXOL 300 MG/ML  SOLN COMPARISON:  None Available. FINDINGS: Lower chest: No acute abnormality. Hepatobiliary: The liver is enlarged. There is diffuse fatty infiltration of the liver. Gallbladder and bile ducts are within normal limits. Pancreas: There is a small to moderate amount of fluid and stranding surrounding the pancreas. No ductal dilatation or fluid collection. Pancreas enhances normally. Spleen: Normal in size without focal abnormality. Adrenals/Urinary Tract: Adrenal glands are unremarkable. Kidneys are normal, without renal calculi, focal lesion, or hydronephrosis. Bladder is unremarkable. Stomach/Bowel: There is some distal esophageal wall thickening. There is a moderate-sized hiatal hernia. Stomach is otherwise within normal limits. Appendix appears normal. No evidence  of bowel wall thickening, distention, or inflammatory changes. Vascular/Lymphatic: No significant vascular findings are present. No enlarged abdominal or pelvic lymph nodes. Reproductive: Prostate is unremarkable. Other: There is no ascites or free air. There is no focal abdominal wall hernia. Musculoskeletal: No acute or significant osseous findings. IMPRESSION: 1. Findings compatible with acute pancreatitis. No evidence for pancreatic necrosis or fluid collection.  2. Hepatomegaly and fatty infiltration of the liver. 3. Moderate-sized hiatal hernia. 4. Distal esophageal wall thickening, likely related to esophagitis. Electronically Signed   By: Ronney Asters M.D.   On: 07/31/2022 18:27   DG Chest 2 View  Result Date: 07/31/2022 CLINICAL DATA:  Chest pain EXAM: CHEST - 2 VIEW COMPARISON:  Chest radiograph dated August 29, 2018. FINDINGS: The heart size and mediastinal contours are within normal limits. Both lungs are clear. Mild thoracic kyphosis. No acute osseous abnormality. IMPRESSION: No active cardiopulmonary disease. Electronically Signed   By: Keane Police D.O.   On: 07/31/2022 14:50    Microbiology: Results for orders placed or performed during the hospital encounter of 07/31/22  Urine Culture     Status: None   Collection Time: 08/02/22  5:30 PM   Specimen: Urine, Random  Result Value Ref Range Status   Specimen Description   Final    URINE, RANDOM Performed at [facility], [address] [place], [place] [address]    Special Requests   Final    NONE Performed at [facility], [address] [place], [place] [address]    Culture   Final    NO GROWTH Performed at [facility], [place] [address], [place], [place] [address]    Report Status 08/03/2022 FINAL  Final    Labs: CBC: Recent Labs  Lab 07/31/22 1518 07/31/22 2241 08/01/22 0502 08/02/22 0559 08/03/22 0444 08/04/22 0331  WBC 20.9* 14.5* 18.9* 23.9* 15.4* 11.2*  NEUTROABS 17.1*  --   --   --   --   --   HGB 16.4 13.5 14.2 13.4 12.6* 11.8*  HCT 47.6 38.3* 41.1 39.4 37.9* 33.3*  MCV 104.2* 105.5* 105.1* 108.2* 107.7* 105.4*  PLT 336 194 163 155 149* 518*   Basic Metabolic Panel: Recent Labs  Lab 07/31/22 1633 07/31/22 2241 08/01/22 0502 08/02/22 0559 08/03/22 0444 08/04/22 0331 08/05/22 0522  NA  --   --  137 135 134* 135 135  K  --   --  2.9* 3.6 3.4* 3.2* 3.2*  CL  --   --  91* 91* 90* 94* 97*  CO2  --   --  36* 36* 34* 32 29   GLUCOSE  --   --  115* 97 58* 71 88  BUN  --   --  14 21* 24* 20 11  CREATININE  --    < > 1.91* 1.83* 1.52* 1.14 0.81  CALCIUM  --   --  11.5* 9.9 9.2 8.2* 7.6*  MG  --   --  1.7 1.4* 1.9 1.2* 1.7  PHOS 1.8*  --  5.8* 3.1  --   --   --    < > = values in this interval not displayed.   Liver Function Tests: Recent Labs  Lab 07/31/22 1518 08/01/22 0502  AST 82* 48*  ALT 34 22  ALKPHOS 106 72  BILITOT 1.6* 1.4*  PROT 8.5* 6.0*  ALBUMIN 3.8 2.7*   CBG: No results for input(s): "GLUCAP" in the last 168 hours.  Discharge time spent: greater than 30  minutes.  Signed: Enedina Finner, MD [facility] 08/05/2022

## 2022-08-05 NOTE — Progress Notes (Signed)
Pt decline wheelchair to front entrance. Pt d/c ambulatory.

## 2022-08-05 NOTE — Discharge Instructions (Signed)
Abstain from drinking alcohol °

## 2022-08-16 ENCOUNTER — Ambulatory Visit (INDEPENDENT_AMBULATORY_CARE_PROVIDER_SITE_OTHER): Payer: 59 | Admitting: Gastroenterology

## 2022-08-16 ENCOUNTER — Encounter: Payer: Self-pay | Admitting: Gastroenterology

## 2022-08-16 ENCOUNTER — Other Ambulatory Visit: Payer: Self-pay | Admitting: Gastroenterology

## 2022-08-16 VITALS — BP 130/99 | HR 87 | Temp 98.8°F | Ht 67.0 in | Wt 199.2 lb

## 2022-08-16 DIAGNOSIS — F1011 Alcohol abuse, in remission: Secondary | ICD-10-CM | POA: Diagnosis not present

## 2022-08-16 DIAGNOSIS — K449 Diaphragmatic hernia without obstruction or gangrene: Secondary | ICD-10-CM

## 2022-08-16 DIAGNOSIS — K219 Gastro-esophageal reflux disease without esophagitis: Secondary | ICD-10-CM | POA: Diagnosis not present

## 2022-08-16 DIAGNOSIS — Z8719 Personal history of other diseases of the digestive system: Secondary | ICD-10-CM | POA: Diagnosis not present

## 2022-08-16 MED ORDER — PANTOPRAZOLE SODIUM 40 MG PO TBEC: 40.0000 mg | DELAYED_RELEASE_TABLET | Freq: Every day | ORAL | 2 refills | Status: DC

## 2022-08-16 NOTE — Progress Notes (Signed)
[name] MD, MRCP(U.K) [address]  [address]  [place], [place] [address]  Main: [phone]  Fax: [phone]   Primary Care Physician: Franciso Bend, NP  Primary Gastroenterologist:  Dr. [name]   Chief Complaint  Patient presents with   Follow-up    HPI: Justin Powers is a 31 y.o. male  Summary of history :     He was initially referred and seen in January 2020 for iron deficiency anemia by Dr. Cathie Hoops.  At that point he also had a bit of rectal bleeding.  H. pylori breath test was negative.  TTG antibody was positive.  Treated for an infectious diarrhea secondary to adenovirus.  He underwent an EGD in January 2020 which noted a large hiatal hernia and severe esophagitis.  Biopsies of the duodenum did not demonstrate any villous blunting.  A colonoscopy was also performed at that point of time with random colon biopsies were normal.  I referred him to [facility] to obtain repair of his hiatal hernia. Treated in March 2020 for IBS-D with Xifaxan which helped the diarrhea.   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   11/22/2019: Iron low at 29 percentage saturation 6.  Ferritin 13.5.  Hemoglobin 14.4 g.  12/18/2020 : EGD:LA grade B esophagitis 4 cm hiatal hernia toupee fundoplication performed.    03/16/2022: Right upper quadrant ultrasound: Moderate hepatic steatosis.  03/04/2022: Not immune to hep A B- for viral hepatitis B and C.  HIV negative, smooth muscle antibody positive at 43 ceruloplasmin normal ferritin 400 iron saturation 80 celiac serology negative hemochromatosis gene not detected.  Interval history 04/05/2022-08/16/2022   08/05/2022: Admitted with acute alcohol induced pancreatitis , AKI. Discharged home   Since  discharge he has stopped drinking alcohol.  He had significant edema of his limbs which has resolved after discharge.  He is taking Protonix for his reflux which has helped him significantly doing well.  No new complaints.  No abdominal pain.   He has not scheduled appointment with [facility] for repair of his hiatal hernia.  Has previously attended alcoholic Anonymous sessions.  Current Outpatient Medications  Medication Sig Dispense Refill   albuterol (VENTOLIN HFA) 108 (90 Base) MCG/ACT inhaler Inhale 1-2 puffs into the lungs every 4 (four) hours as needed for wheezing or shortness of breath.     folic acid (FOLVITE) 1 MG tablet Take 1 tablet (1 mg total) by mouth daily. 30 tablet 1   lisinopril (ZESTRIL) 10 MG tablet Take 10 mg by mouth daily.     ondansetron (ZOFRAN) 8 MG tablet Take by mouth.     pantoprazole (PROTONIX) 40 MG tablet Take 1 tablet (40 mg total) by mouth daily. 30 tablet 0   propranolol (INDERAL) 40 MG tablet Take 40 mg by mouth 2 (two) times daily.     atorvastatin (LIPITOR) 20 MG tablet Take 20 mg by mouth daily. (Patient not taking: Reported on 08/16/2022)     BREO ELLIPTA 100-25 MCG/INH AEPB Inhale 1 puff into the lungs daily. (Patient not taking: Reported on 08/16/2022)     clonazePAM (KLONOPIN) 0.5 MG tablet Take 0.5 mg by mouth daily as needed. (Patient not taking: Reported on 08/16/2022)     NEXLIZET 180-10 MG TABS Take 1 tablet by  mouth daily. (Patient not taking: Reported on 08/16/2022)     oxyCODONE-acetaminophen (PERCOCET/ROXICET) 5-325 MG tablet Take 1 tablet by mouth every 8 (eight) hours as needed for moderate pain or severe pain. (Patient not taking: Reported on 08/16/2022) 10 tablet 0   No current facility-administered medications for this visit.    Allergies as of 08/16/2022   (No Known Allergies)    ROS:  General: Negative for anorexia, weight loss, fever, chills, fatigue, weakness. ENT: Negative for hoarseness, difficulty swallowing , nasal  congestion. CV: Negative for chest pain, angina, palpitations, dyspnea on exertion, peripheral edema.  Respiratory: Negative for dyspnea at rest, dyspnea on exertion, cough, sputum, wheezing.  GI: See history of present illness. GU:  Negative for dysuria, hematuria, urinary incontinence, urinary frequency, nocturnal urination.  Endo: Negative for unusual weight change.    Physical Examination:   BP (!) 130/99   Pulse 87   Temp 98.8 F (37.1 C) (Oral)   Ht 5\' 7"  (1.702 m)   Wt 199 lb 4 oz (90.4 kg)   BMI 31.21 kg/m   General: Well-nourished, well-developed in no acute distress.  Eyes: No icterus. Conjunctivae pink. Extremities: No lower extremity edema. No clubbing or deformities. Neuro: Alert and oriented x 3.  Grossly intact. Skin: Warm and dry, no jaundice.   Psych: Alert and cooperative, normal mood and affect.   Imaging Studies: US Abdomen Limited  Result Date: 07/31/2022 CLINICAL DATA:  History of acute pancreatitis EXAM: ULTRASOUND ABDOMEN LIMITED RIGHT UPPER QUADRANT COMPARISON:  CT from earlier in the same day FINDINGS: Gallbladder: No gallstones or wall thickening visualized. No sonographic Murphy sign noted by sonographer. Common bile duct: Diameter: 2.7 mm. Liver: Diffusely increased in echogenicity without focal mass. The liver is mildly prominent similar to that seen on recent CT examination. Portal vein is patent on color Doppler imaging with normal direction of blood flow towards the liver. Other: Increased echogenicity is noted within the pancreas consistent with the acute pancreatitis seen on recent CT. IMPRESSION: Fatty infiltration of the liver. Mild hepatomegaly. Increased echogenicity within the pancreas consistent with the given clinical history of pancreatitis. Electronically Signed   By: Inez Catalina M.D.   On: 07/31/2022 22:40   CT ABDOMEN PELVIS W CONTRAST  Result Date: 07/31/2022 CLINICAL DATA:  Epigastric pain EXAM: CT ABDOMEN AND PELVIS WITH CONTRAST  TECHNIQUE: Multidetector CT imaging of the abdomen and pelvis was performed using the standard protocol following bolus administration of intravenous contrast. RADIATION DOSE REDUCTION: This exam was performed according to the departmental dose-optimization program which includes automated exposure control, adjustment of the mA and/or kV according to patient size and/or use of iterative reconstruction technique. CONTRAST:  4mL OMNIPAQUE IOHEXOL 300 MG/ML  SOLN COMPARISON:  None Available. FINDINGS: Lower chest: No acute abnormality. Hepatobiliary: The liver is enlarged. There is diffuse fatty infiltration of the liver. Gallbladder and bile ducts are within normal limits. Pancreas: There is a small to moderate amount of fluid and stranding surrounding the pancreas. No ductal dilatation or fluid collection. Pancreas enhances normally. Spleen: Normal in size without focal abnormality. Adrenals/Urinary Tract: Adrenal glands are unremarkable. Kidneys are normal, without renal calculi, focal lesion, or hydronephrosis. Bladder is unremarkable. Stomach/Bowel: There is some distal esophageal wall thickening. There is a moderate-sized hiatal hernia. Stomach is otherwise within normal limits. Appendix appears normal. No evidence of bowel wall thickening, distention, or inflammatory changes. Vascular/Lymphatic: No significant vascular findings are present. No enlarged abdominal or pelvic lymph nodes. Reproductive: Prostate is unremarkable. Other: There is  no ascites or free air. There is no focal abdominal wall hernia. Musculoskeletal: No acute or significant osseous findings. IMPRESSION: 1. Findings compatible with acute pancreatitis. No evidence for pancreatic necrosis or fluid collection. 2. Hepatomegaly and fatty infiltration of the liver. 3. Moderate-sized hiatal hernia. 4. Distal esophageal wall thickening, likely related to esophagitis. Electronically Signed   By: Darliss Cheney M.D.   On: 07/31/2022 18:27   DG Chest 2  View  Result Date: 07/31/2022 CLINICAL DATA:  Chest pain EXAM: CHEST - 2 VIEW COMPARISON:  Chest radiograph dated August 29, 2018. FINDINGS: The heart size and mediastinal contours are within normal limits. Both lungs are clear. Mild thoracic kyphosis. No acute osseous abnormality. IMPRESSION: No active cardiopulmonary disease. Electronically Signed   By: Larose Hires D.O.   On: 07/31/2022 14:50    Assessment and Plan:   Justin Powers is a 31 y.o. y/o male with a longstanding history of GERD and severe esophagitis.  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  He underwent toupee surgery at [facility] which has failed and was recommended repeat surgery which she is thinking about.  In the interim was found to have elevated transaminases.  Very likely secondary to excess consumption of alcohol.  Autoimmune screen has been negative except a positive smooth muscle antibody which could be false positive .  Recent admission for alcohol induced acute pancreatitis.  Presently has stopped taking alcohol.   Plan 1.  Positive smooth muscle antibody, possibly nonspecific versus false positive I will recheck in 3 to 4 months time when he has cut down on alcohol consumption or stopped 2.  Stop all alcohol consumption  3.  Alcoholic hepatosteatosis: Stay off alcohol 4.  Doing well with his reflux on pantoprazole 40 mg once a day requests a refill we will provide for 3 months     Dr [name]  MD,MRCP [facility]) Follow up in 4 to 5 months

## 2022-08-31 ENCOUNTER — Other Ambulatory Visit: Payer: Self-pay

## 2022-08-31 ENCOUNTER — Inpatient Hospital Stay: Payer: 59 | Attending: Oncology

## 2022-08-31 DIAGNOSIS — R7401 Elevation of levels of liver transaminase levels: Secondary | ICD-10-CM

## 2022-08-31 DIAGNOSIS — E611 Iron deficiency: Secondary | ICD-10-CM

## 2022-09-02 ENCOUNTER — Inpatient Hospital Stay: Payer: 59 | Admitting: Oncology

## 2022-09-02 ENCOUNTER — Inpatient Hospital Stay: Payer: 59

## 2022-09-06 ENCOUNTER — Ambulatory Visit: Payer: 59 | Admitting: Gastroenterology

## 2022-09-28 MED FILL — Iron Sucrose Inj 20 MG/ML (Fe Equiv): INTRAVENOUS | Qty: 10 | Status: AC

## 2022-09-29 ENCOUNTER — Inpatient Hospital Stay: Payer: 59 | Attending: Oncology

## 2022-09-29 ENCOUNTER — Inpatient Hospital Stay: Payer: 59

## 2022-09-29 ENCOUNTER — Inpatient Hospital Stay (HOSPITAL_BASED_OUTPATIENT_CLINIC_OR_DEPARTMENT_OTHER): Payer: 59 | Admitting: Oncology

## 2022-09-29 ENCOUNTER — Encounter: Payer: Self-pay | Admitting: Oncology

## 2022-09-29 ENCOUNTER — Other Ambulatory Visit: Payer: Self-pay

## 2022-09-29 ENCOUNTER — Encounter: Payer: Self-pay | Admitting: *Deleted

## 2022-09-29 VITALS — BP 127/85 | HR 51 | Temp 98.2°F | Resp 16 | Wt 196.3 lb

## 2022-09-29 DIAGNOSIS — E611 Iron deficiency: Secondary | ICD-10-CM | POA: Insufficient documentation

## 2022-09-29 DIAGNOSIS — D751 Secondary polycythemia: Secondary | ICD-10-CM | POA: Insufficient documentation

## 2022-09-29 DIAGNOSIS — Z807 Family history of other malignant neoplasms of lymphoid, hematopoietic and related tissues: Secondary | ICD-10-CM | POA: Diagnosis not present

## 2022-09-29 DIAGNOSIS — Z803 Family history of malignant neoplasm of breast: Secondary | ICD-10-CM | POA: Diagnosis not present

## 2022-09-29 DIAGNOSIS — Z8052 Family history of malignant neoplasm of bladder: Secondary | ICD-10-CM | POA: Diagnosis not present

## 2022-09-29 DIAGNOSIS — Z8719 Personal history of other diseases of the digestive system: Secondary | ICD-10-CM | POA: Diagnosis not present

## 2022-09-29 DIAGNOSIS — K219 Gastro-esophageal reflux disease without esophagitis: Secondary | ICD-10-CM | POA: Diagnosis not present

## 2022-09-29 DIAGNOSIS — I10 Essential (primary) hypertension: Secondary | ICD-10-CM | POA: Insufficient documentation

## 2022-09-29 DIAGNOSIS — Z8049 Family history of malignant neoplasm of other genital organs: Secondary | ICD-10-CM | POA: Diagnosis not present

## 2022-09-29 DIAGNOSIS — F17200 Nicotine dependence, unspecified, uncomplicated: Secondary | ICD-10-CM | POA: Insufficient documentation

## 2022-09-29 DIAGNOSIS — F17210 Nicotine dependence, cigarettes, uncomplicated: Secondary | ICD-10-CM | POA: Insufficient documentation

## 2022-09-29 DIAGNOSIS — R7401 Elevation of levels of liver transaminase levels: Secondary | ICD-10-CM

## 2022-09-29 DIAGNOSIS — Z720 Tobacco use: Secondary | ICD-10-CM

## 2022-09-29 LAB — CBC WITH DIFFERENTIAL/PLATELET
Abs Immature Granulocytes: 0 10*3/uL (ref 0.00–0.07)
Band Neutrophils: 0 %
Basophils Absolute: 0 10*3/uL (ref 0.0–0.1)
Basophils Relative: 0 %
Blasts: 0 %
Eosinophils Absolute: 0.5 10*3/uL (ref 0.0–0.5)
Eosinophils Relative: 4 %
HCT: 43.1 % (ref 39.0–52.0)
Hemoglobin: 14.2 g/dL (ref 13.0–17.0)
Lymphocytes Relative: 30 %
Lymphs Abs: 3.6 10*3/uL (ref 0.7–4.0)
MCH: 31.3 pg (ref 26.0–34.0)
MCHC: 32.9 g/dL (ref 30.0–36.0)
MCV: 94.9 fL (ref 80.0–100.0)
Metamyelocytes Relative: 0 %
Monocytes Absolute: 0.5 10*3/uL (ref 0.1–1.0)
Monocytes Relative: 4 %
Myelocytes: 0 %
Neutro Abs: 7.4 10*3/uL (ref 1.7–7.7)
Neutrophils Relative %: 62 %
Other: 0 %
Platelets: 299 10*3/uL (ref 150–400)
Promyelocytes Relative: 0 %
RBC: 4.54 MIL/uL (ref 4.22–5.81)
RDW: 14.8 % (ref 11.5–15.5)
Smear Review: ADEQUATE
WBC: 12 10*3/uL — ABNORMAL HIGH (ref 4.0–10.5)
nRBC: 0 % (ref 0.0–0.2)
nRBC: 0 /100 WBC

## 2022-09-29 LAB — COMPREHENSIVE METABOLIC PANEL
ALT: 16 U/L (ref 0–44)
AST: 26 U/L (ref 15–41)
Albumin: 4.1 g/dL (ref 3.5–5.0)
Alkaline Phosphatase: 65 U/L (ref 38–126)
Anion gap: 9 (ref 5–15)
BUN: 25 mg/dL — ABNORMAL HIGH (ref 6–20)
CO2: 25 mmol/L (ref 22–32)
Calcium: 9.5 mg/dL (ref 8.9–10.3)
Chloride: 101 mmol/L (ref 98–111)
Creatinine, Ser: 0.87 mg/dL (ref 0.61–1.24)
GFR, Estimated: >60 mL/min (ref >60)
Glucose, Bld: 97 mg/dL (ref 70–99)
Potassium: 4.2 mmol/L (ref 3.5–5.1)
Sodium: 135 mmol/L (ref 135–145)
Total Bilirubin: 0.7 mg/dL (ref 0.3–1.2)
Total Protein: 8.3 g/dL — ABNORMAL HIGH (ref 6.5–8.1)

## 2022-09-29 LAB — RETIC PANEL
Immature Retic Fract: 4.5 % (ref 2.3–15.9)
RBC.: 4.51 MIL/uL (ref 4.22–5.81)
Retic Count, Absolute: 51.4 10*3/uL (ref 19.0–186.0)
Retic Ct Pct: 1.1 % (ref 0.4–3.1)
Reticulocyte Hemoglobin: 31.8 pg (ref >27.9)

## 2022-09-29 LAB — IRON AND TIBC
Iron: 57 ug/dL (ref 45–182)
Saturation Ratios: 13 % — ABNORMAL LOW (ref 17.9–39.5)
TIBC: 452 ug/dL — ABNORMAL HIGH (ref 250–450)
UIBC: 395 ug/dL

## 2022-09-29 LAB — FERRITIN: Ferritin: 48 ng/mL (ref 24–336)

## 2022-09-29 NOTE — Assessment & Plan Note (Signed)
history of GERD/esophageal stricture Stable improved symptoms since alcohol cessation.

## 2022-09-29 NOTE — Assessment & Plan Note (Signed)
Labs reviewed and discussed with patient Decreased iron saturation, consistent with iron deficiency. Normal hemoglobin Recommend IV venofer x 1.

## 2022-09-29 NOTE — Assessment & Plan Note (Signed)
Smoking cessation was discussed. 

## 2022-09-29 NOTE — Progress Notes (Signed)
Feeling better. More energy. No fatigue. Appetite is good. No blood in stool anymore. Denies any dyspnea.

## 2022-09-29 NOTE — Assessment & Plan Note (Signed)
Normal hemoglobin. Observation.

## 2022-09-29 NOTE — Progress Notes (Signed)
Hematology/Oncology follow up note [phone] [phone]   Patient Care Team: Franciso Bend, NP as PCP - General (Nurse Practitioner) Rickard Patience, MD as Consulting Physician (Hematology and Oncology)  ASSESSMENT & PLAN:   Iron deficiency Labs reviewed and discussed with patient Decreased iron saturation, consistent with iron deficiency. Normal hemoglobin Recommend IV venofer x 1.   Erythrocytosis Normal hemoglobin. Observation.   Tobacco use Smoking cessation was discussed.  Gastroesophageal reflux disease  history of GERD/esophageal stricture Stable improved symptoms since alcohol cessation.    Orders Placed This Encounter  Procedures   Ferritin    Standing Status:   Future    Standing Expiration Date:   03/31/2023   Iron and TIBC    Standing Status:   Future    Standing Expiration Date:   09/30/2023   CBC with Differential/Platelet    Standing Status:   Future    Standing Expiration Date:   09/30/2023   Follow up in 6 months.  All questions were answered. The patient knows to call the clinic with any problems, questions or concerns.  Rickard Patience, MD, PhD [facility] Hematology Oncology 09/29/2022   CHIEF COMPLAINTS/REASON FOR VISIT:  History of iron deficiency anemia, erythrocytosis.  HISTORY OF PRESENTING ILLNESS:  Justin Powers is a  31 y.o.  male with PMH listed below who was referred to me for evaluation of anemia Patient recently presented to ED after an episode of "upper chest tightness". Reports having multiple episodes for the past few months.  In ED, he had negative cardiology work up including negative EKG, troponin, hemoglobin was noted to be low at 10.6, microcytic.  He was sent home with presumed diagnosis of iron deficiency anemia and prescribed with iron supplements.  Also reports fatigue, progressively worsened lately.  He followed up with PCP and had additional work up done.  Reviewed patient's recent labs that was done  at [facility] office. Labs revealed anemia with hemoglobin of 12, MCV 68, platelet count 378,000,  Differential showed increased basophils,  Iron panel showed saturation of 68%, iron 363, TIBC 537, normal TSH Reviewed patient's previous labs ordered by primary care physician's office, anemia is chronic onset , duration is since  No aggravating or improving factors.   #Previous gastroenterology work-up # EGD and colonoscopy on 11/08/2018 Non-bleeding internal hemorrhoids. - The examined portion of the ileum was normal. Biopsied. - The entire examined colon is normal. Biopsied. - The examination was otherwise normal. .A large hiatal hernia was present, LA Grade D (one or more mucosal breaks involving at least 75% of esophageal circumference) esophagitis with bleeding was found in the lower third of the esophagus. Normal examined duodenum. Biopsied.   EGD on 03/06/2019 which showed large hiatal hernia, benign-appearing esophageal stenosis.  Dilated and biopsied.  Esophagitis.  Biopsy results showed stratified squamous epithelium with rare intraepithelial eosinophils and neutrophils, parakeratosis and a basal hyperplasia.  Negative for dysplasia and malignancy. #He had a repeat EGD on 7/14/2020Which showed large hiatal hernia, benign appearance esophageal stenosis.  Dilated.   INTERVAL HISTORY Justin Powers is a 31 y.o. 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  He has stopped alcohol use.  Feels well. No new complaints today   Review of Systems  Constitutional:  Negative for appetite change, chills, fatigue, fever and unexpected weight change.  HENT:   Negative for hearing loss and voice change.   Eyes:  Negative for eye problems and icterus.  Respiratory:  Negative for chest tightness, cough and shortness of breath.   Cardiovascular:  Negative for chest pain and leg swelling.   Gastrointestinal:  Negative for abdominal distention, abdominal pain, blood in stool and diarrhea.  Endocrine: Negative for hot flashes.  Genitourinary:  Negative for difficulty urinating, dysuria and frequency.   Musculoskeletal:  Negative for arthralgias.  Skin:  Negative for itching and rash.  Neurological:  Negative for headaches, light-headedness and numbness.  Hematological:  Negative for adenopathy. Does not bruise/bleed easily.  Psychiatric/Behavioral:  Negative for confusion. The patient is not nervous/anxious.     MEDICAL HISTORY:  Past Medical History:  Diagnosis Date   Anemia    Hypertension     SURGICAL HISTORY: Past Surgical History:  Procedure Laterality Date   COLONOSCOPY WITH PROPOFOL N/A 11/08/2018   Procedure: COLONOSCOPY WITH PROPOFOL;  Surgeon: [name], MD;  Location: [facility] ENDOSCOPY;  Service: Gastroenterology;  Laterality: N/A;   ESOPHAGOGASTRODUODENOSCOPY (EGD) WITH PROPOFOL N/A 11/08/2018   Procedure: ESOPHAGOGASTRODUODENOSCOPY (EGD) WITH PROPOFOL;  Surgeon: [name], MD;  Location: [facility] ENDOSCOPY;  Service: Gastroenterology;  Laterality: N/A;   ESOPHAGOGASTRODUODENOSCOPY (EGD) WITH PROPOFOL N/A 03/06/2019   Procedure: ESOPHAGOGASTRODUODENOSCOPY (EGD) WITH PROPOFOL with Dilation;  Surgeon: [name], MD;  Location: [facility] ENDOSCOPY;  Service: Gastroenterology;  Laterality: N/A;   ESOPHAGOGASTRODUODENOSCOPY (EGD) WITH PROPOFOL N/A 05/01/2019   Procedure: ESOPHAGOGASTRODUODENOSCOPY (EGD) WITH PROPOFOL with Dilation;  Surgeon: [name], MD;  Location: [facility] ENDOSCOPY;  Service: Gastroenterology;  Laterality: N/A;   ESOPHAGOGASTRODUODENOSCOPY (EGD) WITH PROPOFOL N/A 09/21/2019   Procedure: ESOPHAGOGASTRODUODENOSCOPY (EGD) WITH PROPOFOL with Dilation;  Surgeon: [name], MD;  Location: [facility] ENDOSCOPY;  Service: Gastroenterology;  Laterality: N/A;   wisdon tooth removal      SOCIAL HISTORY: Social History   Socioeconomic History   Marital status:  Married    Spouse name: Not on file   Number of children: Not on file   Years of education: Not on file   Highest education level: Not on file  Occupational History   Not on file  Tobacco Use   Smoking status: Every Day    Packs/day: 1.00    Types: Cigarettes   Smokeless tobacco: Never  Vaping Use   Vaping Use: Never used  Substance and Sexual Activity   Alcohol use: Not Currently    Comment: Occassionally    Drug use: Never   Sexual activity: Not on file  Other Topics Concern   Not on file  Social History Narrative   Not on file   Social Determinants of Health   Financial Resource Strain: Not on file  Food Insecurity: No Food Insecurity (08/02/2022)   Hunger Vital Sign    Worried About Running Out of Food in the Last Year: Never true    Ran Out of Food in the Last Year: Never true  Transportation Needs: No Transportation Needs (08/02/2022)   PRAPARE - Administrator, Civil Service (Medical): No    Lack of Transportation (Non-Medical): No  Physical Activity: Not on file  Stress: Not on file  Social Connections: Not on file  Intimate Partner Violence: Not At Risk (08/02/2022)   Humiliation, Afraid, Rape, and Kick questionnaire    Fear of Current or Ex-Partner: No    Emotionally Abused: No    Physically Abused: No    Sexually Abused: No    FAMILY HISTORY: Family History  Problem Relation Age of Onset   Crohn's disease Mother    Hypertension  Mother    Breast cancer Maternal Aunt    Bladder Cancer Maternal Grandfather    Cervical cancer Other    Non-Hodgkin's lymphoma Other     ALLERGIES:  has No Known Allergies.  MEDICATIONS:  Current Outpatient Medications  Medication Sig Dispense Refill   folic acid (FOLVITE) 1 MG tablet Take 1 tablet (1 mg total) by mouth daily. 30 tablet 1   lisinopril (ZESTRIL) 10 MG tablet Take 10 mg by mouth daily.     pantoprazole (PROTONIX) 40 MG tablet Take 1 tablet (40 mg total) by mouth daily. 30 tablet 2    propranolol (INDERAL) 40 MG tablet Take 40 mg by mouth 2 (two) times daily.     albuterol (VENTOLIN HFA) 108 (90 Base) MCG/ACT inhaler Inhale 1-2 puffs into the lungs every 4 (four) hours as needed for wheezing or shortness of breath. (Patient not taking: Reported on 09/29/2022)     clonazePAM (KLONOPIN) 0.5 MG tablet Take 0.5 mg by mouth daily as needed. (Patient not taking: Reported on 08/16/2022)     ondansetron (ZOFRAN) 8 MG tablet Take by mouth. (Patient not taking: Reported on 09/29/2022)     No current facility-administered medications for this visit.     PHYSICAL EXAMINATION:  Vitals:   09/29/22 1428  BP: 127/85  Pulse: (!) 51  Resp: 16  Temp: 98.2 F (36.8 C)  SpO2: 100%   Filed Weights   09/29/22 1428  Weight: 196 lb 4.8 oz (89 kg)    Physical Exam Constitutional:      General: He is not in acute distress.    Appearance: He is obese.  HENT:     Head: Normocephalic and atraumatic.  Eyes:     General: No scleral icterus.    Pupils: Pupils are equal, round, and reactive to light.  Cardiovascular:     Rate and Rhythm: Normal rate and regular rhythm.     Heart sounds: Normal heart sounds.  Pulmonary:     Effort: Pulmonary effort is normal. No respiratory distress.     Breath sounds: No wheezing.  Abdominal:     General: Bowel sounds are normal. There is no distension.     Palpations: Abdomen is soft. There is no mass.     Tenderness: There is no abdominal tenderness.  Musculoskeletal:        General: No deformity. Normal range of motion.     Cervical back: Normal range of motion and neck supple.  Skin:    General: Skin is warm and dry.     Coloration: Skin is not pale.     Findings: No erythema or rash.  Neurological:     Mental Status: He is alert and oriented to person, place, and time.     Cranial Nerves: No cranial nerve deficit.     Coordination: Coordination normal.  Psychiatric:        Behavior: Behavior normal.        Thought Content: Thought  content normal.      LABORATORY DATA:  I have reviewed the data as listed    Latest Ref Rng & Units 09/29/2022    2:16 PM 08/04/2022    3:31 AM 08/03/2022    4:44 AM  CBC  WBC 4.0 - 10.5 K/uL 12.0  11.2  15.4   Hemoglobin 13.0 - 17.0 g/dL 16.114.2  09.611.8  04.512.6   Hematocrit 39.0 - 52.0 % 43.1  33.3  37.9   Platelets 150 - 400 K/uL 299  143  149       Latest Ref Rng & Units 09/29/2022    2:16 PM 08/05/2022    5:22 AM 08/04/2022    3:31 AM  CMP  Glucose 70 - 99 mg/dL 97  88  71   BUN 6 - 20 mg/dL 25  11  20    Creatinine 0.61 - 1.24 mg/dL  6.94  8.54   Sodium 135 - 145 mmol/L 135  135  135   Potassium 3.5 - 5.1 mmol/L 4.2  3.2  3.2   Chloride 98 - 111 mmol/L 101  97  94   CO2 22 - 32 mmol/L 25  29  32   Calcium 8.9 - 10.3 mg/dL 9.5  7.6  8.2   Total Protein 6.5 - 8.1 g/dL 8.3     Total Bilirubin 0.3 - 1.2 mg/dL 0.7     Alkaline Phos 38 - 126 U/L 65     AST 15 - 41 U/L 26     ALT 0 - 44 U/L 16      Lab Results  Component Value Date   IRON 57 09/29/2022   TIBC 452 (H) 09/29/2022   FERRITIN 48 09/29/2022

## 2022-09-30 ENCOUNTER — Telehealth: Payer: Self-pay

## 2022-09-30 NOTE — Telephone Encounter (Signed)
Please schedule venofer x1 next week.

## 2022-09-30 NOTE — Telephone Encounter (Signed)
-----   Message from Rickard Patience, MD sent at 09/29/2022  9:20 PM EST ----- Iron labs shows mild iron deficiency.  I recommend 1 dose of IV venofer. If he agrees, please arrange. Thanks.

## 2022-10-14 ENCOUNTER — Encounter: Payer: Self-pay | Admitting: Oncology

## 2022-10-20 ENCOUNTER — Inpatient Hospital Stay: Payer: 59 | Attending: Oncology

## 2022-10-20 VITALS — BP 118/71 | HR 50 | Temp 98.4°F | Resp 16

## 2022-10-20 DIAGNOSIS — D751 Secondary polycythemia: Secondary | ICD-10-CM | POA: Diagnosis not present

## 2022-10-20 DIAGNOSIS — D509 Iron deficiency anemia, unspecified: Secondary | ICD-10-CM | POA: Insufficient documentation

## 2022-10-20 DIAGNOSIS — Z8639 Personal history of other endocrine, nutritional and metabolic disease: Secondary | ICD-10-CM

## 2022-10-20 MED ORDER — SODIUM CHLORIDE 0.9 % IV SOLN
INTRAVENOUS | Status: DC
  Filled 2022-10-20: qty 250

## 2022-10-20 MED ORDER — SODIUM CHLORIDE 0.9 % IV SOLN
200.0000 mg | Freq: Once | INTRAVENOUS | Status: AC
  Administered 2022-10-20: 200 mg via INTRAVENOUS
  Filled 2022-10-20: qty 200

## 2022-10-20 NOTE — Patient Instructions (Signed)

## 2023-03-31 ENCOUNTER — Inpatient Hospital Stay (HOSPITAL_BASED_OUTPATIENT_CLINIC_OR_DEPARTMENT_OTHER): Payer: 59 | Admitting: Oncology

## 2023-03-31 ENCOUNTER — Inpatient Hospital Stay: Payer: 59 | Attending: Oncology

## 2023-03-31 ENCOUNTER — Encounter: Payer: Self-pay | Admitting: Oncology

## 2023-03-31 VITALS — BP 120/69 | HR 82 | Temp 97.3°F | Resp 18 | Wt 175.4 lb

## 2023-03-31 DIAGNOSIS — Z807 Family history of other malignant neoplasms of lymphoid, hematopoietic and related tissues: Secondary | ICD-10-CM | POA: Diagnosis not present

## 2023-03-31 DIAGNOSIS — K222 Esophageal obstruction: Secondary | ICD-10-CM | POA: Insufficient documentation

## 2023-03-31 DIAGNOSIS — E611 Iron deficiency: Secondary | ICD-10-CM | POA: Insufficient documentation

## 2023-03-31 DIAGNOSIS — Z803 Family history of malignant neoplasm of breast: Secondary | ICD-10-CM | POA: Diagnosis not present

## 2023-03-31 DIAGNOSIS — D751 Secondary polycythemia: Secondary | ICD-10-CM

## 2023-03-31 DIAGNOSIS — Z8049 Family history of malignant neoplasm of other genital organs: Secondary | ICD-10-CM | POA: Insufficient documentation

## 2023-03-31 DIAGNOSIS — Z8052 Family history of malignant neoplasm of bladder: Secondary | ICD-10-CM | POA: Insufficient documentation

## 2023-03-31 DIAGNOSIS — K219 Gastro-esophageal reflux disease without esophagitis: Secondary | ICD-10-CM | POA: Diagnosis not present

## 2023-03-31 DIAGNOSIS — Z720 Tobacco use: Secondary | ICD-10-CM

## 2023-03-31 DIAGNOSIS — F17210 Nicotine dependence, cigarettes, uncomplicated: Secondary | ICD-10-CM | POA: Insufficient documentation

## 2023-03-31 LAB — CBC WITH DIFFERENTIAL/PLATELET
Abs Immature Granulocytes: 0.04 10*3/uL (ref 0.00–0.07)
Basophils Absolute: 0.1 10*3/uL (ref 0.0–0.1)
Basophils Relative: 1 %
Eosinophils Absolute: 0.3 10*3/uL (ref 0.0–0.5)
Eosinophils Relative: 3 %
HCT: 42.3 % (ref 39.0–52.0)
Hemoglobin: 13.5 g/dL (ref 13.0–17.0)
Immature Granulocytes: 0 %
Lymphocytes Relative: 21 %
Lymphs Abs: 2 10*3/uL (ref 0.7–4.0)
MCH: 28.1 pg (ref 26.0–34.0)
MCHC: 31.9 g/dL (ref 30.0–36.0)
MCV: 88.1 fL (ref 80.0–100.0)
Monocytes Absolute: 0.8 10*3/uL (ref 0.1–1.0)
Monocytes Relative: 8 %
Neutro Abs: 6.2 10*3/uL (ref 1.7–7.7)
Neutrophils Relative %: 67 %
Platelets: 240 10*3/uL (ref 150–400)
RBC: 4.8 MIL/uL (ref 4.22–5.81)
RDW: 14.5 % (ref 11.5–15.5)
WBC: 9.3 10*3/uL (ref 4.0–10.5)
nRBC: 0 % (ref 0.0–0.2)

## 2023-03-31 LAB — IRON AND TIBC
Iron: 57 ug/dL (ref 45–182)
Saturation Ratios: 14 % — ABNORMAL LOW (ref 17.9–39.5)
TIBC: 395 ug/dL (ref 250–450)
UIBC: 338 ug/dL

## 2023-03-31 LAB — FERRITIN: Ferritin: 29 ng/mL (ref 24–336)

## 2023-03-31 NOTE — Assessment & Plan Note (Addendum)
Labs reviewed and discussed with patient Lab Results  Component Value Date   HGB 13.5 03/31/2023   TIBC 395 03/31/2023   IRONPCTSAT 14 (L) 03/31/2023   FERRITIN 29 03/31/2023     Decreased iron saturation, consistent with iron deficiency. Normal hemoglobin Recommend IV venofer x 1.

## 2023-03-31 NOTE — Progress Notes (Signed)
Hematology/Oncology follow up note [phone] [phone]   Patient Care Team: Franciso Bend, NP as PCP - General (Nurse Practitioner) Rickard Patience, MD as Consulting Physician (Hematology and Oncology)  ASSESSMENT & PLAN:   Iron deficiency Labs reviewed and discussed with patient Lab Results  Component Value Date   HGB 13.5 03/31/2023   TIBC 395 03/31/2023   IRONPCTSAT 14 (L) 03/31/2023   FERRITIN 29 03/31/2023     Decreased iron saturation, consistent with iron deficiency. Normal hemoglobin Recommend IV venofer x 1.   Erythrocytosis Normal hemoglobin. Observation.   Gastroesophageal reflux disease  history of GERD/esophageal stricture Stable improved symptoms since alcohol cessation.   Tobacco use Smoking cessation was discussed.   Orders Placed This Encounter  Procedures   CBC with Differential (Cancer Center Only)    Standing Status:   Future    Standing Expiration Date:   03/30/2024   CMP (Cancer Center only)    Standing Status:   Future    Standing Expiration Date:   03/30/2024   Iron and TIBC    Standing Status:   Future    Standing Expiration Date:   03/30/2024   Ferritin    Standing Status:   Future    Standing Expiration Date:   03/30/2024   Follow up in 6 months.  All questions were answered. The patient knows to call the clinic with any problems, questions or concerns.  Rickard Patience, MD, PhD [facility] Hematology Oncology 03/31/2023   CHIEF COMPLAINTS/REASON FOR VISIT:  History of iron deficiency anemia, erythrocytosis.  HISTORY OF PRESENTING ILLNESS:  Justin Powers is a  32 y.o.  male with PMH listed below who was referred to me for evaluation of anemia Patient recently presented to ED after an episode of "upper chest tightness". Reports having multiple episodes for the past few months.  In ED, he had negative cardiology work up including negative EKG, troponin, hemoglobin was noted to be low at 10.6, microcytic.  He was sent  home with presumed diagnosis of iron deficiency anemia and prescribed with iron supplements.  Also reports fatigue, progressively worsened lately.  He followed up with PCP and had additional work up done.  Reviewed patient's recent labs that was done at [facility] office. Labs revealed anemia with hemoglobin of 12, MCV 68, platelet count 378,000,  Differential showed increased basophils,  Iron panel showed saturation of 68%, iron 363, TIBC 537, normal TSH Reviewed patient's previous labs ordered by primary care physician's office, anemia is chronic onset , duration is since  No aggravating or improving factors.   #Previous gastroenterology work-up # EGD and colonoscopy on 11/08/2018 Non-bleeding internal hemorrhoids. - The examined portion of the ileum was normal. Biopsied. - The entire examined colon is normal. Biopsied. - The examination was otherwise normal. .A large hiatal hernia was present, LA Grade D (one or more mucosal breaks involving at least 75% of esophageal circumference) esophagitis with bleeding was found in the lower third of the esophagus. Normal examined duodenum. Biopsied.   EGD on 03/06/2019 which showed large hiatal hernia, benign-appearing esophageal stenosis.  Dilated and biopsied.  Esophagitis.  Biopsy results showed stratified squamous epithelium with rare intraepithelial eosinophils and neutrophils, parakeratosis and a basal hyperplasia.  Negative for dysplasia and malignancy. #He had a repeat EGD on 7/14/2020Which showed large hiatal hernia, benign appearance esophageal stenosis.  Dilated.   INTERVAL HISTORY Justin Powers is a 32 y.o. 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  He has stopped alcohol use.  Feels well. No new complaints today He has lost weight internationally.    Review of Systems  Constitutional:  Negative for appetite change,  chills, fatigue, fever and unexpected weight change.  HENT:   Negative for hearing loss and voice change.   Eyes:  Negative for eye problems and icterus.  Respiratory:  Negative for chest tightness, cough and shortness of breath.   Cardiovascular:  Negative for chest pain and leg swelling.  Gastrointestinal:  Negative for abdominal distention, abdominal pain, blood in stool and diarrhea.  Endocrine: Negative for hot flashes.  Genitourinary:  Negative for difficulty urinating, dysuria and frequency.   Musculoskeletal:  Negative for arthralgias.  Skin:  Negative for itching and rash.  Neurological:  Negative for headaches, light-headedness and numbness.  Hematological:  Negative for adenopathy. Does not bruise/bleed easily.  Psychiatric/Behavioral:  Negative for confusion. The patient is not nervous/anxious.     MEDICAL HISTORY:  Past Medical History:  Diagnosis Date   Anemia    Hypertension     SURGICAL HISTORY: Past Surgical History:  Procedure Laterality Date   COLONOSCOPY WITH PROPOFOL N/A 11/08/2018   Procedure: COLONOSCOPY WITH PROPOFOL;  Surgeon: [name], MD;  Location: [facility] ENDOSCOPY;  Service: Gastroenterology;  Laterality: N/A;   ESOPHAGOGASTRODUODENOSCOPY (EGD) WITH PROPOFOL N/A 11/08/2018   Procedure: ESOPHAGOGASTRODUODENOSCOPY (EGD) WITH PROPOFOL;  Surgeon: [name], MD;  Location: [facility] ENDOSCOPY;  Service: Gastroenterology;  Laterality: N/A;   ESOPHAGOGASTRODUODENOSCOPY (EGD) WITH PROPOFOL N/A 03/06/2019   Procedure: ESOPHAGOGASTRODUODENOSCOPY (EGD) WITH PROPOFOL with Dilation;  Surgeon: [name], MD;  Location: [facility] ENDOSCOPY;  Service: Gastroenterology;  Laterality: N/A;   ESOPHAGOGASTRODUODENOSCOPY (EGD) WITH PROPOFOL N/A 05/01/2019   Procedure: ESOPHAGOGASTRODUODENOSCOPY (EGD) WITH PROPOFOL with Dilation;  Surgeon: [name], MD;  Location[facility] ENDOSCOPY;  Service: Gastroenterology;  Laterality: N/A;   ESOPHAGOGASTRODUODENOSCOPY (EGD) WITH PROPOFOL N/A  09/21/2019   Procedure: ESOPHAGOGASTRODUODENOSCOPY (EGD) WITH PROPOFOL with Dilation;  Surgeon: [name], MD;  Location: [facility] ENDOSCOPY;  Service: Gastroenterology;  Laterality: N/A;   wisdon tooth removal      SOCIAL HISTORY: Social History   Socioeconomic History   Marital status: Married    Spouse name: Not on file   Number of children: Not on file   Years of education: Not on file   Highest education level: Not on file  Occupational History   Not on file  Tobacco Use   Smoking status: Every Day    Packs/day: 1    Types: Cigarettes   Smokeless tobacco: Never  Vaping Use   Vaping Use: Never used  Substance and Sexual Activity   Alcohol use: Not Currently    Comment: Occassionally    Drug use: Never   Sexual activity: Not on file  Other Topics Concern   Not on file  Social History Narrative   Not on file   Social Determinants of Health   Financial Resource Strain: Not on file  Food Insecurity: No Food Insecurity (08/02/2022)   Hunger Vital Sign    Worried About Running Out of Food in the Last Year: Never true    Ran Out of Food in the Last Year: Never true  Transportation Needs: No Transportation Needs (08/02/2022)   PRAPARE - Administrator, Civil Service (Medical): No    Lack of Transportation (Non-Medical): No  Physical Activity: Not on file  Stress: Not on file  Social Connections: Not on file  Intimate  Partner Violence: Not At Risk (08/02/2022)   Humiliation, Afraid, Rape, and Kick questionnaire    Fear of Current or Ex-Partner: No    Emotionally Abused: No    Physically Abused: No    Sexually Abused: No    FAMILY HISTORY: Family History  Problem Relation Age of Onset   Crohn's disease Mother    Hypertension Mother    Breast cancer Maternal Aunt    Bladder Cancer Maternal Grandfather    Cervical cancer Other    Non-Hodgkin's lymphoma Other     ALLERGIES:  has No Known Allergies.  MEDICATIONS:  Current Outpatient Medications   Medication Sig Dispense Refill   folic acid (FOLVITE) 1 MG tablet Take 1 tablet (1 mg total) by mouth daily. 30 tablet 1   lisinopril (ZESTRIL) 10 MG tablet Take 10 mg by mouth daily.     propranolol (INDERAL) 40 MG tablet Take 40 mg by mouth 2 (two) times daily.     albuterol (VENTOLIN HFA) 108 (90 Base) MCG/ACT inhaler Inhale 1-2 puffs into the lungs every 4 (four) hours as needed for wheezing or shortness of breath. (Patient not taking: Reported on 09/29/2022)     clonazePAM (KLONOPIN) 0.5 MG tablet Take 0.5 mg by mouth daily as needed. (Patient not taking: Reported on 08/16/2022)     ondansetron (ZOFRAN) 8 MG tablet Take by mouth. (Patient not taking: Reported on 09/29/2022)     pantoprazole (PROTONIX) 40 MG tablet Take 1 tablet (40 mg total) by mouth daily. (Patient not taking: Reported on 03/31/2023) 30 tablet 2   No current facility-administered medications for this visit.     PHYSICAL EXAMINATION:  Vitals:   03/31/23 1355  BP: 120/69  Pulse: 82  Resp: 18  Temp: (!) 97.3 F (36.3 C)  SpO2: 99%   Filed Weights   03/31/23 1355  Weight: 175 lb 6.4 oz (79.6 kg)    Physical Exam Constitutional:      General: He is not in acute distress.    Appearance: He is obese.  HENT:     Head: Normocephalic and atraumatic.  Eyes:     General: No scleral icterus.    Pupils: Pupils are equal, round, and reactive to light.  Cardiovascular:     Rate and Rhythm: Normal rate and regular rhythm.     Heart sounds: Normal heart sounds.  Pulmonary:     Effort: Pulmonary effort is normal. No respiratory distress.     Breath sounds: No wheezing.  Abdominal:     General: Bowel sounds are normal. There is no distension.     Palpations: Abdomen is soft. There is no mass.     Tenderness: There is no abdominal tenderness.  Musculoskeletal:        General: No deformity. Normal range of motion.     Cervical back: Normal range of motion and neck supple.  Skin:    General: Skin is warm and dry.      Coloration: Skin is not pale.     Findings: No erythema or rash.  Neurological:     Mental Status: He is alert and oriented to person, place, and time.     Cranial Nerves: No cranial nerve deficit.     Coordination: Coordination normal.  Psychiatric:        Behavior: Behavior normal.        Thought Content: Thought content normal.      LABORATORY DATA:  I have reviewed the data as listed    Latest Ref  Rng & Units 03/31/2023    1:47 PM 09/29/2022    2:16 PM 08/04/2022    3:31 AM  CBC  WBC 4.0 - 10.5 K/uL 9.3  12.0  11.2   Hemoglobin 13.0 - 17.0 g/dL 40.9  81.1  91.4   Hematocrit 39.0 - 52.0 % 42.3  43.1  33.3   Platelets 150 - 400 K/uL 240  299  143       Latest Ref Rng & Units 09/29/2022    2:16 PM 08/05/2022    5:22 AM 08/04/2022    3:31 AM  CMP  Glucose 70 - 99 mg/dL 97  88  71   BUN 6 - 20 mg/dL 25  11  20    Creatinine 0.61 - 1.24 mg/dL 7.82  9.56  2.13   Sodium 135 - 145 mmol/L 135  135  135   Potassium 3.5 - 5.1 mmol/L 4.2  3.2  3.2   Chloride 98 - 111 mmol/L 101  97  94   CO2 22 - 32 mmol/L 25  29  32   Calcium 8.9 - 10.3 mg/dL 9.5  7.6  8.2   Total Protein 6.5 - 8.1 g/dL 8.3     Total Bilirubin 0.3 - 1.2 mg/dL 0.7     Alkaline Phos 38 - 126 U/L 65     AST 15 - 41 U/L 26     ALT 0 - 44 U/L 16      Lab Results  Component Value Date   IRON 57 03/31/2023   TIBC 395 03/31/2023   FERRITIN 29 03/31/2023

## 2023-03-31 NOTE — Assessment & Plan Note (Signed)
history of GERD/esophageal stricture Stable improved symptoms since alcohol cessation.  

## 2023-03-31 NOTE — Assessment & Plan Note (Signed)
Smoking cessation was discussed. 

## 2023-03-31 NOTE — Assessment & Plan Note (Signed)
Normal hemoglobin. Observation.  

## 2023-04-01 ENCOUNTER — Telehealth: Payer: Self-pay

## 2023-04-01 NOTE — Telephone Encounter (Signed)
Called patient and informed him that his iron came back low and that Dr. Cathie Hoops would like for him to get one dose of Venofer. I informed patient that I will have scheduling schedule and notify him of appointment date and time.

## 2023-04-01 NOTE — Telephone Encounter (Signed)
-----   Message from Rickard Patience, MD sent at 03/31/2023  8:47 PM EDT ----- Please arrange him to get one dose of Venofer. Thanks.

## 2023-04-18 ENCOUNTER — Inpatient Hospital Stay: Payer: 59 | Attending: Oncology

## 2023-04-18 VITALS — BP 103/66 | HR 62 | Temp 96.9°F | Resp 18

## 2023-04-18 DIAGNOSIS — E611 Iron deficiency: Secondary | ICD-10-CM | POA: Diagnosis present

## 2023-04-18 DIAGNOSIS — Z8639 Personal history of other endocrine, nutritional and metabolic disease: Secondary | ICD-10-CM

## 2023-04-18 MED ORDER — SODIUM CHLORIDE 0.9 % IV SOLN
200.0000 mg | Freq: Once | INTRAVENOUS | Status: AC
  Administered 2023-04-18: 200 mg via INTRAVENOUS
  Filled 2023-04-18: qty 200

## 2023-04-18 MED ORDER — SODIUM CHLORIDE 0.9 % IV SOLN
INTRAVENOUS | Status: DC
  Filled 2023-04-18: qty 250

## 2023-04-18 NOTE — Patient Instructions (Signed)

## 2023-09-11 ENCOUNTER — Observation Stay: Payer: 59

## 2023-09-11 ENCOUNTER — Observation Stay
Admission: EM | Admit: 2023-09-11 | Discharge: 2023-09-13 | Discharge status: Against Medical Advice | Payer: 59 | Attending: Internal Medicine | Admitting: Internal Medicine

## 2023-09-11 ENCOUNTER — Other Ambulatory Visit: Payer: Self-pay

## 2023-09-11 ENCOUNTER — Encounter: Payer: Self-pay | Admitting: Emergency Medicine

## 2023-09-11 DIAGNOSIS — Z79899 Other long term (current) drug therapy: Secondary | ICD-10-CM | POA: Diagnosis not present

## 2023-09-11 DIAGNOSIS — D649 Anemia, unspecified: Secondary | ICD-10-CM | POA: Insufficient documentation

## 2023-09-11 DIAGNOSIS — L0390 Cellulitis, unspecified: Secondary | ICD-10-CM

## 2023-09-11 DIAGNOSIS — I10 Essential (primary) hypertension: Secondary | ICD-10-CM | POA: Insufficient documentation

## 2023-09-11 DIAGNOSIS — Z5321 Procedure and treatment not carried out due to patient leaving prior to being seen by health care provider: Secondary | ICD-10-CM | POA: Diagnosis not present

## 2023-09-11 DIAGNOSIS — F17210 Nicotine dependence, cigarettes, uncomplicated: Secondary | ICD-10-CM | POA: Insufficient documentation

## 2023-09-11 DIAGNOSIS — L0291 Cutaneous abscess, unspecified: Secondary | ICD-10-CM

## 2023-09-11 DIAGNOSIS — E875 Hyperkalemia: Secondary | ICD-10-CM | POA: Insufficient documentation

## 2023-09-11 DIAGNOSIS — L03116 Cellulitis of left lower limb: Principal | ICD-10-CM | POA: Diagnosis present

## 2023-09-11 DIAGNOSIS — R21 Rash and other nonspecific skin eruption: Secondary | ICD-10-CM | POA: Diagnosis present

## 2023-09-11 LAB — URINE DRUG SCREEN, QUALITATIVE (ARMC ONLY)
Amphetamines, Ur Screen: NOT DETECTED
Barbiturates, Ur Screen: NOT DETECTED
Benzodiazepine, Ur Scrn: NOT DETECTED
Cannabinoid 50 Ng, Ur ~~LOC~~: POSITIVE — AB
Cocaine Metabolite,Ur ~~LOC~~: NOT DETECTED
MDMA (Ecstasy)Ur Screen: NOT DETECTED
Methadone Scn, Ur: NOT DETECTED
Opiate, Ur Screen: POSITIVE — AB
Phencyclidine (PCP) Ur S: NOT DETECTED
Tricyclic, Ur Screen: NOT DETECTED

## 2023-09-11 LAB — CBC WITH DIFFERENTIAL/PLATELET
Abs Immature Granulocytes: 0.1 10*3/uL — ABNORMAL HIGH (ref 0.00–0.07)
Basophils Absolute: 0.1 10*3/uL (ref 0.0–0.1)
Basophils Relative: 0 %
Eosinophils Absolute: 0.3 10*3/uL (ref 0.0–0.5)
Eosinophils Relative: 2 %
HCT: 42.3 % (ref 39.0–52.0)
Hemoglobin: 13.6 g/dL (ref 13.0–17.0)
Immature Granulocytes: 1 %
Lymphocytes Relative: 17 %
Lymphs Abs: 2.7 10*3/uL (ref 0.7–4.0)
MCH: 28.8 pg (ref 26.0–34.0)
MCHC: 32.2 g/dL (ref 30.0–36.0)
MCV: 89.6 fL (ref 80.0–100.0)
Monocytes Absolute: 1.1 10*3/uL — ABNORMAL HIGH (ref 0.1–1.0)
Monocytes Relative: 7 %
Neutro Abs: 11.9 10*3/uL — ABNORMAL HIGH (ref 1.7–7.7)
Neutrophils Relative %: 73 %
Platelets: 364 10*3/uL (ref 150–400)
RBC: 4.72 MIL/uL (ref 4.22–5.81)
RDW: 13.6 % (ref 11.5–15.5)
WBC: 16.1 10*3/uL — ABNORMAL HIGH (ref 4.0–10.5)
nRBC: 0 % (ref 0.0–0.2)

## 2023-09-11 LAB — COMPREHENSIVE METABOLIC PANEL
ALT: 14 U/L (ref 0–44)
AST: 32 U/L (ref 15–41)
Albumin: 4.2 g/dL (ref 3.5–5.0)
Alkaline Phosphatase: 87 U/L (ref 38–126)
Anion gap: 8 (ref 5–15)
BUN: 42 mg/dL — ABNORMAL HIGH (ref 6–20)
CO2: 26 mmol/L (ref 22–32)
Calcium: 9.5 mg/dL (ref 8.9–10.3)
Chloride: 98 mmol/L (ref 98–111)
Creatinine, Ser: 1.16 mg/dL (ref 0.61–1.24)
GFR, Estimated: >60 mL/min (ref >60)
Glucose, Bld: 113 mg/dL — ABNORMAL HIGH (ref 70–99)
Potassium: 5.5 mmol/L — ABNORMAL HIGH (ref 3.5–5.1)
Sodium: 132 mmol/L — ABNORMAL LOW (ref 135–145)
Total Bilirubin: 0.7 mg/dL (ref <1.2)
Total Protein: 8.6 g/dL — ABNORMAL HIGH (ref 6.5–8.1)

## 2023-09-11 LAB — MRSA NEXT GEN BY PCR, NASAL: MRSA by PCR Next Gen: NOT DETECTED

## 2023-09-11 LAB — LACTIC ACID, PLASMA: Lactic Acid, Venous: 0.6 mmol/L (ref 0.5–1.9)

## 2023-09-11 MED ORDER — CEFTRIAXONE SODIUM 2 G IJ SOLR
2.0000 g | Freq: Once | INTRAMUSCULAR | Status: DC
  Filled 2023-09-11: qty 20

## 2023-09-11 MED ORDER — AMLODIPINE BESYLATE 5 MG PO TABS
5.0000 mg | ORAL_TABLET | Freq: Every day | ORAL | Status: DC
  Administered 2023-09-12: 5 mg via ORAL
  Filled 2023-09-11: qty 1

## 2023-09-11 MED ORDER — SENNOSIDES-DOCUSATE SODIUM 8.6-50 MG PO TABS: 1.0000 | ORAL_TABLET | Freq: Every evening | ORAL | Status: DC | PRN

## 2023-09-11 MED ORDER — ENOXAPARIN SODIUM 40 MG/0.4ML IJ SOSY
40.0000 mg | PREFILLED_SYRINGE | INTRAMUSCULAR | Status: DC
  Administered 2023-09-11 – 2023-09-12 (×2): 40 mg via SUBCUTANEOUS
  Filled 2023-09-11 (×2): qty 0.4

## 2023-09-11 MED ORDER — VANCOMYCIN HCL IN DEXTROSE 1-5 GM/200ML-% IV SOLN: 1000.0000 mg | Freq: Once | INTRAVENOUS | Status: DC

## 2023-09-11 MED ORDER — CEFAZOLIN SODIUM-DEXTROSE 2-4 GM/100ML-% IV SOLN
2.0000 g | Freq: Three times a day (TID) | INTRAVENOUS | Status: DC
  Administered 2023-09-11 – 2023-09-12 (×5): 2 g via INTRAVENOUS
  Filled 2023-09-11 (×6): qty 100

## 2023-09-11 MED ORDER — DIPHENHYDRAMINE HCL 25 MG PO CAPS: 25.0000 mg | ORAL_CAPSULE | Freq: Four times a day (QID) | ORAL | Status: DC | PRN

## 2023-09-11 MED ORDER — LACTATED RINGERS IV SOLN: INTRAVENOUS | Status: AC

## 2023-09-11 MED ORDER — HYDROCODONE-ACETAMINOPHEN 5-325 MG PO TABS
1.0000 | ORAL_TABLET | ORAL | Status: DC | PRN
  Administered 2023-09-11: 2 via ORAL
  Administered 2023-09-11: 1 via ORAL
  Administered 2023-09-12 – 2023-09-13 (×5): 2 via ORAL
  Filled 2023-09-11 (×2): qty 2
  Filled 2023-09-11: qty 1
  Filled 2023-09-11 (×4): qty 2

## 2023-09-11 MED ORDER — ONDANSETRON HCL 4 MG/2ML IJ SOLN
4.0000 mg | Freq: Four times a day (QID) | INTRAMUSCULAR | Status: DC | PRN
  Filled 2023-09-11: qty 2

## 2023-09-11 MED ORDER — HYDROCORTISONE 1 % EX OINT
TOPICAL_OINTMENT | Freq: Four times a day (QID) | CUTANEOUS | Status: DC
  Filled 2023-09-11: qty 28.35

## 2023-09-11 MED ORDER — ACETAMINOPHEN 650 MG RE SUPP: 650.0000 mg | Freq: Four times a day (QID) | RECTAL | Status: DC | PRN

## 2023-09-11 MED ORDER — DOXYCYCLINE HYCLATE 100 MG PO TABS
100.0000 mg | ORAL_TABLET | Freq: Two times a day (BID) | ORAL | Status: DC
  Administered 2023-09-11 – 2023-09-12 (×3): 100 mg via ORAL
  Filled 2023-09-11 (×3): qty 1

## 2023-09-11 MED ORDER — PROPRANOLOL HCL 20 MG PO TABS
40.0000 mg | ORAL_TABLET | Freq: Two times a day (BID) | ORAL | Status: DC
  Administered 2023-09-11 – 2023-09-12 (×2): 40 mg via ORAL
  Filled 2023-09-11 (×2): qty 2

## 2023-09-11 MED ORDER — TRIAMCINOLONE ACETONIDE 0.5 % EX OINT
TOPICAL_OINTMENT | Freq: Four times a day (QID) | CUTANEOUS | Status: DC
  Administered 2023-09-11: 1 via TOPICAL
  Filled 2023-09-11: qty 15

## 2023-09-11 MED ORDER — VANCOMYCIN HCL 1500 MG/300ML IV SOLN
1500.0000 mg | Freq: Once | INTRAVENOUS | Status: AC
Start: 2023-09-11 — End: 2023-09-11
  Administered 2023-09-11: 1500 mg via INTRAVENOUS
  Filled 2023-09-11: qty 300

## 2023-09-11 MED ORDER — LACTATED RINGERS IV BOLUS (SEPSIS)
1000.0000 mL | Freq: Once | INTRAVENOUS | Status: AC
  Administered 2023-09-11: 1000 mL via INTRAVENOUS

## 2023-09-11 MED ORDER — ACETAMINOPHEN 325 MG PO TABS: 650.0000 mg | ORAL_TABLET | Freq: Four times a day (QID) | ORAL | Status: DC | PRN

## 2023-09-11 MED ORDER — LISINOPRIL 10 MG PO TABS: 10.0000 mg | ORAL_TABLET | Freq: Every day | ORAL | Status: DC

## 2023-09-11 MED ORDER — ONDANSETRON HCL 4 MG PO TABS: 4.0000 mg | ORAL_TABLET | Freq: Four times a day (QID) | ORAL | Status: DC | PRN

## 2023-09-11 NOTE — H&P (Addendum)
History and Physical    Justin Powers ZOX:[id number] DOB: 1991-06-14 DOA: 09/11/2023  PCP: Patient, No Pcp Per (Confirm with patient/family/NH records and if not entered, this has to be entered at [facility] point of entry) Patient coming from: Home  I have personally briefly reviewed patient's old medical records in [facility]  Chief Complaint: Worsening of painful rash on left leg  HPI: Justin Powers is a 32 y.o. male with medical history significant of HTN, presented with worsening of left leg rash and pain.  About 2 to 3 weeks ago, patient first developed a small skin bump on the left inner ankle area, feeling tender and developed rash as well.  Patient used Proheal hydrocolloid gel bandage applied on the top of the pump then the next day developed rash around the area and went to see PCP on Monday who started patient on doxycycline, not getting better and patient went to urgent care on Wednesday and was started on Levaquin in addition to doxycycline.  Despite, he continued to experience worsening of rash and pain of the area and also developed itchiness especially at night.  In the last few days he started to see another painful rash on the outer side of left ankle.  He denied any muscle pain joint pain fever or chills.  Denies any drug use.  ED Course: Afebrile, mild tachycardia nonhypotensive.  Blood work showed WBC 16, sodium 132, K5.5 lactic acid 0.6 currently 1.1.  Patient was started on vancomycin and ceftriaxone  Review of Systems: As per HPI otherwise 14 point review of systems negative.    Past Medical History:  Diagnosis Date   Anemia    Hypertension     Past Surgical History:  Procedure Laterality Date   COLONOSCOPY WITH PROPOFOL N/A 11/08/2018   Procedure: COLONOSCOPY WITH PROPOFOL;  Surgeon: [name], MD;  Location: [facility] ENDOSCOPY;  Service: Gastroenterology;  Laterality: N/A;   ESOPHAGOGASTRODUODENOSCOPY (EGD) WITH PROPOFOL N/A 11/08/2018   Procedure:  ESOPHAGOGASTRODUODENOSCOPY (EGD) WITH PROPOFOL;  Surgeon: [name], MD;  Location: [facility] ENDOSCOPY;  Service: Gastroenterology;  Laterality: N/A;   ESOPHAGOGASTRODUODENOSCOPY (EGD) WITH PROPOFOL N/A 03/06/2019   Procedure: ESOPHAGOGASTRODUODENOSCOPY (EGD) WITH PROPOFOL with Dilation;  Surgeon: [name], MD;  Location: [facility] ENDOSCOPY;  Service: Gastroenterology;  Laterality: N/A;   ESOPHAGOGASTRODUODENOSCOPY (EGD) WITH PROPOFOL N/A 05/01/2019   Procedure: ESOPHAGOGASTRODUODENOSCOPY (EGD) WITH PROPOFOL with Dilation;  Surgeon: [name], MD;  Location: [facility] ENDOSCOPY;  Service: Gastroenterology;  Laterality: N/A;   ESOPHAGOGASTRODUODENOSCOPY (EGD) WITH PROPOFOL N/A 09/21/2019   Procedure: ESOPHAGOGASTRODUODENOSCOPY (EGD) WITH PROPOFOL with Dilation;  Surgeon: [name], MD;  Location: [facility] ENDOSCOPY;  Service: Gastroenterology;  Laterality: N/A;   wisdon tooth removal       reports that he has been smoking cigarettes. He has never used smokeless tobacco. He reports that he does not currently use alcohol. He reports that he does not use drugs.  No Known Allergies  Family History  Problem Relation Age of Onset   Crohn's disease Mother    Hypertension Mother    Breast cancer Maternal Aunt    Bladder Cancer Maternal Grandfather    Cervical cancer Other    Non-Hodgkin's lymphoma Other      Prior to Admission medications   Medication Sig Start Date End Date Taking? Authorizing Provider  albuterol (VENTOLIN HFA) 108 (90 Base) MCG/ACT inhaler Inhale 1-2 puffs into the lungs every 4 (four) hours as needed for wheezing or shortness of breath. Patient not taking: Reported on 09/29/2022 02/19/19   [provider]  clonazePAM (KLONOPIN) 0.5 MG tablet Take 0.5 mg by mouth daily as needed. Patient not taking: Reported on 08/16/2022 09/25/20   [provider]  folic acid (FOLVITE) 1 MG tablet Take 1 tablet (1 mg total) by mouth daily. 08/06/22   Enedina Finner, MD  lisinopril  (ZESTRIL) 10 MG tablet Take 10 mg by mouth daily. 07/07/22   [provider]  ondansetron (ZOFRAN) 8 MG tablet Take by mouth. Patient not taking: Reported on 09/29/2022 02/16/21   [provider]  pantoprazole (PROTONIX) 40 MG tablet Take 1 tablet (40 mg total) by mouth daily. Patient not taking: Reported on 03/31/2023 08/16/22   [name], MD  propranolol (INDERAL) 40 MG tablet Take 40 mg by mouth 2 (two) times daily. 02/08/21   [provider]    Physical Exam: Vitals:   09/11/23 1434  BP: 127/82  Pulse: (!) 59  Resp: 18  Temp: 98.4 F (36.9 C)  TempSrc: Oral  SpO2: 100%  Weight: 70.3 kg  Height: 5\' 7"  (1.702 m)    Constitutional: NAD, calm, comfortable Vitals:   09/11/23 1434  BP: 127/82  Pulse: (!) 59  Resp: 18  Temp: 98.4 F (36.9 C)  TempSrc: Oral  SpO2: 100%  Weight: 70.3 kg  Height: 5\' 7"  (1.702 m)   Eyes: PERRL, lids and conjunctivae normal ENMT: Mucous membranes are moist. Posterior pharynx clear of any exudate or lesions.Normal dentition.  Neck: normal, supple, no masses, no thyromegaly Respiratory: clear to auscultation bilaterally, no wheezing, no crackles. Normal respiratory effort. No accessory muscle use.  Cardiovascular: Regular rate and rhythm, no murmurs / rubs / gallops. No extremity edema. 2+ pedal pulses. No carotid bruits.  Abdomen: no tenderness, no masses palpated. No hepatosplenomegaly. Bowel sounds positive.  Musculoskeletal: no clubbing / cyanosis. No joint deformity upper and lower extremities. Good ROM, no contractures. Normal muscle tone.  Skin: Micropapular rash on the mid inner calf area, with multiple skin stretch and scratch marks with a square central clearance area which appears to be the size of the Proheal gel bandage.  No skin opening or ulcer, no discharge a smaller papular macular rash on the left outer ankle area.  Local skin is warm and tender to touch Neurologic: CN 2-12 grossly intact. Sensation intact,  DTR normal. Strength 5/5 in all 4.  Psychiatric: Normal judgment and insight. Alert and oriented x 3. Normal mood.        Labs on Admission: I have personally reviewed following labs and imaging studies  CBC: Recent Labs  Lab 09/11/23 1436  WBC 16.1*  NEUTROABS 11.9*  HGB 13.6  HCT 42.3  MCV 89.6  PLT 364   Basic Metabolic Panel: Recent Labs  Lab 09/11/23 1436  NA 132*  K 5.5*  CL 98  CO2 26  GLUCOSE 113*  BUN 42*  CREATININE 1.16  CALCIUM 9.5   GFR: Estimated Creatinine Clearance: 85.5 mL/min (by C-G formula based on SCr of 1.16 mg/dL). Liver Function Tests: Recent Labs  Lab 09/11/23 1436  AST 32  ALT 14  ALKPHOS 87  BILITOT 0.7  PROT 8.6*  ALBUMIN 4.2   No results for input(s): "LIPASE", "AMYLASE" in the last 168 hours. No results for input(s): "AMMONIA" in the last 168 hours. Coagulation Profile: No results for input(s): "INR", "PROTIME" in the last 168 hours. Cardiac Enzymes: No results for input(s): "CKTOTAL", "CKMB", "CKMBINDEX", "TROPONINI" in the last 168 hours. BNP (last 3 results) No results for input(s): "PROBNP" in the last  8760 hours. HbA1C: No results for input(s): "HGBA1C" in the last 72 hours. CBG: No results for input(s): "GLUCAP" in the last 168 hours. Lipid Profile: No results for input(s): "CHOL", "HDL", "LDLCALC", "TRIG", "CHOLHDL", "LDLDIRECT" in the last 72 hours. Thyroid Function Tests: No results for input(s): "TSH", "T4TOTAL", "FREET4", "T3FREE", "THYROIDAB" in the last 72 hours. Anemia Panel: No results for input(s): "VITAMINB12", "FOLATE", "FERRITIN", "TIBC", "IRON", "RETICCTPCT" in the last 72 hours. Urine analysis:    Component Value Date/Time   COLORURINE AMBER (A) 08/02/2022 1730   APPEARANCEUR CLOUDY (A) 08/02/2022 1730   APPEARANCEUR Clear 10/30/2018 1341   LABSPEC 1.015 08/02/2022 1730   PHURINE 8.0 08/02/2022 1730   GLUCOSEU NEGATIVE 08/02/2022 1730   HGBUR NEGATIVE 08/02/2022 1730   BILIRUBINUR NEGATIVE  08/02/2022 1730   BILIRUBINUR Negative 10/30/2018 1341   KETONESUR NEGATIVE 08/02/2022 1730   PROTEINUR 100 (A) 08/02/2022 1730   NITRITE NEGATIVE 08/02/2022 1730   LEUKOCYTESUR NEGATIVE 08/02/2022 1730    Radiological Exams on Admission: No results found.  EKG: None Assessment/Plan Principal Problem:   Cellulitis  (please populate well all problems here in Problem List. (For example, if patient is on BP meds at home and you resume or decide to hold them, it is a problem that needs to be her. Same for CAD, COPD, HLD and so on)  Maculopapular rash left shin and left outer ankle -Clinically many suspect this is combined cellulitis and contact dermatitis.  Especially left mid shin area with a rectangular central clearance matching the size ans shape of a Proheal gel bandage, additionally with surrounding contact dermatitis like changes with skin scratching and stretching marks. -Low suspicion for MRSA infection, will not redose vancomycin -Consult pharmacy for Ancef -Topical Kenalog -DVT study pending, will also order x-ray to rule out foreign bodies -Other Ddx, not exactly the season for Lyme disease but patient not sure about whether there was a bug bite to the left shin area.  Will check Lyme screening test. Will keep doxycycline for another 3 days to complete a total of 10 days course.  Hyperkalemia -Change lisinopril to amlodipine  DVT prophylaxis: Lovenox Code Status: Full code Family Communication: None at bedside Disposition Plan: Expect less than 2 midnight hospital stay Consults called: None Admission status: MedSurg observation   Emeline General MD [facility] Pager [phone]  09/11/2023, 5:22 PM

## 2023-09-11 NOTE — Progress Notes (Signed)
ED Pharmacy Antibiotic Sign Off An antibiotic consult was received from an ED provider for vancomcyin per pharmacy dosing for cellulitis. A chart review was completed to assess appropriateness.   The following one time order(s) were placed:  Vancomycin 1500mg  IV  Further antibiotic and/or antibiotic pharmacy consults should be ordered by the admitting provider if indicated.   Thank you for allowing pharmacy to be a part of this patient's care.   Doroteo Glassman, [facility]  Clinical Pharmacist 09/11/23 4:45 PM

## 2023-09-11 NOTE — Progress Notes (Signed)
Pharmacy Antibiotic Note  Justin Powers is a 32 y.o. male admitted on 09/11/2023 with nonpurulent cellulitis of LLE.  Pharmacy has been consulted for cefazolin dosing.  Plan: Cefazolin 2g IV q8h PO doxycycline per MD  Height: 5\' 7"  (170.2 cm) Weight: 70.3 kg (155 lb) IBW/kg (Calculated) : 66.1  Temp (24hrs), Avg:98.4 F (36.9 C), Min:98.4 F (36.9 C), Max:98.4 F (36.9 C)  Recent Labs  Lab 09/11/23 1436  WBC 16.1*  CREATININE 1.16  LATICACIDVEN 0.6    Estimated Creatinine Clearance: 85.5 mL/min (by C-G formula based on SCr of 1.16 mg/dL).    No Known Allergies  Antimicrobials this admission: Vanc 11/24 x1 Cefazolin 11/24> Doxycycline (PTA continued at admission) 11/17> PTA levofloxacin  Microbiology results: Pending BCx 11/24  Thank you for allowing pharmacy to be a part of this patient's care.  Doroteo Glassman 09/11/2023 5:25 PM

## 2023-09-11 NOTE — ED Provider Notes (Signed)
[facility] Provider Note   Event Date/Time   First MD Initiated Contact with Patient 09/11/23 1615     (approximate) History  Leg Pain  HPI Justin Powers is a 32 y.o. male history presents complaining of persistent infection to the left lower extremity despite 2 separate antibiotic courses.  Patient states that he has had this wound to the left lower extremity that has been indolent over the past.  Patient states that it began as a bug bite and eventually grew into a erythematous region that is extremely painful without any purulent discharge.  Patient now states that over the last 2 weeks he has developed another what he believes is an insect bite to the left lateral malleolus and now has swelling and erythema over this area as well ROS: Patient currently denies any vision changes, tinnitus, difficulty speaking, facial droop, sore throat, chest pain, shortness of breath, abdominal pain, nausea/vomiting/diarrhea, dysuria, or weakness/numbness/paresthesias in any extremity   Physical Exam  Triage Vital Signs: ED Triage Vitals [09/11/23 1434]  Encounter Vitals Group     BP 127/82     Systolic BP Percentile      Diastolic BP Percentile      Pulse Rate (!) 59     Resp 18     Temp 98.4 F (36.9 C)     Temp Source Oral     SpO2 100 %     Weight 155 lb (70.3 kg)     Height 5\' 7"  (1.702 m)     Head Circumference      Peak Flow      Pain Score 4     Pain Loc      Pain Education      Exclude from Growth Chart    Most recent vital signs: Vitals:   09/11/23 1434  BP: 127/82  Pulse: (!) 59  Resp: 18  Temp: 98.4 F (36.9 C)  SpO2: 100%   General: Awake, oriented x4. CV:  Good peripheral perfusion.  Resp:  Normal effort.  Abd:  No distention.  Other:  Middle-aged well-developed, well-nourished Caucasian male resting comfortably in no acute distress.  There is a 4 cm diameter area of erythema, induration, and tenderness to palpation over the medial mid calf  area of the left lower extremity.  There is also an area 3 cm diameter at the left lateral malleolus that is erythematous, indurated, and tender to palpation ED Results / Procedures / Treatments  Labs (all labs ordered are listed, but only abnormal results are displayed) Labs Reviewed  COMPREHENSIVE METABOLIC PANEL - Abnormal; Notable for the following components:      Result Value   Sodium 132 (*)    Potassium 5.5 (*)    Glucose, Bld 113 (*)    BUN 42 (*)    Total Protein 8.6 (*)    All other components within normal limits  CBC WITH DIFFERENTIAL/PLATELET - Abnormal; Notable for the following components:   WBC 16.1 (*)    Neutro Abs 11.9 (*)    Monocytes Absolute 1.1 (*)    Abs Immature Granulocytes 0.10 (*)    All other components within normal limits  CULTURE, BLOOD (ROUTINE X 2)  CULTURE, BLOOD (ROUTINE X 2)  MRSA NEXT GEN BY PCR, NASAL  LACTIC ACID, PLASMA  HIV ANTIBODY (ROUTINE TESTING W REFLEX)  BASIC METABOLIC PANEL  CBC   PROCEDURES: Critical Care performed: No .1-3 Lead EKG Interpretation  Performed by: Merwyn Katos, MD Authorized by:  Merwyn Katos, MD     Interpretation: normal     ECG rate:  71   ECG rate assessment: normal     Rhythm: sinus rhythm     Ectopy: none     Conduction: normal    MEDICATIONS ORDERED IN ED: Medications  lactated ringers infusion (has no administration in time range)  lactated ringers bolus 1,000 mL (1,000 mLs Intravenous New Bag/Given 09/11/23 1724)  vancomycin (VANCOREADY) IVPB 1500 mg/300 mL (has no administration in time range)  hydrocortisone 1 % ointment (has no administration in time range)  diphenhydrAMINE (BENADRYL) capsule 25 mg (has no administration in time range)  propranolol (INDERAL) tablet 40 mg (has no administration in time range)  enoxaparin (LOVENOX) injection 40 mg (has no administration in time range)  acetaminophen (TYLENOL) tablet 650 mg (has no administration in time range)    Or  acetaminophen  (TYLENOL) suppository 650 mg (has no administration in time range)  HYDROcodone-acetaminophen (NORCO/VICODIN) 5-325 MG per tablet 1-2 tablet (has no administration in time range)  ondansetron (ZOFRAN) tablet 4 mg (has no administration in time range)    Or  ondansetron (ZOFRAN) injection 4 mg (has no administration in time range)  senna-docusate (Senokot-S) tablet 1 tablet (has no administration in time range)  amLODipine (NORVASC) tablet 5 mg (has no administration in time range)  ceFAZolin (ANCEF) IVPB 2g/100 mL premix (has no administration in time range)   IMPRESSION / MDM / ASSESSMENT AND PLAN / ED COURSE  I reviewed the triage vital signs and the nursing notes.                             The patient is on the cardiac monitor to evaluate for evidence of arrhythmia and/or significant heart rate changes. Patient's presentation is most consistent with acute presentation with potential threat to life or bodily function. Presentation most consistent with simple cellulitis. Given History, Exam, and Workup I have low suspicion for Necrotizing Fasciitis, Abscess, Osteomyelitis, DVT or other emergent problem as a cause for this presentation.  Given patient has failed 2 separate outpatient regimens of antibiotics including doxycycline and Levaquin, he will require admission of the internal medicine service for further evaluation, IV antibiotics, and further management  Disposition: Admit to medicine   FINAL CLINICAL IMPRESSION(S) / ED DIAGNOSES   Final diagnoses:  Cellulitis of left lower extremity   Rx / DC Orders   ED Discharge Orders     None      Note:  This document was prepared using Dragon voice recognition software and may include unintentional dictation errors.   Merwyn Katos, MD 09/11/23 1726

## 2023-09-11 NOTE — Progress Notes (Signed)
CODE SEPSIS - PHARMACY COMMUNICATION  **Broad Spectrum Antibiotics should be administered within 1 hour of Sepsis diagnosis**  Time Code Sepsis Called/Page Received: 1645  Antibiotics Ordered: 1644  Time of 1st antibiotic administration: 1733  Additional action taken by pharmacy: n/a  If necessary, Name of Provider/Nurse Contacted: n/a    Doroteo Glassman ,PharmD Clinical Pharmacist  09/11/2023  5:15 PM

## 2023-09-11 NOTE — ED Triage Notes (Signed)
Patient to ED via POV for redness and swelling to left calf and left ankle. Patient seen at [facility] clinic and PCP- taking antibiotics currently but not getting better.

## 2023-09-11 NOTE — Sepsis Progress Note (Signed)
Elink following code sepsis °

## 2023-09-12 ENCOUNTER — Observation Stay: Payer: 59

## 2023-09-12 DIAGNOSIS — L03116 Cellulitis of left lower limb: Secondary | ICD-10-CM | POA: Diagnosis not present

## 2023-09-12 LAB — CBC
HCT: 37.8 % — ABNORMAL LOW (ref 39.0–52.0)
Hemoglobin: 12.1 g/dL — ABNORMAL LOW (ref 13.0–17.0)
MCH: 28.4 pg (ref 26.0–34.0)
MCHC: 32 g/dL (ref 30.0–36.0)
MCV: 88.7 fL (ref 80.0–100.0)
Platelets: 236 10*3/uL (ref 150–400)
RBC: 4.26 MIL/uL (ref 4.22–5.81)
RDW: 13.6 % (ref 11.5–15.5)
WBC: 10.5 10*3/uL (ref 4.0–10.5)
nRBC: 0 % (ref 0.0–0.2)

## 2023-09-12 LAB — BASIC METABOLIC PANEL
Anion gap: 6 (ref 5–15)
BUN: 31 mg/dL — ABNORMAL HIGH (ref 6–20)
CO2: 26 mmol/L (ref 22–32)
Calcium: 9.1 mg/dL (ref 8.9–10.3)
Chloride: 103 mmol/L (ref 98–111)
Creatinine, Ser: 0.93 mg/dL (ref 0.61–1.24)
GFR, Estimated: >60 mL/min (ref >60)
Glucose, Bld: 82 mg/dL (ref 70–99)
Potassium: 4.2 mmol/L (ref 3.5–5.1)
Sodium: 135 mmol/L (ref 135–145)

## 2023-09-12 LAB — HIV ANTIBODY (ROUTINE TESTING W REFLEX): HIV Screen 4th Generation wRfx: NONREACTIVE

## 2023-09-12 NOTE — Progress Notes (Signed)
Progress Note    Justin Powers  IHK:[id number] DOB: Jan 23, 1991  DOA: 09/11/2023 PCP: Patient, No Pcp Per      Brief Narrative:    Medical records reviewed and are as summarized below:  Justin Powers is a 32 y.o. 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  He said he noticed a small skin bump on the medial aspect of the left leg and a rash about 2 weeks prior to admission.  He applied Proheal hydrocolloid bandage to the lesion on his left leg.  He saw his PCP a week later and was prescribed doxycycline.  However,, he did not see any improvement after a few days of doxycycline.  He went to an urgent care clinic and Levaquin was started.  Despite taking the antibiotics, there was no improvement in his symptoms.  He presented to the ED on 09/11/2023 for further management.         Assessment/Plan:   Principal Problem:   Left leg cellulitis    Body mass index is 24.28 kg/m.   Left leg cellulitis, ?  Contact dermatitis left leg: Continue IV Ancef and doxycycline. Failed to improve on doxycycline and Levaquin prior to admission. Ultrasound soft tissue of indurated area on the medial aspect of the left distal leg did not show any evidence of abscess but findings were consistent with cellulitis.  Venous duplex of the left lower extremity did not show any evidence of DVT. Lyme serology is pending. Leukocytosis has resolved   Hypertension: Continue amlodipine   Sinus bradycardia, history of sinus tachycardia and tremors: He said he has been taking propranolol for years and he notices increasing heart rate if he does not take his propranolol. His heart rate was in the 40s this morning so propranolol has been held.   Hyponatremia and hyperkalemia: Resolved   Urine drug screen positive  for cannabinoids and opiates.     Diet Order             Diet regular Room service appropriate? Yes; Fluid consistency: Thin  Diet effective now                            Consultants: None  Procedures: None    Medications:    amLODipine  5 mg Oral Daily   doxycycline  100 mg Oral Q12H   enoxaparin (LOVENOX) injection  40 mg Subcutaneous Q24H   propranolol  40 mg Oral BID   triamcinolone ointment   Topical QID   Continuous Infusions:   ceFAZolin (ANCEF) IV 2 g (09/12/23 1451)     Anti-infectives (From admission, onward)    Start     Dose/Rate Route Frequency Ordered Stop   09/11/23 2200  doxycycline (VIBRA-TABS) tablet 100 mg        100 mg Oral Every 12 hours 09/11/23 1737     09/11/23 1730  ceFAZolin (ANCEF) IVPB 2g/100 mL premix        2 g 200 mL/hr over 30 Minutes Intravenous Every 8 hours 09/11/23 1725     09/11/23 1700  vancomycin (VANCOREADY) IVPB 1500 mg/300 mL        1,500 mg 150 mL/hr over 120 Minutes Intravenous  Once 09/11/23 1644 09/11/23 2011   09/11/23 1645  vancomycin (VANCOCIN) IVPB 1000 mg/200 mL  premix  Status:  Discontinued        1,000 mg 200 mL/hr over 60 Minutes Intravenous  Once 09/11/23 1641 09/11/23 1644   09/11/23 1645  cefTRIAXone (ROCEPHIN) 2 g in sodium chloride 0.9 % 100 mL IVPB  Status:  Discontinued        2 g 200 mL/hr over 30 Minutes Intravenous  Once 09/11/23 1641 09/11/23 1725              Family Communication/Anticipated D/C date and plan/Code Status   DVT prophylaxis: enoxaparin (LOVENOX) injection 40 mg Start: 09/11/23 2200     Code Status: Full Code  Family Communication: None Disposition Plan: Plan to discharge home   Status is: Observation The patient will require care spanning > 2 midnights and should be moved to inpatient because: On IV antibiotics       Subjective:   Interval events noted.  He complains of pain in the left leg.  He thinks the pain is getting  worse.  Objective:    Vitals:   09/12/23 0815 09/12/23 0900 09/12/23 1407 09/12/23 1425  BP: 114/82  132/70   Pulse: (!) 48 (!) 52 (!) 50 60  Resp: 18  18   Temp: 98.2 F (36.8 C)  98.4 F (36.9 C)   TempSrc: Oral  Oral   SpO2: 100%  100%   Weight:      Height:       No data found.   Intake/Output Summary (Last 24 hours) at 09/12/2023 1524 Last data filed at 09/12/2023 1100 Gross per 24 hour  Intake 2419.5 ml  Output --  Net 2419.5 ml   Filed Weights   09/11/23 1434  Weight: 70.3 kg    Exam:  GEN: NAD SKIN: Warm and dry EYES: No pallor or icterus ENT: MMM CV: RRR PULM: CTA B ABD: soft, ND, NT, +BS CNS: AAO x 3, non focal EXT: Tenderness on the left leg.  An erythematous area of induration with tenderness on the medial aspect of the middle third of the left leg. Another erythematous and tender area around the left lateral malleolus.       Data Reviewed:   I have personally reviewed following labs and imaging studies:  Labs: Labs show the following:   Basic Metabolic Panel: Recent Labs  Lab 09/11/23 1436 09/12/23 0429  NA 132* 135  K 5.5* 4.2  CL 98 103  CO2 26 26  GLUCOSE 113* 82  BUN 42* 31*  CREATININE 1.16 0.93  CALCIUM 9.5 9.1   GFR Estimated Creatinine Clearance: 106.6 mL/min (by C-G formula based on SCr of 0.93 mg/dL). Liver Function Tests: Recent Labs  Lab 09/11/23 1436  AST 32  ALT 14  ALKPHOS 87  BILITOT 0.7  PROT 8.6*  ALBUMIN 4.2   No results for input(s): "LIPASE", "AMYLASE" in the last 168 hours. No results for input(s): "AMMONIA" in the last 168 hours. Coagulation profile No results for input(s): "INR", "PROTIME" in the last 168 hours.  CBC: Recent Labs  Lab 09/11/23 1436 09/12/23 0429  WBC 16.1* 10.5  NEUTROABS 11.9*  --   HGB 13.6 12.1*  HCT 42.3 37.8*  MCV 89.6 88.7  PLT 364 236   Cardiac Enzymes: No results for input(s): "CKTOTAL", "CKMB", "CKMBINDEX", "TROPONINI" in the last 168 hours. BNP (last 3  results) No results for input(s): "PROBNP" in the last 8760 hours. CBG: No results for input(s): "GLUCAP" in the last 168 hours. D-Dimer: No results for input(s): "DDIMER" in the  last 72 hours. Hgb A1c: No results for input(s): "HGBA1C" in the last 72 hours. Lipid Profile: No results for input(s): "CHOL", "HDL", "LDLCALC", "TRIG", "CHOLHDL", "LDLDIRECT" in the last 72 hours. Thyroid function studies: No results for input(s): "TSH", "T4TOTAL", "T3FREE", "THYROIDAB" in the last 72 hours.  Invalid input(s): "FREET3" Anemia work up: No results for input(s): "VITAMINB12", "FOLATE", "FERRITIN", "TIBC", "IRON", "RETICCTPCT" in the last 72 hours. Sepsis Labs: Recent Labs  Lab 09/11/23 1436 09/12/23 0429  WBC 16.1* 10.5  LATICACIDVEN 0.6  --     Microbiology Recent Results (from the past 240 hour(s))  Blood culture (routine x 2)     Status: None (Preliminary result)   Collection Time: 09/11/23  5:23 PM   Specimen: BLOOD  Result Value Ref Range Status   Specimen Description BLOOD BLOOD LEFT ARM  Final   Special Requests   Final    BOTTLES DRAWN AEROBIC AND ANAEROBIC Blood Culture adequate volume   Culture   Final    NO GROWTH < 24 HOURS Performed at [facility], [address] [place], [place] [address]    Report Status PENDING  Incomplete  Blood culture (routine x 2)     Status: None (Preliminary result)   Collection Time: 09/11/23  5:23 PM   Specimen: BLOOD  Result Value Ref Range Status   Specimen Description BLOOD BLOOD RIGHT ARM  Final   Special Requests   Final    BOTTLES DRAWN AEROBIC AND ANAEROBIC Blood Culture adequate volume   Culture   Final    NO GROWTH < 24 HOURS Performed at [facility], [address] [place], [place] [address]    Report Status PENDING  Incomplete  MRSA Next Gen by PCR, Nasal     Status: None   Collection Time: 09/11/23  5:23 PM   Specimen: Nasal Mucosa; Nasal Swab  Result Value Ref Range Status   MRSA by PCR Next  Gen NOT DETECTED NOT DETECTED Final    Comment: (NOTE) The GeneXpert MRSA Assay (FDA approved for NASAL specimens only), is one component of a comprehensive MRSA colonization surveillance program. It is not intended to diagnose MRSA infection nor to guide or monitor treatment for MRSA infections. Test performance is not FDA approved in patients less than 38 years old. Performed at [facility], [address] [place], [place] [address]     Procedures and diagnostic studies:  [place] LT LOWER EXTREM LTD SOFT TISSUE NON VASCULAR  Result Date: 09/12/2023 CLINICAL DATA:  Left lower extremity cellulitis. Evaluate for abscess. EXAM: ULTRASOUND LEFT LOWER EXTREMITY LIMITED TECHNIQUE: Ultrasound examination of the lower extremity soft tissues was performed in the area of clinical concern. COMPARISON:  None Available. FINDINGS: Sonographic evaluation of patient's area of discomfort involving medial aspect of the left lower leg and calf correlates with dermal thickening and subcutaneous edema without definable/drainable fluid collection. IMPRESSION: Sonographic evaluation of patient's area of discomfort involving the medial aspect of the left lower leg and calf correlates with dermal thickening and subcutaneous edema without definable/drainable fluid collection, sonographic findings compatible with cellulitis. Electronically Signed   By: Simonne Come M.D.   On: 09/12/2023 13:53   US Venous Img Lower Unilateral Left (DVT)  Result Date: 09/12/2023 CLINICAL DATA:  Left lower extremity pain and swelling for past month. Cellulitis. Evaluate for DVT. EXAM: LEFT LOWER EXTREMITY VENOUS DOPPLER ULTRASOUND TECHNIQUE: Gray-scale sonography with graded compression, as well as color Doppler and duplex ultrasound were performed to evaluate the lower extremity deep  venous systems from the level of the common femoral vein and including the common femoral, femoral, profunda femoral, popliteal and calf veins  including the posterior tibial, peroneal and gastrocnemius veins when visible. The superficial great saphenous vein was also interrogated. Spectral Doppler was utilized to evaluate flow at rest and with distal augmentation maneuvers in the common femoral, femoral and popliteal veins. COMPARISON:  Left lower extremity soft tissue ultrasound-earlier same day left tibia and fibular radiographs-09/11/2023 FINDINGS: Contralateral Common Femoral Vein: Respiratory phasicity is normal and symmetric with the symptomatic side. No evidence of thrombus. Normal compressibility. Common Femoral Vein: No evidence of thrombus. Normal compressibility, respiratory phasicity and response to augmentation. Saphenofemoral Junction: No evidence of thrombus. Normal compressibility and flow on color Doppler imaging. Profunda Femoral Vein: No evidence of thrombus. Normal compressibility and flow on color Doppler imaging. Femoral Vein: No evidence of thrombus. Normal compressibility, respiratory phasicity and response to augmentation. Popliteal Vein: No evidence of thrombus. Normal compressibility, respiratory phasicity and response to augmentation. Calf Veins: No evidence of thrombus. Normal compressibility and flow on color Doppler imaging. Superficial Great Saphenous Vein: No evidence of thrombus. Normal compressibility. Other Findings:  None. IMPRESSION: No evidence of DVT within the left lower extremity. Electronically Signed   By: Simonne Come M.D.   On: 09/12/2023 13:50   DG Tibia/Fibula Left  Result Date: 09/11/2023 CLINICAL DATA:  92319 Cellulitis 92319 EXAM: LEFT TIBIA AND FIBULA - 2 VIEW COMPARISON:  None Available. FINDINGS: No acute fracture or dislocation. Joint spaces and alignment are maintained. No area of erosion or osseous destruction. No unexpected radiopaque foreign body. Soft tissues are unremarkable. IMPRESSION: No acute fracture or dislocation. Electronically Signed   By: Meda Klinefelter M.D.   On: 09/11/2023  17:51               LOS: 0 days   Betrice Wanat  [facility]   Pager on [email or web address]. If 7PM-7AM, please contact night-coverage at [email or web address]     09/12/2023, 3:24 PM

## 2023-09-13 DIAGNOSIS — Z862 Personal history of diseases of the blood and blood-forming organs and certain disorders involving the immune mechanism: Secondary | ICD-10-CM | POA: Insufficient documentation

## 2023-09-13 DIAGNOSIS — L732 Hidradenitis suppurativa: Secondary | ICD-10-CM | POA: Insufficient documentation

## 2023-09-13 DIAGNOSIS — D72829 Elevated white blood cell count, unspecified: Secondary | ICD-10-CM | POA: Insufficient documentation

## 2023-09-13 DIAGNOSIS — R5383 Other fatigue: Secondary | ICD-10-CM | POA: Insufficient documentation

## 2023-09-13 DIAGNOSIS — L989 Disorder of the skin and subcutaneous tissue, unspecified: Secondary | ICD-10-CM | POA: Insufficient documentation

## 2023-09-13 LAB — LYME DISEASE SEROLOGY W/REFLEX: Lyme Total Antibody EIA: NEGATIVE

## 2023-09-13 NOTE — Progress Notes (Addendum)
Pt wanting to leave AMA. On call hospitalist notified again. MD at bedside to talk with patient. Pt left at 4:15am. PIV removed and AMA form signed. Patient in no acute distress at this time.

## 2023-09-13 NOTE — Progress Notes (Signed)
Pt complaining of excruciating pain in left leg that has gotten worse overtime since admission. Pain described by patient as throbbing, sharp and more painful. PRN pain medication administered and was ineffective. Patient requesting to see ID doctor in the AM. On call hospitalist notified to come to bedside per pt request.

## 2023-09-13 NOTE — Progress Notes (Signed)
       CROSS COVER NOTE  NAME: Justin Powers MRN: [id number] DOB : 20-Jun-1991    Date of Service   09/13/2023   HPI/Events of Note   Nurse paged because patient reported that the cellulitis on his left leg has gotten worse since he has been here.  Nurse reported that the site looked red.  Nurse also reported that patient has been threatening to leave AMA multiple times if he does not talk to an ID doctor or medical doctor. On bedside assessment patient is alert and oriented and does not appear to be in any acute distress.  Patient reports that his left cellulitis site is "painful, and no one is doing anything about it."  It was explained to patient that he is currently getting IV antibiotics as well as oral antibiotics to treat his cellulitis.  Patient has received as needed medications for pain control from his care nurse.  Patient admonished to stay and speak with MD in the a.m. to see if they will be changing his care plan.  Patient insist that he wants to leave the facility.  Patient understood the risk of leaving AGAINST MEDICAL ADVICE included and not limited to disability, worsening of condition and possibly death.  Interventions   Patient not agreeable to staying.  Nurse to provide patient with proper AMA documentation for patient to sign prior to departing facility.     Omri Bertran Lamin Geradine Girt, MSN, APRN, AGACNP-BC [facility] [place] Pager: [phone]. Check Amion for Availability

## 2023-09-13 NOTE — Progress Notes (Signed)
Primary RN asked Charge Nurse to talk with patient as patient is upset about his attending MD and that he wants to know what is happening and his ongoing treatment and plan of care. Patient verbalized that his wound in his left ankle is getting worse. CN in the room to explain that he has been started on IV antibiotic and asked if he wants something for pain, patient said he does not really want pain medicine but he wants his problem to be resolved and to speak to the on call provider. CN advised that hospitalist is attending an emergency as of the moment and that the hospitalist said he'll drop by whenever he is available. Pt understood and verbalized understanding and willingness to wait. AC notified of the concern as well.

## 2023-09-16 LAB — CULTURE, BLOOD (ROUTINE X 2)
Culture: NO GROWTH
Culture: NO GROWTH
Special Requests: ADEQUATE
Special Requests: ADEQUATE

## 2023-09-30 ENCOUNTER — Inpatient Hospital Stay: Payer: 59 | Admitting: Oncology

## 2023-09-30 ENCOUNTER — Inpatient Hospital Stay: Payer: 59

## 2023-11-04 ENCOUNTER — Encounter: Payer: Self-pay | Admitting: Oncology

## 2023-11-04 ENCOUNTER — Inpatient Hospital Stay (HOSPITAL_BASED_OUTPATIENT_CLINIC_OR_DEPARTMENT_OTHER): Payer: 59 | Admitting: Oncology

## 2023-11-04 ENCOUNTER — Inpatient Hospital Stay: Payer: 59 | Attending: Oncology

## 2023-11-04 VITALS — BP 112/68 | HR 49 | Temp 97.9°F | Resp 18 | Wt 164.2 lb

## 2023-11-04 DIAGNOSIS — F17210 Nicotine dependence, cigarettes, uncomplicated: Secondary | ICD-10-CM | POA: Insufficient documentation

## 2023-11-04 DIAGNOSIS — Z720 Tobacco use: Secondary | ICD-10-CM | POA: Diagnosis not present

## 2023-11-04 DIAGNOSIS — K219 Gastro-esophageal reflux disease without esophagitis: Secondary | ICD-10-CM | POA: Insufficient documentation

## 2023-11-04 DIAGNOSIS — D751 Secondary polycythemia: Secondary | ICD-10-CM

## 2023-11-04 DIAGNOSIS — E611 Iron deficiency: Secondary | ICD-10-CM

## 2023-11-04 DIAGNOSIS — Z807 Family history of other malignant neoplasms of lymphoid, hematopoietic and related tissues: Secondary | ICD-10-CM | POA: Diagnosis not present

## 2023-11-04 DIAGNOSIS — Z803 Family history of malignant neoplasm of breast: Secondary | ICD-10-CM | POA: Insufficient documentation

## 2023-11-04 DIAGNOSIS — Z8049 Family history of malignant neoplasm of other genital organs: Secondary | ICD-10-CM | POA: Insufficient documentation

## 2023-11-04 DIAGNOSIS — Z8052 Family history of malignant neoplasm of bladder: Secondary | ICD-10-CM | POA: Insufficient documentation

## 2023-11-04 DIAGNOSIS — D509 Iron deficiency anemia, unspecified: Secondary | ICD-10-CM | POA: Insufficient documentation

## 2023-11-04 LAB — CBC WITH DIFFERENTIAL (CANCER CENTER ONLY)
Abs Immature Granulocytes: 0.11 10*3/uL — ABNORMAL HIGH (ref 0.00–0.07)
Basophils Absolute: 0.1 10*3/uL (ref 0.0–0.1)
Basophils Relative: 0 %
Eosinophils Absolute: 0.2 10*3/uL (ref 0.0–0.5)
Eosinophils Relative: 1 %
HCT: 43.8 % (ref 39.0–52.0)
Hemoglobin: 14 g/dL (ref 13.0–17.0)
Immature Granulocytes: 1 %
Lymphocytes Relative: 28 %
Lymphs Abs: 3.8 10*3/uL (ref 0.7–4.0)
MCH: 28.5 pg (ref 26.0–34.0)
MCHC: 32 g/dL (ref 30.0–36.0)
MCV: 89.2 fL (ref 80.0–100.0)
Monocytes Absolute: 0.9 10*3/uL (ref 0.1–1.0)
Monocytes Relative: 6 %
Neutro Abs: 8.7 10*3/uL — ABNORMAL HIGH (ref 1.7–7.7)
Neutrophils Relative %: 64 %
Platelet Count: 305 10*3/uL (ref 150–400)
RBC: 4.91 MIL/uL (ref 4.22–5.81)
RDW: 14.6 % (ref 11.5–15.5)
WBC Count: 13.7 10*3/uL — ABNORMAL HIGH (ref 4.0–10.5)
nRBC: 0 % (ref 0.0–0.2)

## 2023-11-04 LAB — CMP (CANCER CENTER ONLY)
ALT: 12 U/L (ref 0–44)
AST: 18 U/L (ref 15–41)
Albumin: 4.1 g/dL (ref 3.5–5.0)
Alkaline Phosphatase: 59 U/L (ref 38–126)
Anion gap: 9 (ref 5–15)
BUN: 40 mg/dL — ABNORMAL HIGH (ref 6–20)
CO2: 26 mmol/L (ref 22–32)
Calcium: 9.4 mg/dL (ref 8.9–10.3)
Chloride: 101 mmol/L (ref 98–111)
Creatinine: 1.06 mg/dL (ref 0.61–1.24)
GFR, Estimated: >60 mL/min (ref >60)
Glucose, Bld: 111 mg/dL — ABNORMAL HIGH (ref 70–99)
Potassium: 3.8 mmol/L (ref 3.5–5.1)
Sodium: 136 mmol/L (ref 135–145)
Total Bilirubin: 0.5 mg/dL (ref 0.0–1.2)
Total Protein: 7.3 g/dL (ref 6.5–8.1)

## 2023-11-04 LAB — IRON AND TIBC
Iron: 34 ug/dL — ABNORMAL LOW (ref 45–182)
Saturation Ratios: 9 % — ABNORMAL LOW (ref 17.9–39.5)
TIBC: 365 ug/dL (ref 250–450)
UIBC: 331 ug/dL

## 2023-11-04 LAB — FERRITIN: Ferritin: 57 ng/mL (ref 24–336)

## 2023-11-04 NOTE — Assessment & Plan Note (Signed)
Smoking cessation was discussed. 

## 2023-11-04 NOTE — Assessment & Plan Note (Signed)
history of GERD/esophageal stricture Stable improved symptoms since alcohol cessation.  

## 2023-11-04 NOTE — Progress Notes (Signed)
Hematology/Oncology follow up note [phone] [phone]   Patient Care Team: Patient, No Pcp Per as PCP - General (General Practice) Rickard Patience, MD as Consulting Physician (Hematology and Oncology)  ASSESSMENT & PLAN:   Iron deficiency Labs reviewed and discussed with patient Lab Results  Component Value Date   HGB 14.0 11/04/2023   TIBC 365 11/04/2023   IRONPCTSAT 9 (L) 11/04/2023   FERRITIN 57 11/04/2023     Will arrange patient to get IV Venofer weekly x 2  Gastroesophageal reflux disease  history of GERD/esophageal stricture Stable improved symptoms since alcohol cessation.   Tobacco use Smoking cessation was discussed.   Orders Placed This Encounter  Procedures   CBC with Differential (Cancer Center Only)    Standing Status:   Future    Expected Date:   05/03/2024    Expiration Date:   11/03/2024   Ferritin    Standing Status:   Future    Expected Date:   05/03/2024    Expiration Date:   11/03/2024   Iron and TIBC    Standing Status:   Future    Expected Date:   05/03/2024    Expiration Date:   11/03/2024   Follow up in 6 months.  All questions were answered. The patient knows to call the clinic with any problems, questions or concerns.  Rickard Patience, MD, PhD [facility] Hematology Oncology 11/04/2023   CHIEF COMPLAINTS/REASON FOR VISIT:  History of iron deficiency anemia, erythrocytosis.  HISTORY OF PRESENTING ILLNESS:  Justin Powers is a  33 y.o.  male with PMH listed below who was referred to me for evaluation of anemia Patient recently presented to ED after an episode of "upper chest tightness". Reports having multiple episodes for the past few months.  In ED, he had negative cardiology work up including negative EKG, troponin, hemoglobin was noted to be low at 10.6, microcytic.  He was sent home with presumed diagnosis of iron deficiency anemia and prescribed with iron supplements.  Also reports fatigue, progressively worsened  lately.  He followed up with PCP and had additional work up done.  Reviewed patient's recent labs that was done at [facility] office. Labs revealed anemia with hemoglobin of 12, MCV 68, platelet count 378,000,  Differential showed increased basophils,  Iron panel showed saturation of 68%, iron 363, TIBC 537, normal TSH Reviewed patient's previous labs ordered by primary care physician's office, anemia is chronic onset , duration is since  No aggravating or improving factors.   #Previous gastroenterology work-up # EGD and colonoscopy on 11/08/2018 Non-bleeding internal hemorrhoids. - The examined portion of the ileum was normal. Biopsied. - The entire examined colon is normal. Biopsied. - The examination was otherwise normal. .A large hiatal hernia was present, LA Grade D (one or more mucosal breaks involving at least 75% of esophageal circumference) esophagitis with bleeding was found in the lower third of the esophagus. Normal examined duodenum. Biopsied.   EGD on 03/06/2019 which showed large hiatal hernia, benign-appearing esophageal stenosis.  Dilated and biopsied.  Esophagitis.  Biopsy results showed stratified squamous epithelium with rare intraepithelial eosinophils and neutrophils, parakeratosis and a basal hyperplasia.  Negative for dysplasia and malignancy. #He had a repeat EGD on 7/14/2020Which showed large hiatal hernia, benign appearance esophageal stenosis.  Dilated.  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  INTERVAL HISTORY Justin Powers is a 33 y.o. male who has above history reviewed by me today presents for follow up visit for iron deficiency  Recent hospitalization in Nov  2024 due to possible leg cellulitis.  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  He has lost weight internationally.    Review of Systems  Constitutional:  Negative for appetite change, chills,  fatigue, fever and unexpected weight change.  HENT:   Negative for hearing loss and voice change.   Eyes:  Negative for eye problems and icterus.  Respiratory:  Negative for chest tightness, cough and shortness of breath.   Cardiovascular:  Negative for chest pain and leg swelling.  Gastrointestinal:  Negative for abdominal distention, abdominal pain, blood in stool and diarrhea.  Endocrine: Negative for hot flashes.  Genitourinary:  Negative for difficulty urinating, dysuria and frequency.   Musculoskeletal:  Negative for arthralgias.  Skin:  Negative for itching.       Skin inflammation  Neurological:  Negative for headaches, light-headedness and numbness.  Hematological:  Negative for adenopathy. Does not bruise/bleed easily.  Psychiatric/Behavioral:  Negative for confusion. The patient is not nervous/anxious.     MEDICAL HISTORY:  Past Medical History:  Diagnosis Date   Anemia    Hypertension     SURGICAL HISTORY: Past Surgical History:  Procedure Laterality Date   COLONOSCOPY WITH PROPOFOL N/A 11/08/2018   Procedure: COLONOSCOPY WITH PROPOFOL;  Surgeon: [name], MD;  Location: [facility] ENDOSCOPY;  Service: Gastroenterology;  Laterality: N/A;   ESOPHAGOGASTRODUODENOSCOPY (EGD) WITH PROPOFOL N/A 11/08/2018   Procedure: ESOPHAGOGASTRODUODENOSCOPY (EGD) WITH PROPOFOL;  Surgeon: [name], MD;  Location: [facility] ENDOSCOPY;  Service: Gastroenterology;  Laterality: N/A;   ESOPHAGOGASTRODUODENOSCOPY (EGD) WITH PROPOFOL N/A 03/06/2019   Procedure: ESOPHAGOGASTRODUODENOSCOPY (EGD) WITH PROPOFOL with Dilation;  Surgeon: [name], MD;  Location[facility] ENDOSCOPY;  Service: Gastroenterology;  Laterality: N/A;   ESOPHAGOGASTRODUODENOSCOPY (EGD) WITH PROPOFOL N/A 05/01/2019   Procedure: ESOPHAGOGASTRODUODENOSCOPY (EGD) WITH PROPOFOL with Dilation;  Surgeon: [name], MD;  Location: [facility] ENDOSCOPY;  Service: Gastroenterology;  Laterality: N/A;   ESOPHAGOGASTRODUODENOSCOPY (EGD) WITH PROPOFOL  N/A 09/21/2019   Procedure: ESOPHAGOGASTRODUODENOSCOPY (EGD) WITH PROPOFOL with Dilation;  Surgeon: [name], MD;  Location: [facility] ENDOSCOPY;  Service: Gastroenterology;  Laterality: N/A;   wisdon tooth removal      SOCIAL HISTORY: Social History   Socioeconomic History   Marital status: Legally Separated    Spouse name: Not on file   Number of children: Not on file   Years of education: Not on file   Highest education level: Not on file  Occupational History   Not on file  Tobacco Use   Smoking status: Every Day    Current packs/day: 1.00    Types: Cigarettes   Smokeless tobacco: Never  Vaping Use   Vaping status: Never Used  Substance and Sexual Activity   Alcohol use: Not Currently    Comment: Occassionally    Drug use: Never   Sexual activity: Not on file  Other Topics Concern   Not on file  Social History Narrative   Not on file   Social Drivers of Health   Financial Resource Strain: Low Risk  (09/14/2023)   Received from [facility]   Overall Financial Resource Strain (CARDIA)    Difficulty of Paying Living Expenses: Not hard at all  Food Insecurity: No Food Insecurity (09/14/2023)   Received from [facility]   Hunger Vital Sign    Worried About Running Out of Food in the Last Year: Never true    Ran Out of Food in the Last Year: Never  true  Transportation Needs: No Transportation Needs (09/14/2023)   Received from [facility]    Lack of Transportation (Medical): No    Lack of Transportation (Non-Medical): No  Physical Activity: Not on file  Stress: Not on file  Social Connections: Not on file  Intimate Partner Violence: Not At Risk (09/11/2023)   Humiliation, Afraid, Rape, and Kick questionnaire    Fear of Current or Ex-Partner: No    Emotionally Abused: No    Physically Abused: No    Sexually Abused: No    FAMILY HISTORY: Family History  Problem Relation Age of Onset   Crohn's disease Mother     Hypertension Mother    Breast cancer Maternal Aunt    Bladder Cancer Maternal Grandfather    Cervical cancer Other    Non-Hodgkin's lymphoma Other     ALLERGIES:  has no known allergies.  MEDICATIONS:  Current Outpatient Medications  Medication Sig Dispense Refill   folic acid (FOLVITE) 1 MG tablet Take 1 tablet (1 mg total) by mouth daily. 30 tablet 1   lisinopril (ZESTRIL) 10 MG tablet Take 10 mg by mouth daily.     minocycline (DYNACIN) 100 MG tablet Take 100 mg by mouth 2 (two) times daily.     predniSONE (DELTASONE) 10 MG tablet Take 10 mg by mouth daily with breakfast.     propranolol (INDERAL) 40 MG tablet Take 40 mg by mouth 2 (two) times daily.     albuterol (VENTOLIN HFA) 108 (90 Base) MCG/ACT inhaler Inhale 1-2 puffs into the lungs every 4 (four) hours as needed for wheezing or shortness of breath. (Patient not taking: Reported on 09/29/2022)     clonazePAM (KLONOPIN) 0.5 MG tablet Take 0.5 mg by mouth daily as needed. (Patient not taking: Reported on 08/16/2022)     HYDROcodone-acetaminophen (NORCO/VICODIN) 5-325 MG tablet Take 1 tablet by mouth every 6 (six) hours as needed. (Patient not taking: Reported on 11/04/2023)     ondansetron (ZOFRAN) 8 MG tablet Take by mouth. (Patient not taking: Reported on 11/04/2023)     pantoprazole (PROTONIX) 40 MG tablet Take 1 tablet (40 mg total) by mouth daily. (Patient not taking: Reported on 03/31/2023) 30 tablet 2   No current facility-administered medications for this visit.     PHYSICAL EXAMINATION:  Vitals:   11/04/23 1050  BP: 112/68  Pulse: (!) 49  Resp: 18  Temp: 97.9 F (36.6 C)   Filed Weights   11/04/23 1050  Weight: 164 lb 3.2 oz (74.5 kg)    Physical Exam Constitutional:      General: He is not in acute distress.    Appearance: He is obese.  HENT:     Head: Normocephalic and atraumatic.  Eyes:     General: No scleral icterus.    Pupils: Pupils are equal, round, and reactive to light.  Cardiovascular:      Rate and Rhythm: Normal rate and regular rhythm.  Pulmonary:     Effort: Pulmonary effort is normal. No respiratory distress.  Abdominal:     General: Bowel sounds are normal. There is no distension.     Palpations: Abdomen is soft.  Musculoskeletal:        General: No deformity. Normal range of motion.     Cervical back: Normal range of motion and neck supple.  Skin:    General: Skin is warm and dry.     Coloration: Skin is not pale.     Findings: No  erythema or rash.  Neurological:     Mental Status: He is alert and oriented to person, place, and time. Mental status is at baseline.      LABORATORY DATA:  I have reviewed the data as listed    Latest Ref Rng & Units 11/04/2023   10:20 AM 09/12/2023    4:29 AM 09/11/2023    2:36 PM  CBC  WBC 4.0 - 10.5 K/uL 13.7  10.5  16.1   Hemoglobin 13.0 - 17.0 g/dL 38.7  56.4  33.2   Hematocrit 39.0 - 52.0 % 43.8  37.8  42.3   Platelets 150 - 400 K/uL 305  236  364       Latest Ref Rng & Units 11/04/2023   10:20 AM 09/12/2023    4:29 AM 09/11/2023    2:36 PM  CMP  Glucose 70 - 99 mg/dL 951  82  884   BUN 6 - 20 mg/dL 40  31  42   Creatinine 0.61 - 1.24 mg/dL 1.66  0.63  0.16   Sodium 135 - 145 mmol/L 136  135  132   Potassium 3.5 - 5.1 mmol/L 3.8  4.2  5.5   Chloride 98 - 111 mmol/L 101  103  98   CO2 22 - 32 mmol/L 26  26  26    Calcium 8.9 - 10.3 mg/dL 9.4  9.1  9.5   Total Protein 6.5 - 8.1 g/dL 7.3   8.6   Total Bilirubin 0.0 - 1.2 mg/dL 0.5   0.7   Alkaline Phos 38 - 126 U/L 59   87   AST 15 - 41 U/L 18   32   ALT 0 - 44 U/L 12   14    Lab Results  Component Value Date   IRON 34 (L) 11/04/2023   TIBC 365 11/04/2023   FERRITIN 57 11/04/2023

## 2023-11-04 NOTE — Assessment & Plan Note (Addendum)
Labs reviewed and discussed with patient Lab Results  Component Value Date   HGB 14.0 11/04/2023   TIBC 365 11/04/2023   IRONPCTSAT 9 (L) 11/04/2023   FERRITIN 57 11/04/2023     Will arrange patient to get IV Venofer weekly x 2

## 2023-11-07 ENCOUNTER — Telehealth: Payer: Self-pay

## 2023-11-07 NOTE — Telephone Encounter (Signed)
-----   Message from Rickard Patience sent at 11/04/2023  5:59 PM EST ----- Please arrange him to get IV venofer weekly x 2

## 2023-11-07 NOTE — Telephone Encounter (Signed)
Justin Powers can you please arrange patient to get IV Venofer weekly x 2 and notify patient of appointment dates and times.

## 2023-11-18 ENCOUNTER — Inpatient Hospital Stay: Payer: 59

## 2023-11-18 VITALS — BP 117/87 | HR 53 | Temp 95.5°F | Resp 18

## 2023-11-18 DIAGNOSIS — D509 Iron deficiency anemia, unspecified: Secondary | ICD-10-CM | POA: Diagnosis not present

## 2023-11-18 DIAGNOSIS — Z8639 Personal history of other endocrine, nutritional and metabolic disease: Secondary | ICD-10-CM

## 2023-11-18 MED ORDER — IRON SUCROSE 20 MG/ML IV SOLN
200.0000 mg | Freq: Once | INTRAVENOUS | Status: AC
  Administered 2023-11-18: 200 mg via INTRAVENOUS

## 2023-11-18 MED ORDER — SODIUM CHLORIDE 0.9% FLUSH
10.0000 mL | Freq: Once | INTRAVENOUS | Status: AC | PRN
  Administered 2023-11-18: 10 mL
  Filled 2023-11-18: qty 10

## 2023-11-25 ENCOUNTER — Inpatient Hospital Stay: Payer: 59 | Attending: Oncology

## 2023-11-25 VITALS — BP 114/87 | HR 57 | Temp 96.8°F | Resp 18

## 2023-11-25 DIAGNOSIS — D509 Iron deficiency anemia, unspecified: Secondary | ICD-10-CM | POA: Diagnosis present

## 2023-11-25 DIAGNOSIS — Z8639 Personal history of other endocrine, nutritional and metabolic disease: Secondary | ICD-10-CM

## 2023-11-25 MED ORDER — IRON SUCROSE 20 MG/ML IV SOLN
200.0000 mg | Freq: Once | INTRAVENOUS | Status: AC
  Administered 2023-11-25: 200 mg via INTRAVENOUS
  Filled 2023-11-25: qty 10

## 2023-11-25 MED ORDER — SODIUM CHLORIDE 0.9% FLUSH
10.0000 mL | Freq: Once | INTRAVENOUS | Status: AC | PRN
  Administered 2023-11-25: 10 mL
  Filled 2023-11-25: qty 10

## 2023-11-29 IMAGING — US US ABDOMEN LIMITED
1 series · 14 of 25 positions shown · non-contrast
Comparison: 03/06/2016 ultrasound

CLINICAL DATA: 31-year-old male with abnormal LFTs.

EXAM:
ULTRASOUND ABDOMEN LIMITED RIGHT UPPER QUADRANT

[Series 1: us abdomen limited ruq (liver/gb) · 14 of 67 slices shown]
[im 1/67]
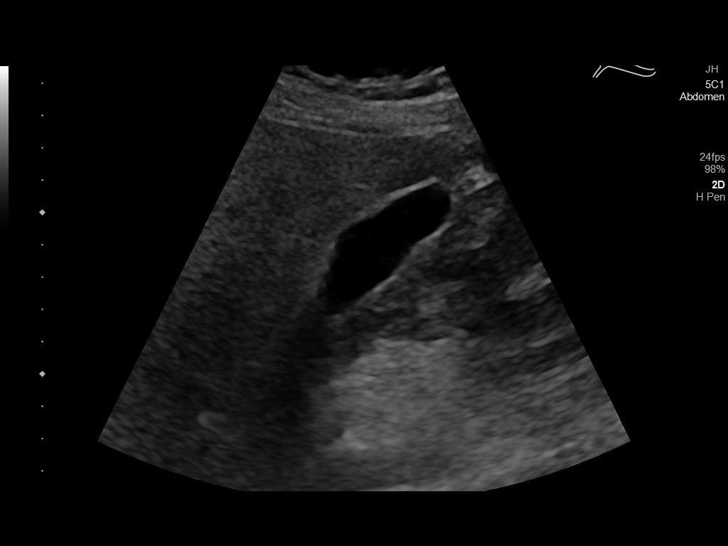
[im 6/67]
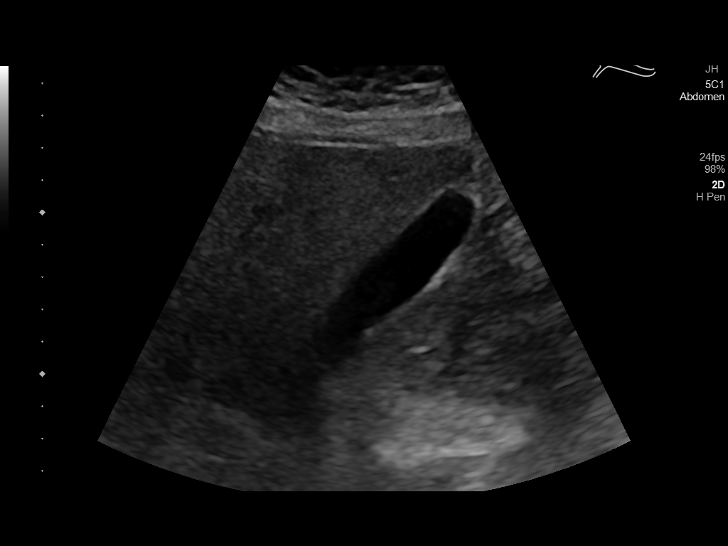
[im 12/67]
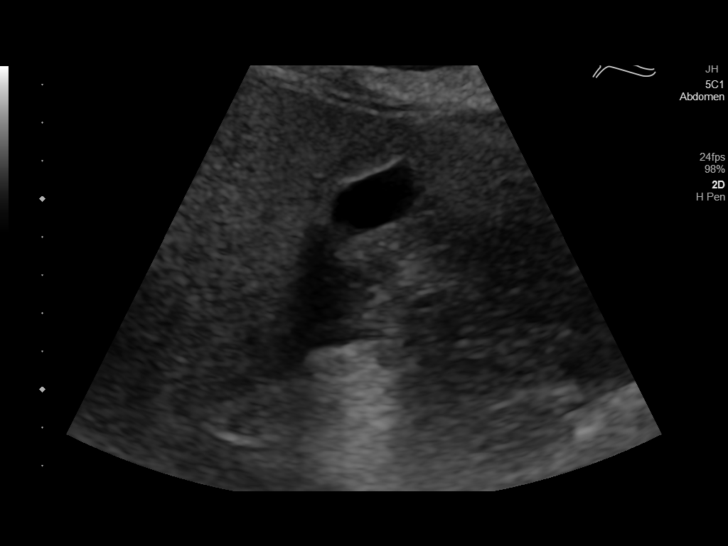
[im 17/67]
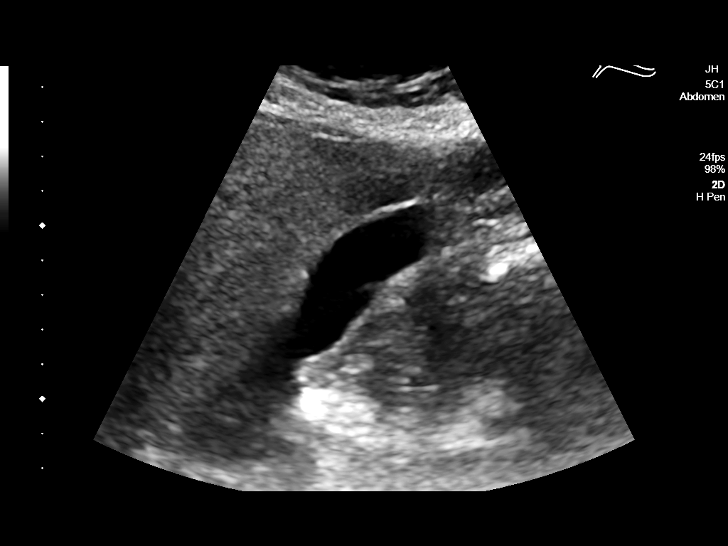
[im 23/67]
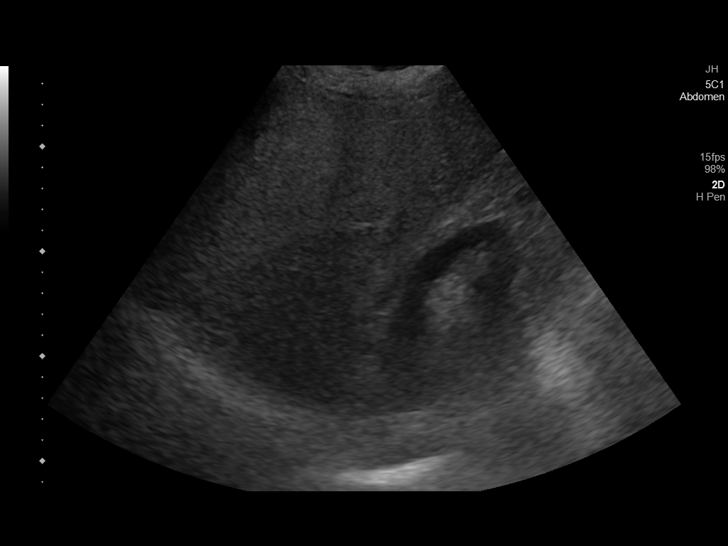
[im 25/67]
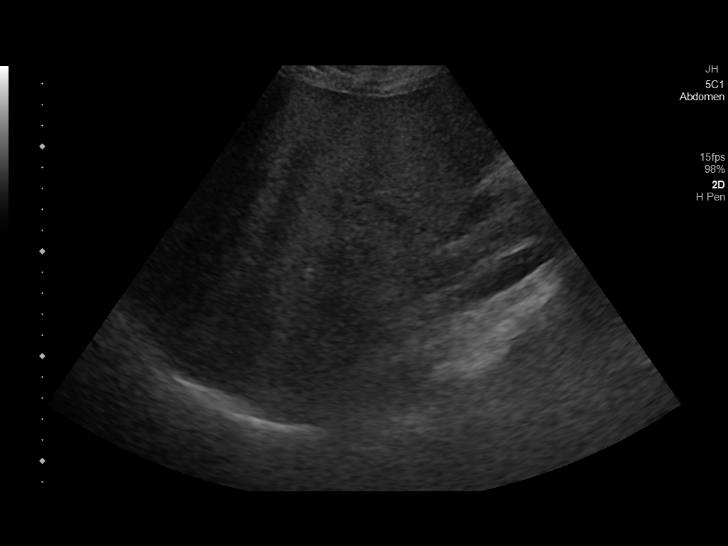
[im 31/67]
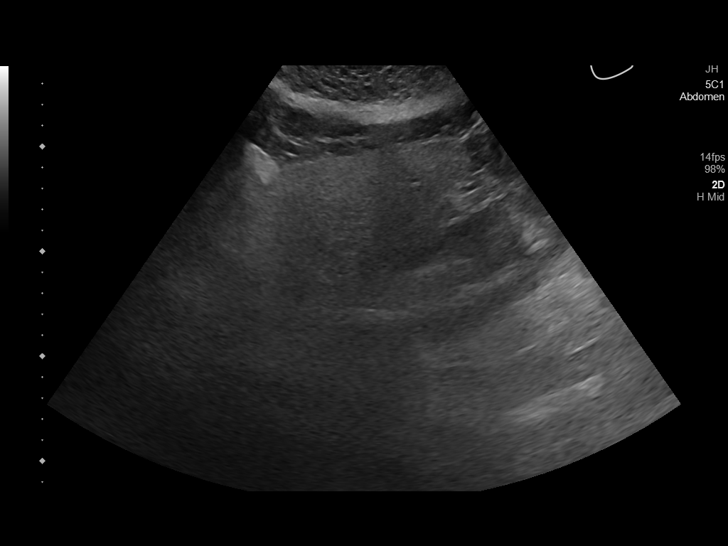
[im 36/67]
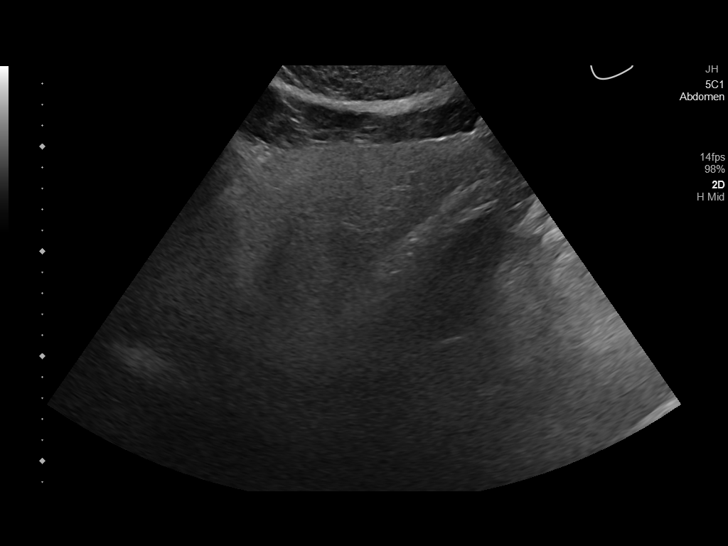
[im 42/67]
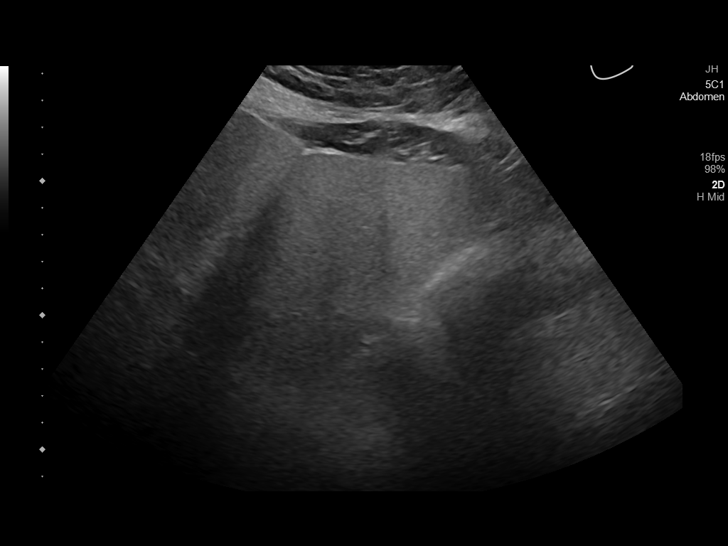
[im 45/67]
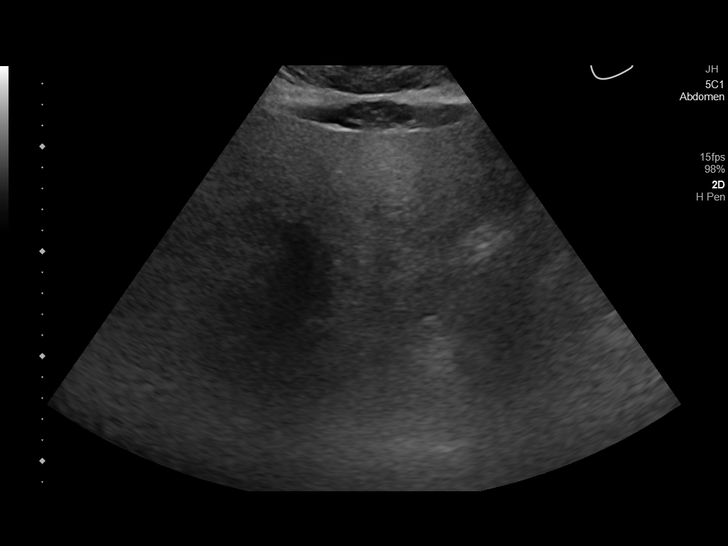
[im 50/67]
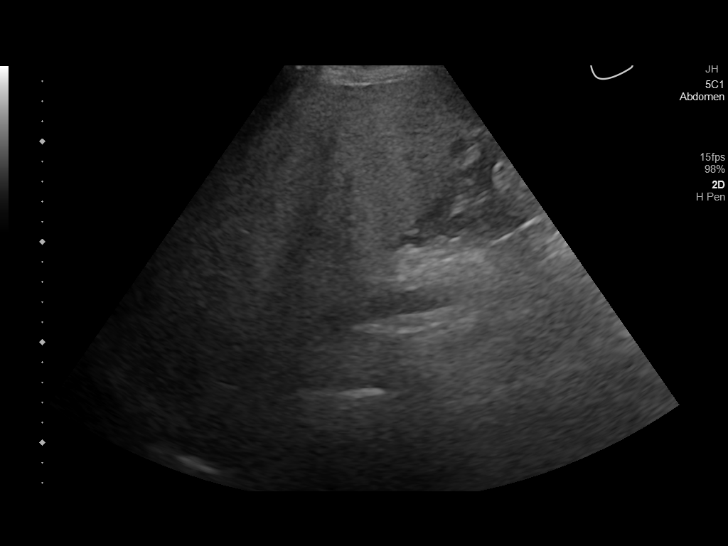
[im 56/67]
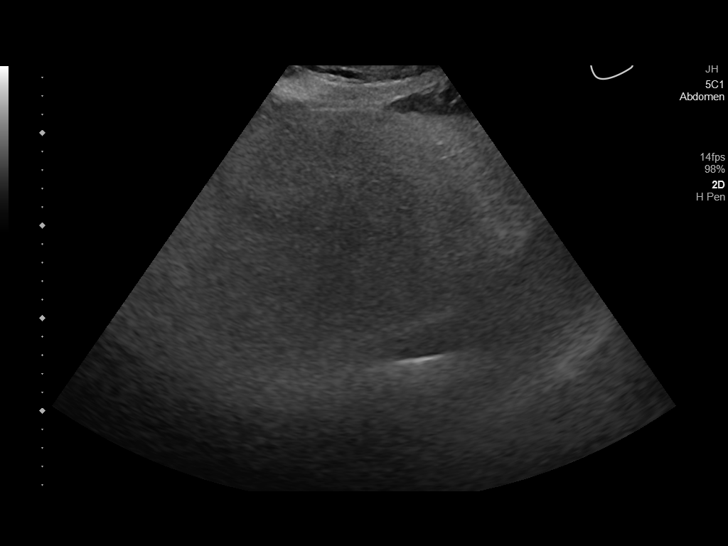
[im 61/67]
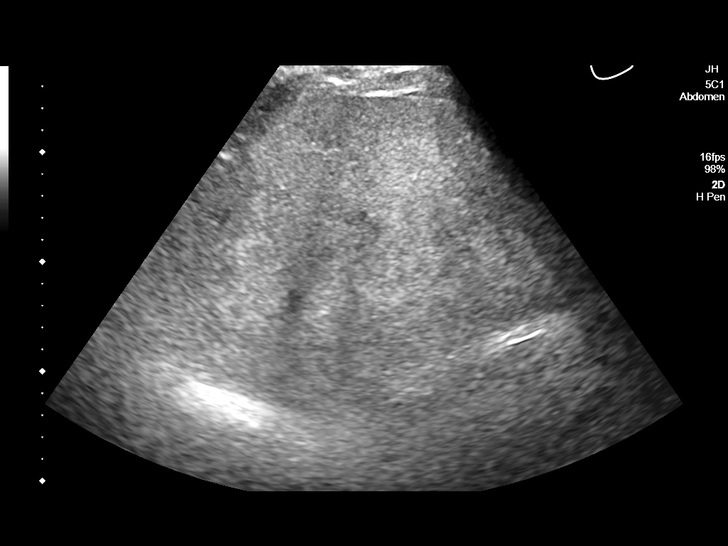
[im 67/67]
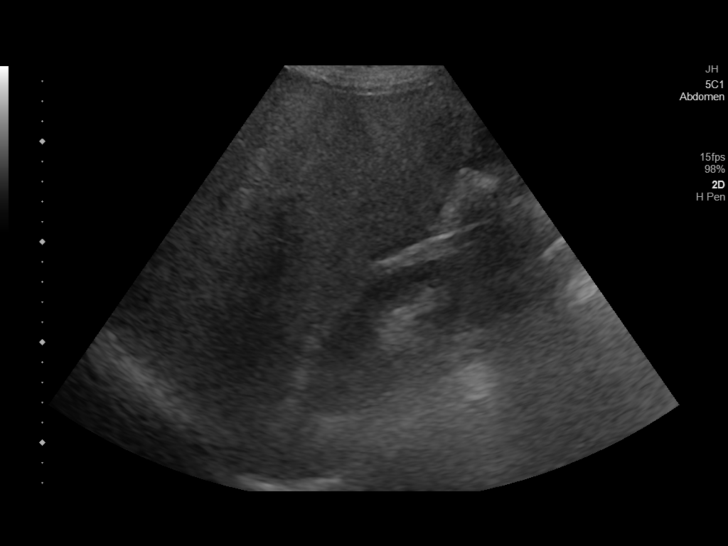

[14 of 25 positions shown; findings below may reference images not displayed]

FINDINGS: Gallbladder:

The gallbladder is unremarkable. There is no evidence of
cholelithiasis or acute cholecystitis.

Common bile duct:

Diameter: 3 mm. There is no evidence of intrahepatic or extrahepatic
biliary dilatation.

Liver:

Moderate diffuse increase in hepatic echogenicity is noted
compatible with hepatic steatosis. No other hepatic abnormalities
are identified. Portal vein is patent on color Doppler imaging with
normal direction of blood flow towards the liver.

Other: None.
IMPRESSION: 1. Moderate hepatic steatosis.
2. Unremarkable gallbladder. No evidence of biliary dilatation.

## 2023-12-13 ENCOUNTER — Ambulatory Visit: Payer: 59 | Admitting: Urology

## 2023-12-13 VITALS — BP 124/73 | HR 76 | Ht 67.0 in | Wt 160.0 lb

## 2023-12-13 DIAGNOSIS — Z3009 Encounter for other general counseling and advice on contraception: Secondary | ICD-10-CM | POA: Diagnosis not present

## 2023-12-13 NOTE — Progress Notes (Incomplete)
 I,Justin Powers,acting as a scribe for Justin Scotland, MD.,have documented all relevant documentation on the behalf of Justin Scotland, MD,as directed by  Justin Scotland, MD while in the presence of Justin Scotland, MD.  12/13/2023 3:37 PM   Justin Powers 1991-04-06 [id number]  Referring provider: Armando Gang, FNP [address] [place],  [place] [address]  Chief Complaint  Patient presents with   Establish Care   VAS Consult    HPI: 33 y.o. male referred for further evaluation of possible vasectomy.  He denies a history of testicular trauma or pain.  No urinary issues.  No previous scrotal surgeries.  He has three biological children, desires no more.  He has Hidradenitis Suppurativa and is managed by dermatologist. He wonders if this will affect him getting a vasectomy.    PMH: Past Medical History:  Diagnosis Date   Anemia    Hypertension     Surgical History: Past Surgical History:  Procedure Laterality Date   COLONOSCOPY WITH PROPOFOL N/A 11/08/2018   Procedure: COLONOSCOPY WITH PROPOFOL;  Surgeon: [name], MD;  Location: [facility] ENDOSCOPY;  Service: Gastroenterology;  Laterality: N/A;   ESOPHAGOGASTRODUODENOSCOPY (EGD) WITH PROPOFOL N/A 11/08/2018   Procedure: ESOPHAGOGASTRODUODENOSCOPY (EGD) WITH PROPOFOL;  Surgeon: [name], MD;  Location: [facility] ENDOSCOPY;  Service: Gastroenterology;  Laterality: N/A;   ESOPHAGOGASTRODUODENOSCOPY (EGD) WITH PROPOFOL N/A 03/06/2019   Procedure: ESOPHAGOGASTRODUODENOSCOPY (EGD) WITH PROPOFOL with Dilation;  Surgeon: [name], MD;  Location: [facility] ENDOSCOPY;  Service: Gastroenterology;  Laterality: N/A;   ESOPHAGOGASTRODUODENOSCOPY (EGD) WITH PROPOFOL N/A 05/01/2019   Procedure: ESOPHAGOGASTRODUODENOSCOPY (EGD) WITH PROPOFOL with Dilation;  Surgeon: [name], MD;  Location: [facility] ENDOSCOPY;  Service: Gastroenterology;  Laterality: N/A;   ESOPHAGOGASTRODUODENOSCOPY (EGD) WITH PROPOFOL N/A 09/21/2019   Procedure:  ESOPHAGOGASTRODUODENOSCOPY (EGD) WITH PROPOFOL with Dilation;  Surgeon: [name], MD;  Location: [facility] ENDOSCOPY;  Service: Gastroenterology;  Laterality: N/A;   wisdon tooth removal      Home Medications:  Allergies as of 12/13/2023   No Known Allergies      Medication List        Accurate as of December 13, 2023  3:37 PM. If you have any questions, ask your nurse or doctor.          STOP taking these medications    albuterol 108 (90 Base) MCG/ACT inhaler Commonly known as: VENTOLIN HFA   clonazePAM 0.5 MG tablet Commonly known as: KLONOPIN   HYDROcodone-acetaminophen 5-325 MG tablet Commonly known as: NORCO/VICODIN   pantoprazole 40 MG tablet Commonly known as: PROTONIX       TAKE these medications    folic acid 1 MG tablet Commonly known as: FOLVITE Take 1 tablet (1 mg total) by mouth daily.   lisinopril 10 MG tablet Commonly known as: ZESTRIL Take 10 mg by mouth daily.   minocycline 100 MG tablet Commonly known as: DYNACIN Take 100 mg by mouth 2 (two) times daily.   ondansetron 8 MG tablet Commonly known as: ZOFRAN Take by mouth.   predniSONE 10 MG tablet Commonly known as: DELTASONE Take 10 mg by mouth daily with breakfast.   propranolol 40 MG tablet Commonly known as: INDERAL Take 40 mg by mouth 2 (two) times daily.        Family History: Family History  Problem Relation Age of Onset   Crohn's disease Mother    Hypertension Mother    Breast cancer Maternal Aunt    Bladder Cancer Maternal Grandfather    Cervical cancer Other    Non-Hodgkin's  lymphoma Other     Social History:  reports that he has been smoking cigarettes. He has never used smokeless tobacco. He reports that he does not currently use alcohol. He reports that he does not use drugs.   Physical Exam: BP 124/73   Pulse 76   Ht 5\' 7"  (1.702 m)   Wt 160 lb (72.6 kg)   BMI 25.06 kg/m   Constitutional:  Alert and oriented, No acute distress. HEENT: [place] AT, moist  mucus membranes.  Trachea midline, no masses. Cardiovascular: No clubbing, cyanosis, or edema. Respiratory: Normal respiratory effort, no increased work of breathing. GI: Abdomen is soft, nontender, nondistended, no abdominal masses GU: Normal phallus.  Bilateral descended testicles.  Vasa easily palpable bilaterally. 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 Skin: No rashes, bruises or suspicious lesions. Neurologic: Grossly intact, no focal deficits, moving all 4 extremities. Psychiatric: Normal mood and affect.   Assessment & Plan:    1. Vasectomy evaluation Today, we discussed what the vas deferens is, where it is located, and its function. We reviewed the procedure for vasectomy, it's risks, benefits, alternatives, and likelihood of achieving his goals. We discussed in detail the procedure, complications, and recovery as well as the need for clearance prior to unprotected intercourse. We discussed that vasectomy does not protect against sexually transmitted diseases. We discussed that this procedure does not result in immediate sterility and that they would need to use other forms of birth control until he has been cleared with negative postvasectomy semen analyses. I explained that the procedure is considered to be permanent and that attempts at reversal have varying degrees of success. These options include vasectomy reversal, sperm retrieval, and in vitro fertilization; these can be very expensive. We discussed the chance of postvasectomy pain syndrome which occurs in less than 5% of patients. I explained to the patient that there is no treatment to resolve this chronic pain, and that if it developed I would not be able to help resolve the issue, but that surgery is generally not needed for correction. I explained there have even been reports of systemic like illness associated  with this chronic pain, and that there was no good cure. I explained that vasectomy it is not a 100% reliable form of birth control, and the risk of pregnancy after vasectomy is approximately 1 in 2000 men who had a negative postvasectomy semen analysis or rare non-motile sperm. I explained that repeat vasectomy was necessary in less than 1% of vasectomy procedures when employing the type of technique that I use. I explained that he should refrain from ejaculation for approximately one week following vasectomy. I explained that there are other options for birth control which are permanent and non-permanent; we discussed these. I explained the rates of surgical complications, such as symptomatic hematoma or infection, are low (1-2%) and vary with the surgeon's experience and criteria used to diagnose the complication.  He had the opportunity to ask questions to his stated satisfaction. He voiced understanding of the above factors and stated that he has read all the information provided to him and the packets and informed consent. I see no contraindications for the vasectomy based on his current state of disease. He is interested in receiving of Valium 10 mg prior to the procedure for the purpose of anxiolysis.  A prescription was given today.  He will have a  driver on the day of the procedure.  No follow-ups on file.  [facility] [address] [place], [place] [address] [phone]

## 2023-12-13 NOTE — Patient Instructions (Signed)
 Pre-Vasectomy Instructions ? ?STOP all aspirin or blood thinners (Aspirin, Plavix, Coumadin, Warfarin, Motrin, Ibuprofen, Advil, Aleve, Naproxen, Naprosyn) for 7 days prior to the procedure.  If you have any questions about stopping these medications please contact your primary care physician or cardiologist. ? ?Shave all hair from the upper scrotum on the day of the procedure.  This means just under the penis onto the scrotal sac.  The area shaved should measure about 2-3 inches around.  You may lather the scrotum with soap and water, and shave with a safety razor. ? ?After shaving the area, thoroughly wash the penis and the scrotum, then shower or bathe to remove all the loose hairs.  If needed, wash the area again just before coming in for your Vasectomy. ? ?It is recommended to have a light meal an hour or so prior to the procedure. ? ?Bring a scrotal support (jock strap or suspensory, or tight jockey shorts or underwear).  Wear comfortable pants or shorts. ? ?While the actual procedure usually takes about 45 minutes, you should be prepared to stay in the office for approximately one hour.  Bring someone with you to drive you home. ? ?If you have any questions or concerns, please feel free to call the office at [phone]. ?  ?

## 2023-12-14 MED ORDER — DIAZEPAM 10 MG PO TABS: 10.0000 mg | ORAL_TABLET | Freq: Once | ORAL | 0 refills | Status: AC

## 2024-01-27 ENCOUNTER — Encounter: Payer: Self-pay | Admitting: Urology

## 2024-01-27 ENCOUNTER — Ambulatory Visit (INDEPENDENT_AMBULATORY_CARE_PROVIDER_SITE_OTHER): Payer: 59 | Admitting: Urology

## 2024-01-27 VITALS — BP 116/69 | HR 66 | Ht 67.0 in | Wt 160.0 lb

## 2024-01-27 DIAGNOSIS — Z302 Encounter for sterilization: Secondary | ICD-10-CM | POA: Diagnosis not present

## 2024-01-27 NOTE — Patient Instructions (Signed)

## 2024-01-27 NOTE — Progress Notes (Signed)
 01/27/24  CC:  Chief Complaint  Patient presents with   VAS    HPI: 32 presents today for tomorrow.  Blood pressure 116/69, pulse 66, height 5\' 7"  (1.702 m), weight 160 lb (72.6 kg). NED. A&Ox3.   No respiratory distress   Abd soft, NT, ND Normal external genitalia with patent urethral meatus  A timeout was performed.  Patient's identity and consent was confirmed.  All questions were answered.   Bilateral Vasectomy Procedure  Pre-Procedure: - Patient's scrotum was prepped and draped for vasectomy. - The vas was palpated through the scrotal skin on the left. - 1% Xylocaine was injected into the skin and surrounding tissue for placement  - In a similar manner, the vas on the right was identified, anesthetized, and stabilized.  Procedure: - A #11 blade was used to make a small stab incision in the skin overlying the vas - The left vas was isolated and brought up through the incision exposing that structure. - Bleeding points were cauterized as they occurred. - The vas was free from the surrounding structures and brought to the view. - A segment was positioned for placement with a hemostat. - A second hemostat was placed and a small segment between the two hemostats and was removed for inspection. - Each end of the transected vas lumen was fulgurated/ obliterated using needlepoint electrocautery -A fascial interposition was performed on testicular end of the vas using #3-0 chromic suture -The same procedure was performed on the right. - A single suture of #3-0 chromic catgut was used to close each lateral scrotal skin incision - A dressing was applied.  Post-Procedure: - Patient was instructed in care of the operative area - A specimen is to be delivered in 12 weeks   -Another form of contraception is to be used until post vasectomy semen analysis  Vanna Scotland, MD

## 2024-02-04 ENCOUNTER — Encounter: Payer: Self-pay | Admitting: Urology

## 2024-03-27 ENCOUNTER — Ambulatory Visit: Admission: EM | Admit: 2024-03-27 | Discharge: 2024-03-27 | Disposition: A | Discharge status: Home / Self Care

## 2024-03-27 DIAGNOSIS — M793 Panniculitis, unspecified: Secondary | ICD-10-CM

## 2024-03-27 MED ORDER — MUPIROCIN 2 % EX OINT: 1.0000 | TOPICAL_OINTMENT | Freq: Two times a day (BID) | CUTANEOUS | 0 refills | Status: AC

## 2024-03-27 MED ORDER — PREDNISONE 10 MG (21) PO TBPK: ORAL_TABLET | ORAL | 0 refills | Status: AC

## 2024-03-27 NOTE — ED Triage Notes (Addendum)
 Pt c/o lesion in R lower leg x2 mon. States red,tender & swollen. Followed by derm but unable to reach today. Has had this spot on L leg in the past & treated with IM kenalog  & oral pred. Hx of HS.

## 2024-03-27 NOTE — ED Provider Notes (Signed)
 [facility]    CSN: [id number] Arrival date & time: 03/27/24  0840      History   Chief Complaint Chief Complaint  Patient presents with   Skin Problem    HPI Densil Ottey is a 33 y.o. male.   HPI  33 year old male with past medical history significant for hypertension, anemia, hidradenitis suppurativa, and panniculitis presents for evaluation of a lesion on the medial aspect of his right lower leg that has been present for the last 2 months.  It is red, tender, swollen, but no discharge and no systemic fever.  He is followed by [facility] dermatology and is supposed to be on Humira but has not started it yet.  He had a similar lesion on his left leg that was treated with IM injection of Kenalog  and oral prednisone  in the past.  Past Medical History:  Diagnosis Date   Anemia    Hidradenitis suppurativa    Hypertension     Patient Active Problem List   Diagnosis Date Noted   Erythema of skin over lesion 09/13/2023   Fatigue 09/13/2023   Hidradenitis suppurativa 09/13/2023   History of iron  deficiency anemia 09/13/2023   Leukocytosis 09/13/2023   Left leg cellulitis 09/11/2023   Iron  deficiency 09/29/2022   Tobacco use disorder 09/29/2022   Erythrocytosis 09/29/2022   Gastroesophageal reflux disease 09/29/2022   Anxiety 08/03/2022   Folate deficiency 08/02/2022   Back pain 08/02/2022   AKI (acute kidney injury) (HCC) 08/01/2022   Acute pancreatitis 07/31/2022   Abdominal pain 07/31/2022   Nausea 07/31/2022   Hypomagnesemia 07/31/2022   Hypokalemia 07/31/2022   Hypophosphatemia 07/31/2022   Hypercalcemia 07/31/2022   Acute kidney injury superimposed on chronic kidney disease (HCC) 07/31/2022   Essential hypertension 07/31/2022   Mixed hyperlipidemia 07/31/2022   Alcohol abuse 07/31/2022   Elevated MCV 07/31/2022   S/P repair of paraesophageal hernia 04/28/2021   Hiatal hernia 08/19/2020   Stricture and stenosis of esophagus    Dysphagia    Erosive  esophagitis    History of iron  deficiency 10/06/2018    Past Surgical History:  Procedure Laterality Date   COLONOSCOPY WITH PROPOFOL  N/A 11/08/2018   Procedure: COLONOSCOPY WITH PROPOFOL ;  Surgeon: Luke Salaam, MD;  Location: [facility] ENDOSCOPY;  Service: Gastroenterology;  Laterality: N/A;   ESOPHAGOGASTRODUODENOSCOPY (EGD) WITH PROPOFOL  N/A 11/08/2018   Procedure: ESOPHAGOGASTRODUODENOSCOPY (EGD) WITH PROPOFOL ;  Surgeon: Luke Salaam, MD;  Location: [facility] ENDOSCOPY;  Service: Gastroenterology;  Laterality: N/A;   ESOPHAGOGASTRODUODENOSCOPY (EGD) WITH PROPOFOL  N/A 03/06/2019   Procedure: ESOPHAGOGASTRODUODENOSCOPY (EGD) WITH PROPOFOL  with Dilation;  Surgeon: Luke Salaam, MD;  Location: [facility] ENDOSCOPY;  Service: Gastroenterology;  Laterality: N/A;   ESOPHAGOGASTRODUODENOSCOPY (EGD) WITH PROPOFOL  N/A 05/01/2019   Procedure: ESOPHAGOGASTRODUODENOSCOPY (EGD) WITH PROPOFOL  with Dilation;  Surgeon: Luke Salaam, MD;  Location: [facility] ENDOSCOPY;  Service: Gastroenterology;  Laterality: N/A;   ESOPHAGOGASTRODUODENOSCOPY (EGD) WITH PROPOFOL  N/A 09/21/2019   Procedure: ESOPHAGOGASTRODUODENOSCOPY (EGD) WITH PROPOFOL  with Dilation;  Surgeon: Luke Salaam, MD;  Location: [facility] ENDOSCOPY;  Service: Gastroenterology;  Laterality: N/A;   VASECTOMY     wisdon tooth removal         Home Medications    Prior to Admission medications   Medication Sig Start Date End Date Taking? Authorizing Provider  lisinopril  (ZESTRIL ) 10 MG tablet Take 10 mg by mouth daily. 07/07/22  Yes [provider]  minocycline (DYNACIN) 100 MG tablet Take 100 mg by mouth 2 (two) times daily.   Yes [provider]  mupirocin ointment Renita Carwin)  2 % Apply 1 Application topically 2 (two) times daily. 03/27/24  Yes Kent Pear, NP  ondansetron  (ZOFRAN ) 8 MG tablet Take 8 mg by mouth as needed. 02/16/21  Yes [provider]  predniSONE  (STERAPRED UNI-PAK 21 TAB) 10 MG (21) TBPK tablet Take 6 tablets on day 1, 5 tablets  day 2, 4 tablets day 3, 3 tablets day 4, 2 tablets day 5, 1 tablet day 6 03/27/24  Yes Kent Pear, NP  propranolol  (INDERAL ) 40 MG tablet Take 40 mg by mouth 2 (two) times daily. 02/08/21  Yes [provider]    Family History Family History  Problem Relation Age of Onset   Crohn's disease Mother    Hypertension Mother    Breast cancer Maternal Aunt    Bladder Cancer Maternal Grandfather    Cervical cancer Other    Non-Hodgkin's lymphoma Other     Social History Social History   Tobacco Use   Smoking status: Every Day    Current packs/day: 1.00    Types: Cigarettes   Smokeless tobacco: Never  Vaping Use   Vaping status: Never Used  Substance Use Topics   Alcohol use: Not Currently    Comment: Occassionally    Drug use: Never     Allergies   Patient has no known allergies.   Review of Systems Review of Systems  Constitutional:  Negative for fever.  Skin:  Positive for color change and wound.     Physical Exam Triage Vital Signs ED Triage Vitals  Encounter Vitals Group     BP      Systolic BP Percentile      Diastolic BP Percentile      Pulse      Resp      Temp      Temp src      SpO2      Weight      Height      Head Circumference      Peak Flow      Pain Score      Pain Loc      Pain Education      Exclude from Growth Chart    No data found.  Updated Vital Signs BP 116/74 (BP Location: Right Arm)   Pulse (!) 51   Temp 98.4 F (36.9 C) (Oral)   Resp 16   Ht 5\' 7"  (1.702 m)   Wt 155 lb (70.3 kg)   SpO2 98%   BMI 24.28 kg/m   Visual Acuity Right Eye Distance:   Left Eye Distance:   Bilateral Distance:    Right Eye Near:   Left Eye Near:    Bilateral Near:     Physical Exam Vitals and nursing note reviewed.  Constitutional:      Appearance: Normal appearance. He is not ill-appearing.  HENT:     Head: Normocephalic and atraumatic.  Skin:    General: Skin is warm and dry.     Capillary Refill: Capillary refill takes  less than 2 seconds.     Findings: Erythema and lesion present.  Neurological:     General: No focal deficit present.     Mental Status: He is alert and oriented to person, place, and time.      UC Treatments / Results  Labs (all labs ordered are listed, but only abnormal results are displayed) Labs Reviewed - No data to display  EKG   Radiology No results found.  Procedures Procedures (including critical care time)  Medications Ordered in UC Medications - No data to display  Initial Impression / Assessment and Plan / UC Course  I have reviewed the triage vital signs and the nursing notes.  Pertinent labs & imaging results that were available during my care of the patient were reviewed by me and considered in my medical decision making (see chart for details).   Patient is a nontoxic-appearing 33 year old male presenting for evaluation of skin lesion as outlined in HPI above.  As you can see noted above, there is erythema and ecchymosis to the surrounding tissue with erythema in the center.  There are open areas with yellow slough.  The patient is covering with Aquaphor and a nonstick dressing.  The lesion is most consistent with panniculitis.  He has been treated in the past with intralesional Kenalog  injection and prednisone .  I have explained to the patient that we do not have Kenalog  here in clinic that we can inject the area with.  I am concerned about bacterial secondary infection given the open skin lesions, yellow slough, and warm erythema to the perimeter.  Given his history of at bedtime I will treat him with topical mupirocin twice daily for 5 days given that he is currently taking oral minocycline 100 mg twice daily, as well as put him on a prednisone  taper.  I have encouraged him to reach back out to his dermatology office for an appointment.  I will also give him other dermatology contacts.   Final Clinical Impressions(s) / UC Diagnoses   Final diagnoses:   Panniculitis     Discharge Instructions      Stop applying the Aquaphor and apply the mupirocin ointment twice daily to the lesion on your right lower leg.  With the open areas there is concern for developing secondary bacterial infection.  Start taking the prednisone  according to the package instructions.  Continue your minocycline twice daily as prescribed.  Contact your dermatologist office, or reach out to Eveline Hipps, who is part of your care team through MyChart to request an appointment.  If you are unable to make an appointment with your dermatologist you can reach out to the dermatology and laser Center of [place] at [phone] and see if they can see you for evaluation and treatment.  If you develop any fever, increased redness, pus drainage from your lesion, or or red streaks going up your leg you need to return for reevaluation or seek care in the emergency department.   ED Prescriptions     Medication Sig Dispense Auth. Provider   predniSONE  (STERAPRED UNI-PAK 21 TAB) 10 MG (21) TBPK tablet Take 6 tablets on day 1, 5 tablets day 2, 4 tablets day 3, 3 tablets day 4, 2 tablets day 5, 1 tablet day 6 21 tablet Kent Pear, NP   mupirocin ointment (BACTROBAN) 2 % Apply 1 Application topically 2 (two) times daily. 22 g Kent Pear, NP      PDMP not reviewed this encounter.   Kent Pear, NP 03/27/24 [phone]

## 2024-03-27 NOTE — Discharge Instructions (Addendum)
 Stop applying the Aquaphor and apply the mupirocin ointment twice daily to the lesion on your right lower leg.  With the open areas there is concern for developing secondary bacterial infection.  Start taking the prednisone  according to the package instructions.  Continue your minocycline twice daily as prescribed.  Contact your dermatologist office, or reach out to Eveline Hipps, who is part of your care team through MyChart to request an appointment.  If you are unable to make an appointment with your dermatologist you can reach out to the [facility]ology and [facility] of [place] at [phone] and see if they can see you for evaluation and treatment.  If you develop any fever, increased redness, pus drainage from your lesion, or or red streaks going up your leg you need to return for reevaluation or seek care in the emergency department.

## 2024-04-23 ENCOUNTER — Other Ambulatory Visit

## 2024-04-23 DIAGNOSIS — Z302 Encounter for sterilization: Secondary | ICD-10-CM

## 2024-04-24 LAB — POST-VAS SPERM EVALUATION,QUAL: Volume: 1.2 mL

## 2024-05-03 ENCOUNTER — Inpatient Hospital Stay: Payer: 59 | Admitting: Oncology

## 2024-05-03 ENCOUNTER — Inpatient Hospital Stay: Payer: 59

## 2024-05-10 ENCOUNTER — Inpatient Hospital Stay

## 2024-05-14 ENCOUNTER — Inpatient Hospital Stay: Attending: Oncology

## 2024-05-14 DIAGNOSIS — Z8052 Family history of malignant neoplasm of bladder: Secondary | ICD-10-CM | POA: Insufficient documentation

## 2024-05-14 DIAGNOSIS — Z807 Family history of other malignant neoplasms of lymphoid, hematopoietic and related tissues: Secondary | ICD-10-CM | POA: Diagnosis not present

## 2024-05-14 DIAGNOSIS — G4700 Insomnia, unspecified: Secondary | ICD-10-CM | POA: Insufficient documentation

## 2024-05-14 DIAGNOSIS — Z8049 Family history of malignant neoplasm of other genital organs: Secondary | ICD-10-CM | POA: Insufficient documentation

## 2024-05-14 DIAGNOSIS — Z803 Family history of malignant neoplasm of breast: Secondary | ICD-10-CM | POA: Diagnosis not present

## 2024-05-14 DIAGNOSIS — F17210 Nicotine dependence, cigarettes, uncomplicated: Secondary | ICD-10-CM | POA: Diagnosis not present

## 2024-05-14 DIAGNOSIS — K219 Gastro-esophageal reflux disease without esophagitis: Secondary | ICD-10-CM | POA: Diagnosis not present

## 2024-05-14 DIAGNOSIS — E611 Iron deficiency: Secondary | ICD-10-CM | POA: Diagnosis present

## 2024-05-14 LAB — CBC WITH DIFFERENTIAL (CANCER CENTER ONLY)
Abs Immature Granulocytes: 0.11 K/uL — ABNORMAL HIGH (ref 0.00–0.07)
Basophils Absolute: 0 K/uL (ref 0.0–0.1)
Basophils Relative: 0 %
Eosinophils Absolute: 0.1 K/uL (ref 0.0–0.5)
Eosinophils Relative: 1 %
HCT: 38.9 % — ABNORMAL LOW (ref 39.0–52.0)
Hemoglobin: 12.7 g/dL — ABNORMAL LOW (ref 13.0–17.0)
Immature Granulocytes: 1 %
Lymphocytes Relative: 34 %
Lymphs Abs: 4 K/uL (ref 0.7–4.0)
MCH: 29.4 pg (ref 26.0–34.0)
MCHC: 32.6 g/dL (ref 30.0–36.0)
MCV: 90 fL (ref 80.0–100.0)
Monocytes Absolute: 0.9 K/uL (ref 0.1–1.0)
Monocytes Relative: 8 %
Neutro Abs: 6.6 K/uL (ref 1.7–7.7)
Neutrophils Relative %: 56 %
Platelet Count: 229 K/uL (ref 150–400)
RBC: 4.32 MIL/uL (ref 4.22–5.81)
RDW: 14 % (ref 11.5–15.5)
WBC Count: 11.7 K/uL — ABNORMAL HIGH (ref 4.0–10.5)
nRBC: 0 % (ref 0.0–0.2)

## 2024-05-14 LAB — IRON AND TIBC
Iron: 105 ug/dL (ref 45–182)
Saturation Ratios: 35 % (ref 17.9–39.5)
TIBC: 301 ug/dL (ref 250–450)
UIBC: 196 ug/dL

## 2024-05-14 LAB — FERRITIN: Ferritin: 158 ng/mL (ref 24–336)

## 2024-05-15 ENCOUNTER — Encounter: Payer: Self-pay | Admitting: Oncology

## 2024-05-15 ENCOUNTER — Inpatient Hospital Stay: Admitting: Oncology

## 2024-05-15 ENCOUNTER — Other Ambulatory Visit

## 2024-05-15 VITALS — BP 112/62 | HR 61 | Temp 97.2°F | Resp 18 | Wt 164.3 lb

## 2024-05-15 DIAGNOSIS — G4700 Insomnia, unspecified: Secondary | ICD-10-CM | POA: Diagnosis not present

## 2024-05-15 DIAGNOSIS — K219 Gastro-esophageal reflux disease without esophagitis: Secondary | ICD-10-CM | POA: Diagnosis not present

## 2024-05-15 DIAGNOSIS — E611 Iron deficiency: Secondary | ICD-10-CM | POA: Diagnosis not present

## 2024-05-15 MED ORDER — TRAZODONE HCL 50 MG PO TABS: 50.0000 mg | ORAL_TABLET | Freq: Every evening | ORAL | 0 refills | Status: AC | PRN

## 2024-05-15 NOTE — Progress Notes (Addendum)
 Hematology/Oncology follow up note [phone] [phone]   Patient Care Team: Patient, No Pcp Per as PCP - General (General Practice) Babara Call, MD as Consulting Physician (Oncology)  ASSESSMENT & PLAN:   Iron  deficiency Labs reviewed and discussed with patient Lab Results  Component Value Date   HGB 12.7 (L) 05/14/2024   TIBC 301 05/14/2024   IRONPCTSAT 35 05/14/2024   FERRITIN 158 05/14/2024   Hemoglobin iron  panel have been stable.  Observation  Will arrange patient to get IV Venofer  weekly x 2  Gastroesophageal reflux disease  history of GERD/esophageal stricture Stable improved symptoms since alcohol cessation.   Insomnia He has tried many over-the-counter options. Recommend trial of trazodone  50 mg nightly as needed.  If this helps his symptoms, he plans to further discuss with primary care provider for refills. Recommend patient to avoid caffeine, improve sleep hygiene   Orders Placed This Encounter  Procedures   CBC with Differential (Cancer Center Only)    Standing Status:   Future    Expected Date:   05/15/2025    Expiration Date:   05/15/2025   CMP (Cancer Center only)    Standing Status:   Future    Expected Date:   05/15/2025    Expiration Date:   05/15/2025   Iron  and TIBC    Standing Status:   Future    Expected Date:   05/15/2025    Expiration Date:   05/15/2025   Ferritin    Standing Status:   Future    Expected Date:   05/15/2025    Expiration Date:   05/15/2025   Retic Panel    Standing Status:   Future    Expected Date:   05/15/2025    Expiration Date:   05/15/2025   Follow up in 12 months.  All questions were answered. The patient knows to call the clinic with any problems, questions or concerns.  Call Babara, MD, PhD [facility] Hematology Oncology 05/15/2024   CHIEF COMPLAINTS/REASON FOR VISIT:  History of iron  deficiency anemia, erythrocytosis.  HISTORY OF PRESENTING ILLNESS:  Justin Powers is a  33 y.o.  male with  PMH listed below who was referred to me for evaluation of anemia Patient recently presented to ED after an episode of upper chest tightness. Reports having multiple episodes for the past few months.  In ED, he had negative cardiology work up including negative EKG, troponin, hemoglobin was noted to be low at 10.6, microcytic.  He was sent home with presumed diagnosis of iron  deficiency anemia and prescribed with iron  supplements.  Also reports fatigue, progressively worsened lately.  He followed up with PCP and had additional work up done.  Reviewed patient's recent labs that was done at [facility] office. Labs revealed anemia with hemoglobin of 12, MCV 68, platelet count 378,000,  Differential showed increased basophils,  Iron  panel showed saturation of 68%, iron  363, TIBC 537, normal TSH Reviewed patient's previous labs ordered by primary care physician's office, anemia is chronic onset , duration is since  No aggravating or improving factors.   #Previous gastroenterology work-up # EGD and colonoscopy on 11/08/2018 Non-bleeding internal hemorrhoids. - The examined portion of the ileum was normal. Biopsied. - The entire examined colon is normal. Biopsied. - The examination was otherwise normal. .A large hiatal hernia was present, LA Grade D (one or more mucosal breaks involving at least 75% of esophageal circumference) esophagitis with bleeding was found in the lower third of the esophagus. Normal examined duodenum. Biopsied.  EGD on 03/06/2019 which showed large hiatal hernia, benign-appearing esophageal stenosis.  Dilated and biopsied.  Esophagitis.  Biopsy results showed stratified squamous epithelium with rare intraepithelial eosinophils and neutrophils, parakeratosis and a basal hyperplasia.  Negative for dysplasia and malignancy. #He had a repeat EGD on 7/14/2020Which showed large hiatal hernia, benign appearance esophageal stenosis.  Dilated.  hospitalization due to alcohol induced  pancreatitis.  INTERVAL HISTORY Justin Powers is a 33 y.o. male who has above history reviewed by me today presents for follow up visit for iron  deficiency  He follows up with dermatology for panniculitis and hidradenitis suppurativa, patient is currently on Humira, prednisone  20 g daily.  Appetite is fair.  Weight is stable. Reports insomnia, he has tried multiple over-the-counter options including melatonin with about much success.   Review of Systems  Constitutional:  Negative for appetite change, chills, fatigue, fever and unexpected weight change.  HENT:   Negative for hearing loss and voice change.   Eyes:  Negative for eye problems and icterus.  Respiratory:  Negative for chest tightness, cough and shortness of breath.   Cardiovascular:  Negative for chest pain and leg swelling.  Gastrointestinal:  Negative for abdominal distention, abdominal pain, blood in stool and diarrhea.  Endocrine: Negative for hot flashes.  Genitourinary:  Negative for difficulty urinating, dysuria and frequency.   Musculoskeletal:  Negative for arthralgias.  Skin:  Positive for rash. Negative for itching.  Neurological:  Negative for headaches, light-headedness and numbness.  Hematological:  Negative for adenopathy. Does not bruise/bleed easily.  Psychiatric/Behavioral:  Positive for sleep disturbance. Negative for confusion. The patient is not nervous/anxious.     MEDICAL HISTORY:  Past Medical History:  Diagnosis Date   Anemia    Hidradenitis suppurativa    Hypertension     SURGICAL HISTORY: Past Surgical History:  Procedure Laterality Date   COLONOSCOPY WITH PROPOFOL  N/A 11/08/2018   Procedure: COLONOSCOPY WITH PROPOFOL ;  Surgeon: Therisa Bi, MD;  Location: [facility] ENDOSCOPY;  Service: Gastroenterology;  Laterality: N/A;   ESOPHAGOGASTRODUODENOSCOPY (EGD) WITH PROPOFOL  N/A 11/08/2018   Procedure: ESOPHAGOGASTRODUODENOSCOPY (EGD) WITH PROPOFOL ;  Surgeon: Therisa Bi, MD;  Location: [facility]  ENDOSCOPY;  Service: Gastroenterology;  Laterality: N/A;   ESOPHAGOGASTRODUODENOSCOPY (EGD) WITH PROPOFOL  N/A 03/06/2019   Procedure: ESOPHAGOGASTRODUODENOSCOPY (EGD) WITH PROPOFOL  with Dilation;  Surgeon: Therisa Bi, MD;  Location: [facility] ENDOSCOPY;  Service: Gastroenterology;  Laterality: N/A;   ESOPHAGOGASTRODUODENOSCOPY (EGD) WITH PROPOFOL  N/A 05/01/2019   Procedure: ESOPHAGOGASTRODUODENOSCOPY (EGD) WITH PROPOFOL  with Dilation;  Surgeon: Therisa Bi, MD;  Location: [facility] ENDOSCOPY;  Service: Gastroenterology;  Laterality: N/A;   ESOPHAGOGASTRODUODENOSCOPY (EGD) WITH PROPOFOL  N/A 09/21/2019   Procedure: ESOPHAGOGASTRODUODENOSCOPY (EGD) WITH PROPOFOL  with Dilation;  Surgeon: Therisa Bi, MD;  Location: [facility] ENDOSCOPY;  Service: Gastroenterology;  Laterality: N/A;   VASECTOMY     wisdon tooth removal      SOCIAL HISTORY: Social History   Socioeconomic History   Marital status: Legally Separated    Spouse name: Not on file   Number of children: Not on file   Years of education: Not on file   Highest education level: Not on file  Occupational History   Not on file  Tobacco Use   Smoking status: Every Day    Current packs/day: 1.00    Types: Cigarettes   Smokeless tobacco: Never  Vaping Use   Vaping status: Never Used  Substance and Sexual Activity   Alcohol use: Not Currently    Comment: Occassionally    Drug use: Never   Sexual activity: Yes  Birth control/protection: Surgical    Comment: vasectomy 01/27/24  Other Topics Concern   Not on file  Social History Narrative   Not on file   Social Drivers of Health   Financial Resource Strain: Low Risk  (09/14/2023)   Received from [facility]   Overall Financial Resource Strain (CARDIA)    Difficulty of Paying Living Expenses: Not hard at all  Food Insecurity: No Food Insecurity (09/14/2023)   Received from [facility]   Hunger Vital Sign    Within the past 12 months, you worried that your food would run out before  you got the money to buy more.: Never true    Within the past 12 months, the food you bought just didn't last and you didn't have money to get more.: Never true  Transportation Needs: No Transportation Needs (09/14/2023)   Received from [facility] - Transportation    Lack of Transportation (Medical): No    Lack of Transportation (Non-Medical): No  Physical Activity: Not on file  Stress: Not on file  Social Connections: Not on file  Intimate Partner Violence: Not At Risk (09/11/2023)   Humiliation, Afraid, Rape, and Kick questionnaire    Fear of Current or Ex-Partner: No    Emotionally Abused: No    Physically Abused: No    Sexually Abused: No    FAMILY HISTORY: Family History  Problem Relation Age of Onset   Crohn's disease Mother    Hypertension Mother    Breast cancer Maternal Aunt    Bladder Cancer Maternal Grandfather    Cervical cancer Other    Non-Hodgkin's lymphoma Other     ALLERGIES:  has no known allergies.  MEDICATIONS:  Current Outpatient Medications  Medication Sig Dispense Refill   lisinopril  (ZESTRIL ) 10 MG tablet Take 10 mg by mouth daily.     minocycline (DYNACIN) 100 MG tablet Take 100 mg by mouth 2 (two) times daily.     mupirocin  ointment (BACTROBAN ) 2 % Apply 1 Application topically 2 (two) times daily. 22 g 0   ondansetron  (ZOFRAN ) 8 MG tablet Take 8 mg by mouth as needed.     predniSONE  (STERAPRED UNI-PAK 21 TAB) 10 MG (21) TBPK tablet Take 6 tablets on day 1, 5 tablets day 2, 4 tablets day 3, 3 tablets day 4, 2 tablets day 5, 1 tablet day 6 21 tablet 0   propranolol  (INDERAL ) 40 MG tablet Take 40 mg by mouth 2 (two) times daily.     traZODone  (DESYREL ) 50 MG tablet Take 1 tablet (50 mg total) by mouth at bedtime as needed for sleep. 30 tablet 0   No current facility-administered medications for this visit.     PHYSICAL EXAMINATION:  Vitals:   05/15/24 1412  BP: 112/62  Pulse: 61  Resp: 18  Temp: (!) 97.2 F (36.2 C)    Filed Weights   05/15/24 1412  Weight: 164 lb 4.8 oz (74.5 kg)    Physical Exam Constitutional:      General: He is not in acute distress. HENT:     Head: Normocephalic and atraumatic.  Eyes:     General: No scleral icterus.    Pupils: Pupils are equal, round, and reactive to light.  Cardiovascular:     Rate and Rhythm: Normal rate and regular rhythm.  Pulmonary:     Effort: Pulmonary effort is normal. No respiratory distress.  Abdominal:     General: Bowel sounds are normal. There is no distension.  Palpations: Abdomen is soft.  Musculoskeletal:        General: No deformity. Normal range of motion.     Cervical back: Normal range of motion and neck supple.  Skin:    General: Skin is warm and dry.     Coloration: Skin is not pale.     Findings: No erythema or rash.  Neurological:     Mental Status: He is alert and oriented to person, place, and time. Mental status is at baseline.      LABORATORY DATA:  I have reviewed the data as listed    Latest Ref Rng & Units 05/14/2024    3:38 PM 11/04/2023   10:20 AM 09/12/2023    4:29 AM  CBC  WBC 4.0 - 10.5 K/uL 11.7  13.7  10.5   Hemoglobin 13.0 - 17.0 g/dL 87.2  85.9  87.8   Hematocrit 39.0 - 52.0 % 38.9  43.8  37.8   Platelets 150 - 400 K/uL 229  305  236       Latest Ref Rng & Units 11/04/2023   10:20 AM 09/12/2023    4:29 AM 09/11/2023    2:36 PM  CMP  Glucose 70 - 99 mg/dL 888  82  886   BUN 6 - 20 mg/dL 40  31  42   Creatinine 0.61 - 1.24 mg/dL 8.93  9.06  8.83   Sodium 135 - 145 mmol/L 136  135  132   Potassium 3.5 - 5.1 mmol/L 3.8  4.2  5.5   Chloride 98 - 111 mmol/L 101  103  98   CO2 22 - 32 mmol/L 26  26  26    Calcium  8.9 - 10.3 mg/dL 9.4  9.1  9.5   Total Protein 6.5 - 8.1 g/dL 7.3   8.6   Total Bilirubin 0.0 - 1.2 mg/dL 0.5   0.7   Alkaline Phos 38 - 126 U/L 59   87   AST 15 - 41 U/L 18   32   ALT 0 - 44 U/L 12   14    Lab Results  Component Value Date   IRON  105 05/14/2024   TIBC 301  05/14/2024   FERRITIN 158 05/14/2024

## 2024-05-15 NOTE — Assessment & Plan Note (Addendum)
 Labs reviewed and discussed with patient Lab Results  Component Value Date   HGB 12.7 (L) 05/14/2024   TIBC 301 05/14/2024   IRONPCTSAT 35 05/14/2024   FERRITIN 158 05/14/2024   Hemoglobin iron  panel have been stable.  Observation  Will arrange patient to get IV Venofer  weekly x 2

## 2024-05-15 NOTE — Assessment & Plan Note (Addendum)
 He has tried many over-the-counter options. Recommend trial of trazodone  50 mg nightly as needed.  If this helps his symptoms, he plans to further discuss with primary care provider for refills. Recommend patient to avoid caffeine, improve sleep hygiene

## 2024-05-15 NOTE — Assessment & Plan Note (Signed)
history of GERD/esophageal stricture Stable improved symptoms since alcohol cessation.  

## 2024-06-06 ENCOUNTER — Other Ambulatory Visit: Payer: Self-pay | Admitting: Oncology

## 2024-06-06 ENCOUNTER — Encounter: Payer: Self-pay | Admitting: Oncology

## 2024-06-11 ENCOUNTER — Encounter: Payer: Self-pay | Admitting: Oncology

## 2025-05-08 ENCOUNTER — Other Ambulatory Visit

## 2025-05-15 ENCOUNTER — Ambulatory Visit: Admitting: Oncology
